# Patient Record
Sex: Male | Born: 1955
Health system: Southern US, Community
[De-identification: ages and names within clinical notes are randomized; demographics above are authoritative.]

## PROBLEM LIST (undated history)

## (undated) DIAGNOSIS — K056 Periodontal disease, unspecified: Secondary | ICD-10-CM

## (undated) DIAGNOSIS — B20 Human immunodeficiency virus [HIV] disease: Secondary | ICD-10-CM

## (undated) DIAGNOSIS — F3289 Other specified depressive episodes: Secondary | ICD-10-CM

## (undated) DIAGNOSIS — K219 Gastro-esophageal reflux disease without esophagitis: Secondary | ICD-10-CM

## (undated) DIAGNOSIS — I214 Non-ST elevation (NSTEMI) myocardial infarction: Secondary | ICD-10-CM

## (undated) DIAGNOSIS — R42 Dizziness and giddiness: Secondary | ICD-10-CM

## (undated) DIAGNOSIS — Z21 Asymptomatic human immunodeficiency virus [HIV] infection status: Secondary | ICD-10-CM

## (undated) DIAGNOSIS — K644 Residual hemorrhoidal skin tags: Secondary | ICD-10-CM

## (undated) DIAGNOSIS — E291 Testicular hypofunction: Secondary | ICD-10-CM

## (undated) DIAGNOSIS — H6121 Impacted cerumen, right ear: Secondary | ICD-10-CM

## (undated) DIAGNOSIS — N529 Male erectile dysfunction, unspecified: Secondary | ICD-10-CM

## (undated) DIAGNOSIS — R350 Frequency of micturition: Secondary | ICD-10-CM

## (undated) DIAGNOSIS — J309 Allergic rhinitis, unspecified: Secondary | ICD-10-CM

## (undated) DIAGNOSIS — R197 Diarrhea, unspecified: Secondary | ICD-10-CM

## (undated) DIAGNOSIS — L509 Urticaria, unspecified: Secondary | ICD-10-CM

## (undated) DIAGNOSIS — F411 Generalized anxiety disorder: Secondary | ICD-10-CM

## (undated) DIAGNOSIS — K648 Other hemorrhoids: Secondary | ICD-10-CM

## (undated) DIAGNOSIS — Z9861 Coronary angioplasty status: Secondary | ICD-10-CM

## (undated) DIAGNOSIS — K449 Diaphragmatic hernia without obstruction or gangrene: Secondary | ICD-10-CM

## (undated) DIAGNOSIS — F329 Major depressive disorder, single episode, unspecified: Secondary | ICD-10-CM

## (undated) DIAGNOSIS — G609 Hereditary and idiopathic neuropathy, unspecified: Secondary | ICD-10-CM

## (undated) DIAGNOSIS — I2510 Atherosclerotic heart disease of native coronary artery without angina pectoris: Secondary | ICD-10-CM

## (undated) DIAGNOSIS — D126 Benign neoplasm of colon, unspecified: Secondary | ICD-10-CM

## (undated) DIAGNOSIS — J029 Acute pharyngitis, unspecified: Secondary | ICD-10-CM

## (undated) DIAGNOSIS — H11002 Unspecified pterygium of left eye: Secondary | ICD-10-CM

## (undated) HISTORY — DX: Urticaria, unspecified: L50.9

## (undated) HISTORY — DX: Unspecified pterygium of left eye: H11.002

## (undated) HISTORY — DX: Testicular hypofunction: E29.1

## (undated) HISTORY — DX: Diaphragmatic hernia without obstruction or gangrene: K44.9

## (undated) HISTORY — DX: Other hemorrhoids: K64.8

## (undated) HISTORY — DX: Atherosclerotic heart disease of native coronary artery without angina pectoris: I25.10

## (undated) HISTORY — DX: Major depressive disorder, single episode, unspecified: F32.9

## (undated) HISTORY — DX: Gastro-esophageal reflux disease without esophagitis: K21.9

## (undated) HISTORY — DX: Asymptomatic human immunodeficiency virus (hiv) infection status: Z21

## (undated) HISTORY — DX: Diarrhea, unspecified: R19.7

## (undated) HISTORY — DX: Dizziness and giddiness: R42

## (undated) HISTORY — DX: Benign neoplasm of colon, unspecified: D12.6

## (undated) HISTORY — DX: Allergic rhinitis, unspecified: J30.9

## (undated) HISTORY — DX: Male erectile dysfunction, unspecified: N52.9

## (undated) HISTORY — DX: Acute pharyngitis, unspecified: J02.9

## (undated) HISTORY — DX: Other specified depressive episodes: F32.89

## (undated) HISTORY — DX: Frequency of micturition: R35.0

## (undated) HISTORY — DX: Impacted cerumen, right ear: H61.21

## (undated) HISTORY — DX: Generalized anxiety disorder: F41.1

## (undated) HISTORY — DX: Hereditary and idiopathic neuropathy, unspecified: G60.9

## (undated) HISTORY — DX: Human immunodeficiency virus (HIV) disease: B20

## (undated) HISTORY — DX: Residual hemorrhoidal skin tags: K64.4

## (undated) HISTORY — DX: Coronary angioplasty status: Z98.61

## (undated) HISTORY — DX: Non-ST elevation (NSTEMI) myocardial infarction: I21.4

## (undated) HISTORY — DX: Periodontal disease, unspecified: K05.6

---

## 1998-08-27 ENCOUNTER — Emergency Department (HOSPITAL_COMMUNITY): Admission: EM | Admit: 1998-08-27 | Discharge: 1998-08-27 | Payer: Self-pay | Admitting: Emergency Medicine

## 2000-07-29 ENCOUNTER — Emergency Department (HOSPITAL_COMMUNITY): Admission: EM | Admit: 2000-07-29 | Discharge: 2000-07-29 | Payer: Self-pay | Admitting: Emergency Medicine

## 2001-11-04 ENCOUNTER — Ambulatory Visit (HOSPITAL_COMMUNITY): Admission: RE | Admit: 2001-11-04 | Discharge: 2001-11-04 | Payer: Self-pay | Admitting: *Deleted

## 2001-11-04 ENCOUNTER — Encounter: Payer: Self-pay | Admitting: *Deleted

## 2002-02-26 ENCOUNTER — Emergency Department (HOSPITAL_COMMUNITY): Admission: EM | Admit: 2002-02-26 | Discharge: 2002-02-26 | Payer: Self-pay | Admitting: Emergency Medicine

## 2002-02-26 ENCOUNTER — Encounter: Payer: Self-pay | Admitting: Emergency Medicine

## 2002-05-19 ENCOUNTER — Ambulatory Visit (HOSPITAL_COMMUNITY): Admission: RE | Admit: 2002-05-19 | Discharge: 2002-05-19 | Payer: Self-pay | Admitting: Internal Medicine

## 2002-05-19 ENCOUNTER — Encounter: Payer: Self-pay | Admitting: Cardiology

## 2002-10-08 ENCOUNTER — Ambulatory Visit (HOSPITAL_COMMUNITY): Admission: RE | Admit: 2002-10-08 | Discharge: 2002-10-08 | Payer: Self-pay | Admitting: Internal Medicine

## 2002-10-08 ENCOUNTER — Encounter: Payer: Self-pay | Admitting: Internal Medicine

## 2003-12-06 ENCOUNTER — Ambulatory Visit: Payer: Self-pay | Admitting: Internal Medicine

## 2003-12-07 ENCOUNTER — Ambulatory Visit: Payer: Self-pay | Admitting: Internal Medicine

## 2003-12-27 ENCOUNTER — Ambulatory Visit: Payer: Self-pay | Admitting: Internal Medicine

## 2004-02-16 ENCOUNTER — Ambulatory Visit: Payer: Self-pay | Admitting: Internal Medicine

## 2004-05-11 ENCOUNTER — Ambulatory Visit: Payer: Self-pay | Admitting: Internal Medicine

## 2004-05-14 ENCOUNTER — Ambulatory Visit: Payer: Self-pay | Admitting: Internal Medicine

## 2004-05-15 ENCOUNTER — Ambulatory Visit: Payer: Self-pay | Admitting: *Deleted

## 2004-07-13 ENCOUNTER — Ambulatory Visit: Payer: Self-pay | Admitting: Internal Medicine

## 2004-08-14 ENCOUNTER — Ambulatory Visit: Payer: Self-pay | Admitting: Internal Medicine

## 2005-05-22 ENCOUNTER — Ambulatory Visit: Payer: Self-pay | Admitting: Internal Medicine

## 2005-06-20 ENCOUNTER — Ambulatory Visit: Payer: Self-pay | Admitting: Internal Medicine

## 2005-08-15 ENCOUNTER — Ambulatory Visit: Payer: Self-pay | Admitting: Internal Medicine

## 2005-12-05 ENCOUNTER — Ambulatory Visit: Payer: Self-pay | Admitting: Internal Medicine

## 2006-04-24 ENCOUNTER — Ambulatory Visit: Payer: Self-pay | Admitting: Internal Medicine

## 2006-07-07 ENCOUNTER — Ambulatory Visit: Payer: Self-pay | Admitting: Internal Medicine

## 2006-08-04 DIAGNOSIS — J309 Allergic rhinitis, unspecified: Secondary | ICD-10-CM | POA: Insufficient documentation

## 2006-08-04 DIAGNOSIS — F32A Depression, unspecified: Secondary | ICD-10-CM | POA: Insufficient documentation

## 2006-08-04 DIAGNOSIS — G609 Hereditary and idiopathic neuropathy, unspecified: Secondary | ICD-10-CM | POA: Insufficient documentation

## 2006-08-04 DIAGNOSIS — R42 Dizziness and giddiness: Secondary | ICD-10-CM | POA: Insufficient documentation

## 2006-08-04 DIAGNOSIS — K219 Gastro-esophageal reflux disease without esophagitis: Secondary | ICD-10-CM | POA: Insufficient documentation

## 2006-08-04 DIAGNOSIS — F419 Anxiety disorder, unspecified: Secondary | ICD-10-CM | POA: Insufficient documentation

## 2006-08-04 DIAGNOSIS — B20 Human immunodeficiency virus [HIV] disease: Secondary | ICD-10-CM | POA: Insufficient documentation

## 2006-08-04 DIAGNOSIS — F329 Major depressive disorder, single episode, unspecified: Secondary | ICD-10-CM

## 2006-11-12 ENCOUNTER — Encounter (INDEPENDENT_AMBULATORY_CARE_PROVIDER_SITE_OTHER): Payer: Self-pay | Admitting: *Deleted

## 2006-12-11 ENCOUNTER — Ambulatory Visit: Payer: Self-pay | Admitting: Internal Medicine

## 2006-12-12 ENCOUNTER — Encounter: Payer: Self-pay | Admitting: Internal Medicine

## 2006-12-12 LAB — CONVERTED CEMR LAB
ALT: 37 units/L (ref 0–53)
AST: 26 units/L (ref 0–37)
Absolute CD4: 637 #/uL (ref 381–1469)
Albumin: 4.7 g/dL (ref 3.5–5.2)
Alkaline Phosphatase: 94 units/L (ref 39–117)
BUN: 12 mg/dL (ref 6–23)
Basophils Absolute: 0 10*3/uL (ref 0.0–0.1)
Basophils Relative: 0 % (ref 0–1)
CD4 T Helper %: 35 % (ref 32–62)
CO2: 20 meq/L (ref 19–32)
Calcium: 8.9 mg/dL (ref 8.4–10.5)
Chloride: 106 meq/L (ref 96–112)
Creatinine, Ser: 0.93 mg/dL (ref 0.40–1.50)
Eosinophils Absolute: 0.2 10*3/uL (ref 0.0–0.7)
Eosinophils Relative: 3 % (ref 0–5)
Glucose, Bld: 87 mg/dL (ref 70–99)
HCT: 47.7 % (ref 39.0–52.0)
HIV 1 RNA Quant: <50 copies/mL (ref <50)
HIV-1 RNA Quant, Log: <1.7 (ref <1.70)
Hemoglobin: 15.8 g/dL (ref 13.0–17.0)
Lymphocytes Relative: 28 % (ref 12–46)
Lymphs Abs: 1.8 10*3/uL (ref 0.7–3.3)
MCHC: 33.1 g/dL (ref 30.0–36.0)
MCV: 96 fL (ref 78.0–100.0)
Monocytes Absolute: 0.5 10*3/uL (ref 0.2–0.7)
Monocytes Relative: 8 % (ref 3–11)
Neutro Abs: 3.9 10*3/uL (ref 1.7–7.7)
Neutrophils Relative %: 60 % (ref 43–77)
Platelets: 194 10*3/uL (ref 150–400)
Potassium: 4.4 meq/L (ref 3.5–5.3)
RBC: 4.97 M/uL (ref 4.22–5.81)
RDW: 13.2 % (ref 11.5–14.0)
Sodium: 139 meq/L (ref 135–145)
Total Bilirubin: 0.3 mg/dL (ref 0.3–1.2)
Total Lymphocytes %: 28 % (ref 12–46)
Total Protein: 7.5 g/dL (ref 6.0–8.3)
Total lymphocyte count: 1820 cells/mcL (ref 700–3300)
WBC, lymph enumeration: 6.5 10*3/uL (ref 4.0–10.5)
WBC: 6.5 10*3/uL (ref 4.0–10.5)

## 2007-03-09 ENCOUNTER — Ambulatory Visit: Payer: Self-pay | Admitting: Internal Medicine

## 2007-03-23 ENCOUNTER — Encounter: Payer: Self-pay | Admitting: Internal Medicine

## 2007-03-23 ENCOUNTER — Ambulatory Visit: Payer: Self-pay | Admitting: Internal Medicine

## 2007-03-23 LAB — CONVERTED CEMR LAB
ALT: 31 units/L (ref 0–53)
AST: 23 units/L (ref 0–37)
Absolute CD4: 696 #/uL (ref 381–1469)
Albumin: 4.7 g/dL (ref 3.5–5.2)
Alkaline Phosphatase: 106 units/L (ref 39–117)
BUN: 19 mg/dL (ref 6–23)
Basophils Absolute: 0 10*3/uL (ref 0.0–0.1)
Basophils Relative: 0 % (ref 0–1)
CD4 T Helper %: 36 % (ref 32–62)
CO2: 24 meq/L (ref 19–32)
Calcium: 9.3 mg/dL (ref 8.4–10.5)
Chloride: 104 meq/L (ref 96–112)
Cholesterol: 186 mg/dL (ref 0–200)
Creatinine, Ser: 0.97 mg/dL (ref 0.40–1.50)
Eosinophils Absolute: 0.3 10*3/uL (ref 0.0–0.7)
Eosinophils Relative: 4 % (ref 0–5)
Glucose, Bld: 94 mg/dL (ref 70–99)
HCT: 47.6 % (ref 39.0–52.0)
HDL: 47 mg/dL (ref >39)
Hemoglobin: 15.4 g/dL (ref 13.0–17.0)
LDL Cholesterol: 115 mg/dL — ABNORMAL HIGH (ref 0–99)
Lymphocytes Relative: 28 % (ref 12–46)
Lymphs Abs: 2 10*3/uL (ref 0.7–4.0)
MCHC: 32.4 g/dL (ref 30.0–36.0)
MCV: 96.9 fL (ref 78.0–100.0)
Monocytes Absolute: 0.6 10*3/uL (ref 0.1–1.0)
Monocytes Relative: 9 % (ref 3–12)
Neutro Abs: 4.1 10*3/uL (ref 1.7–7.7)
Neutrophils Relative %: 59 % (ref 43–77)
Platelets: 196 10*3/uL (ref 150–400)
Potassium: 4 meq/L (ref 3.5–5.3)
RBC: 4.91 M/uL (ref 4.22–5.81)
RDW: 12.8 % (ref 11.5–15.5)
Sodium: 138 meq/L (ref 135–145)
Testosterone: 249.54 ng/dL — ABNORMAL LOW (ref 350–890)
Total Bilirubin: 0.3 mg/dL (ref 0.3–1.2)
Total CHOL/HDL Ratio: 4
Total Lymphocytes %: 28 % (ref 12–46)
Total Protein: 7.5 g/dL (ref 6.0–8.3)
Total lymphocyte count: 1932 cells/mcL (ref 700–3300)
Triglycerides: 121 mg/dL (ref <150)
VLDL: 24 mg/dL (ref 0–40)
WBC, lymph enumeration: 6.9 10*3/uL (ref 4.0–10.5)
WBC: 6.9 10*3/uL (ref 4.0–10.5)

## 2007-03-26 ENCOUNTER — Encounter: Payer: Self-pay | Admitting: Internal Medicine

## 2007-03-26 LAB — CONVERTED CEMR LAB
HIV 1 RNA Quant: <50 copies/mL (ref <50)
HIV-1 RNA Quant, Log: <1.7 (ref <1.70)

## 2007-07-30 ENCOUNTER — Ambulatory Visit: Payer: Self-pay | Admitting: Internal Medicine

## 2007-07-30 ENCOUNTER — Encounter: Payer: Self-pay | Admitting: Internal Medicine

## 2007-07-30 LAB — CONVERTED CEMR LAB
ALT: 29 units/L (ref 0–53)
Absolute CD4: 559 #/uL (ref 381–1469)
BUN: 17 mg/dL (ref 6–23)
Basophils Absolute: 0 10*3/uL (ref 0.0–0.1)
CD4 T Helper %: 37 % (ref 32–62)
CO2: 22 meq/L (ref 19–32)
Calcium: 10 mg/dL (ref 8.4–10.5)
Chloride: 111 meq/L (ref 96–112)
Creatinine, Ser: 0.99 mg/dL (ref 0.40–1.50)
Eosinophils Relative: 3 % (ref 0–5)
Glucose, Bld: 97 mg/dL (ref 70–99)
Hep B S Ab: POSITIVE — AB
Hepatitis B Surface Ag: NEGATIVE
MCHC: 33.5 g/dL (ref 30.0–36.0)
MCV: 94.8 fL (ref 78.0–100.0)
Monocytes Absolute: 0.5 10*3/uL (ref 0.1–1.0)
Monocytes Relative: 8 % (ref 3–12)
Neutrophils Relative %: 65 % (ref 43–77)
PSA: 1.66 ng/mL (ref 0.10–4.00)
Platelets: 171 10*3/uL (ref 150–400)
Testosterone: 249.75 ng/dL — ABNORMAL LOW (ref 350–890)
Total Bilirubin: 0.3 mg/dL (ref 0.3–1.2)
WBC, lymph enumeration: 6.3 10*3/uL (ref 4.0–10.5)
WBC: 6.3 10*3/uL (ref 4.0–10.5)

## 2007-09-10 ENCOUNTER — Ambulatory Visit: Payer: Self-pay | Admitting: Internal Medicine

## 2007-12-10 ENCOUNTER — Ambulatory Visit: Payer: Self-pay | Admitting: Internal Medicine

## 2007-12-10 LAB — CONVERTED CEMR LAB
ALT: 26 units/L (ref 0–53)
Absolute CD4: 638 #/uL (ref 381–1469)
Basophils Absolute: 0 10*3/uL (ref 0.0–0.1)
CO2: 24 meq/L (ref 19–32)
Calcium: 9.3 mg/dL (ref 8.4–10.5)
Chloride: 106 meq/L (ref 96–112)
Creatinine, Ser: 0.94 mg/dL (ref 0.40–1.50)
Eosinophils Relative: 3 % (ref 0–5)
Glucose, Bld: 92 mg/dL (ref 70–99)
HCT: 45.1 % (ref 39.0–52.0)
HIV 1 RNA Quant: <50 copies/mL (ref <50)
Hemoglobin: 15.1 g/dL (ref 13.0–17.0)
Lymphocytes Relative: 23 % (ref 12–46)
Lymphs Abs: 1.7 10*3/uL (ref 0.7–4.0)
Monocytes Absolute: 0.6 10*3/uL (ref 0.1–1.0)
Neutro Abs: 4.8 10*3/uL (ref 1.7–7.7)
PSA: 2.19 ng/mL (ref 0.10–4.00)
RBC: 4.65 M/uL (ref 4.22–5.81)
RDW: 12.6 % (ref 11.5–15.5)
Total Bilirubin: 0.3 mg/dL (ref 0.3–1.2)
Total Protein: 7.5 g/dL (ref 6.0–8.3)
WBC, lymph enumeration: 7.3 10*3/uL (ref 4.0–10.5)
WBC: 7.3 10*3/uL (ref 4.0–10.5)

## 2008-07-01 ENCOUNTER — Telehealth: Payer: Self-pay | Admitting: Internal Medicine

## 2008-09-15 ENCOUNTER — Encounter: Payer: Self-pay | Admitting: Internal Medicine

## 2008-09-15 ENCOUNTER — Ambulatory Visit: Payer: Self-pay | Admitting: Internal Medicine

## 2008-09-15 DIAGNOSIS — K644 Residual hemorrhoidal skin tags: Secondary | ICD-10-CM | POA: Insufficient documentation

## 2008-09-15 DIAGNOSIS — R197 Diarrhea, unspecified: Secondary | ICD-10-CM | POA: Insufficient documentation

## 2008-10-06 ENCOUNTER — Encounter: Payer: Self-pay | Admitting: Internal Medicine

## 2008-10-06 ENCOUNTER — Ambulatory Visit: Payer: Self-pay | Admitting: Internal Medicine

## 2008-10-06 DIAGNOSIS — J029 Acute pharyngitis, unspecified: Secondary | ICD-10-CM | POA: Insufficient documentation

## 2008-10-20 ENCOUNTER — Ambulatory Visit: Payer: Self-pay | Admitting: Internal Medicine

## 2008-11-09 ENCOUNTER — Ambulatory Visit: Payer: Self-pay | Admitting: Internal Medicine

## 2008-12-15 ENCOUNTER — Telehealth: Payer: Self-pay | Admitting: Internal Medicine

## 2009-01-17 ENCOUNTER — Ambulatory Visit: Payer: Self-pay | Admitting: Internal Medicine

## 2009-03-16 ENCOUNTER — Encounter: Payer: Self-pay | Admitting: Internal Medicine

## 2009-03-16 ENCOUNTER — Ambulatory Visit: Payer: Self-pay | Admitting: Internal Medicine

## 2009-03-16 DIAGNOSIS — R350 Frequency of micturition: Secondary | ICD-10-CM | POA: Insufficient documentation

## 2009-03-20 LAB — CONVERTED CEMR LAB
ALT: 47 units/L (ref 0–53)
Albumin: 4.8 g/dL (ref 3.5–5.2)
CD4 T Helper %: 37 % (ref 32–62)
CO2: 23 meq/L (ref 19–32)
Calcium: 9.1 mg/dL (ref 8.4–10.5)
Chlamydia, Swab/Urine, PCR: NEGATIVE
Chloride: 107 meq/L (ref 96–112)
Cholesterol: 201 mg/dL — ABNORMAL HIGH (ref 0–200)
HIV 1 RNA Quant: <48 copies/mL (ref <48)
HIV-1 RNA Quant, Log: <1.68 (ref <1.68)
Hemoglobin: 16.4 g/dL (ref 13.0–17.0)
Lymphocytes Relative: 26 % (ref 12–46)
Lymphs Abs: 1.7 10*3/uL (ref 0.7–4.0)
Monocytes Absolute: 0.6 10*3/uL (ref 0.1–1.0)
Monocytes Relative: 10 % (ref 3–12)
Neutro Abs: 4 10*3/uL (ref 1.7–7.7)
Neutrophils Relative %: 62 % (ref 43–77)
PSA: 2.13 ng/mL (ref 0.10–4.00)
Potassium: 4.6 meq/L (ref 3.5–5.3)
RBC: 5.17 M/uL (ref 4.22–5.81)
Sodium: 141 meq/L (ref 135–145)
Total Bilirubin: 0.3 mg/dL (ref 0.3–1.2)
Total Protein: 7.8 g/dL (ref 6.0–8.3)
VLDL: 18 mg/dL (ref 0–40)
WBC: 6.5 10*3/uL (ref 4.0–10.5)

## 2009-03-21 ENCOUNTER — Telehealth: Payer: Self-pay | Admitting: Internal Medicine

## 2009-03-23 ENCOUNTER — Ambulatory Visit: Payer: Self-pay | Admitting: Internal Medicine

## 2009-03-24 ENCOUNTER — Encounter: Payer: Self-pay | Admitting: Internal Medicine

## 2009-04-05 ENCOUNTER — Telehealth: Payer: Self-pay | Admitting: Internal Medicine

## 2009-05-10 ENCOUNTER — Ambulatory Visit: Payer: Self-pay | Admitting: Internal Medicine

## 2009-05-11 ENCOUNTER — Ambulatory Visit (HOSPITAL_COMMUNITY): Admission: RE | Admit: 2009-05-11 | Discharge: 2009-05-11 | Payer: Self-pay | Admitting: Internal Medicine

## 2009-05-16 ENCOUNTER — Ambulatory Visit (HOSPITAL_COMMUNITY): Admission: RE | Admit: 2009-05-16 | Discharge: 2009-05-16 | Payer: Self-pay | Admitting: Internal Medicine

## 2009-05-16 IMAGING — CR DG CHEST 2V
3 series · 3 of 3 positions shown · non-contrast
Comparison: Chest [DATE].

CLINICAL DATA: Question pulmonary nodule.

CHEST - 2 VIEW

[w chest pa]
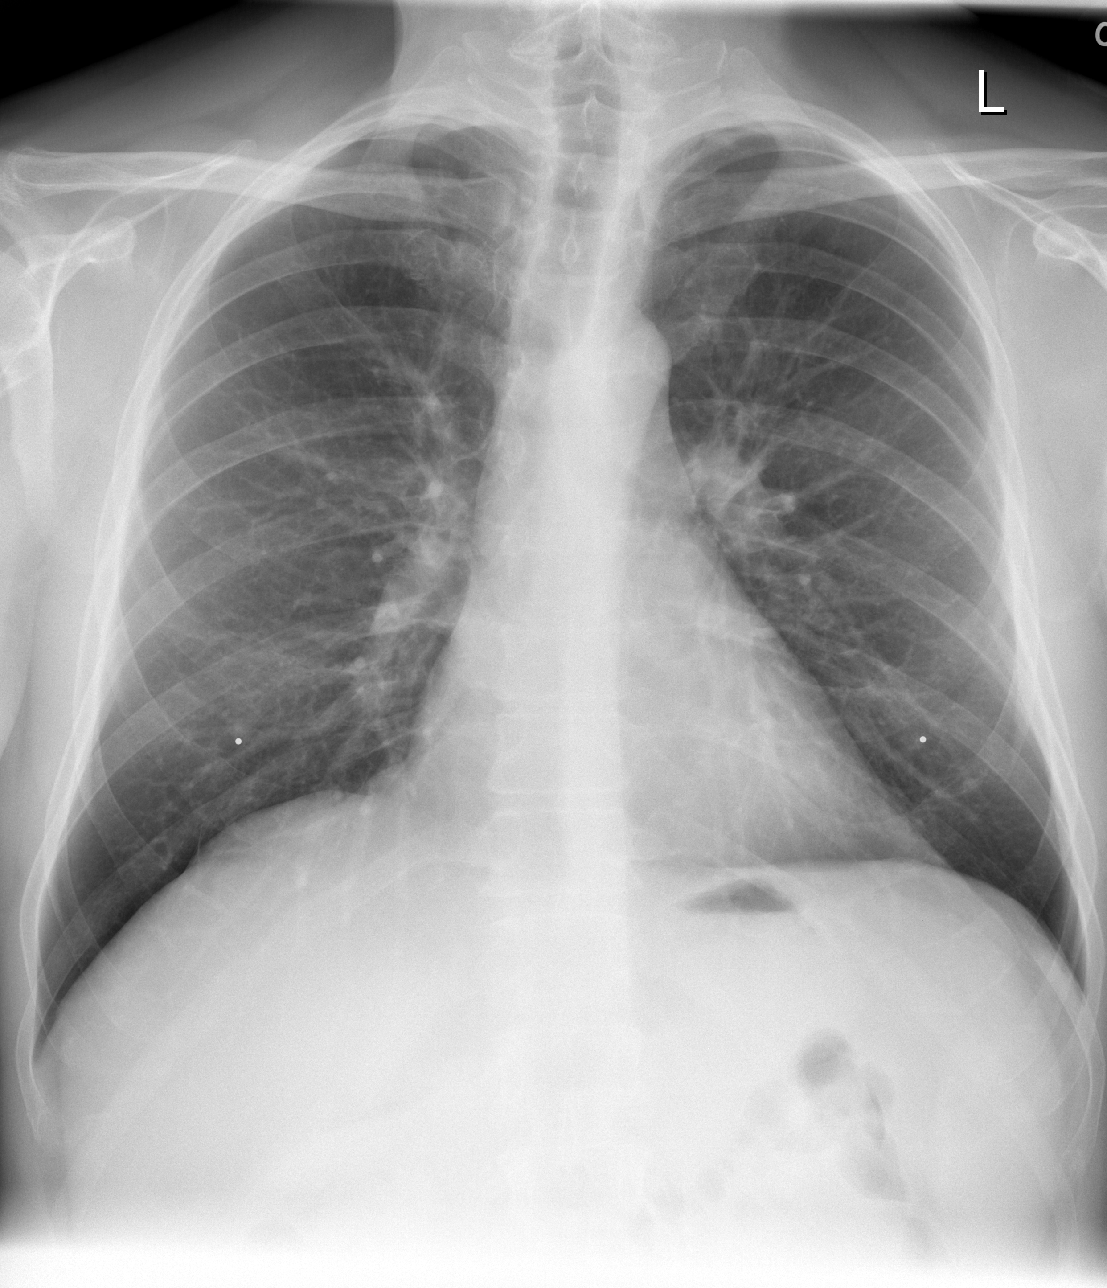

[w chest lat (1 of 2)]
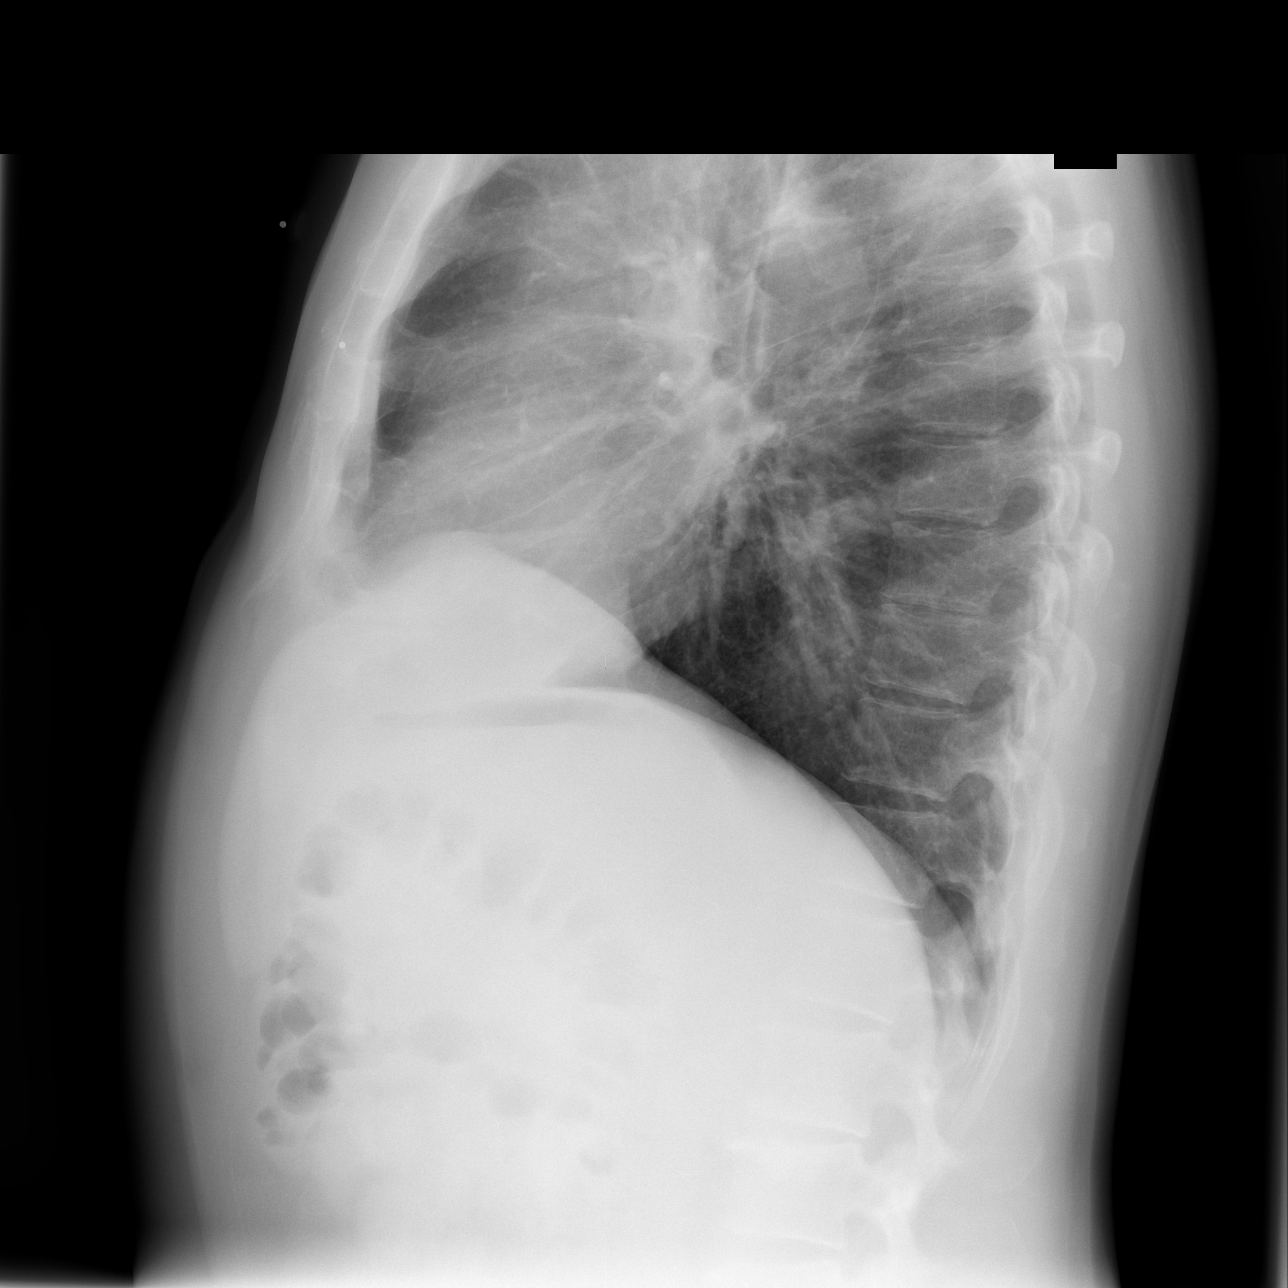

[w chest lat (2 of 2)]
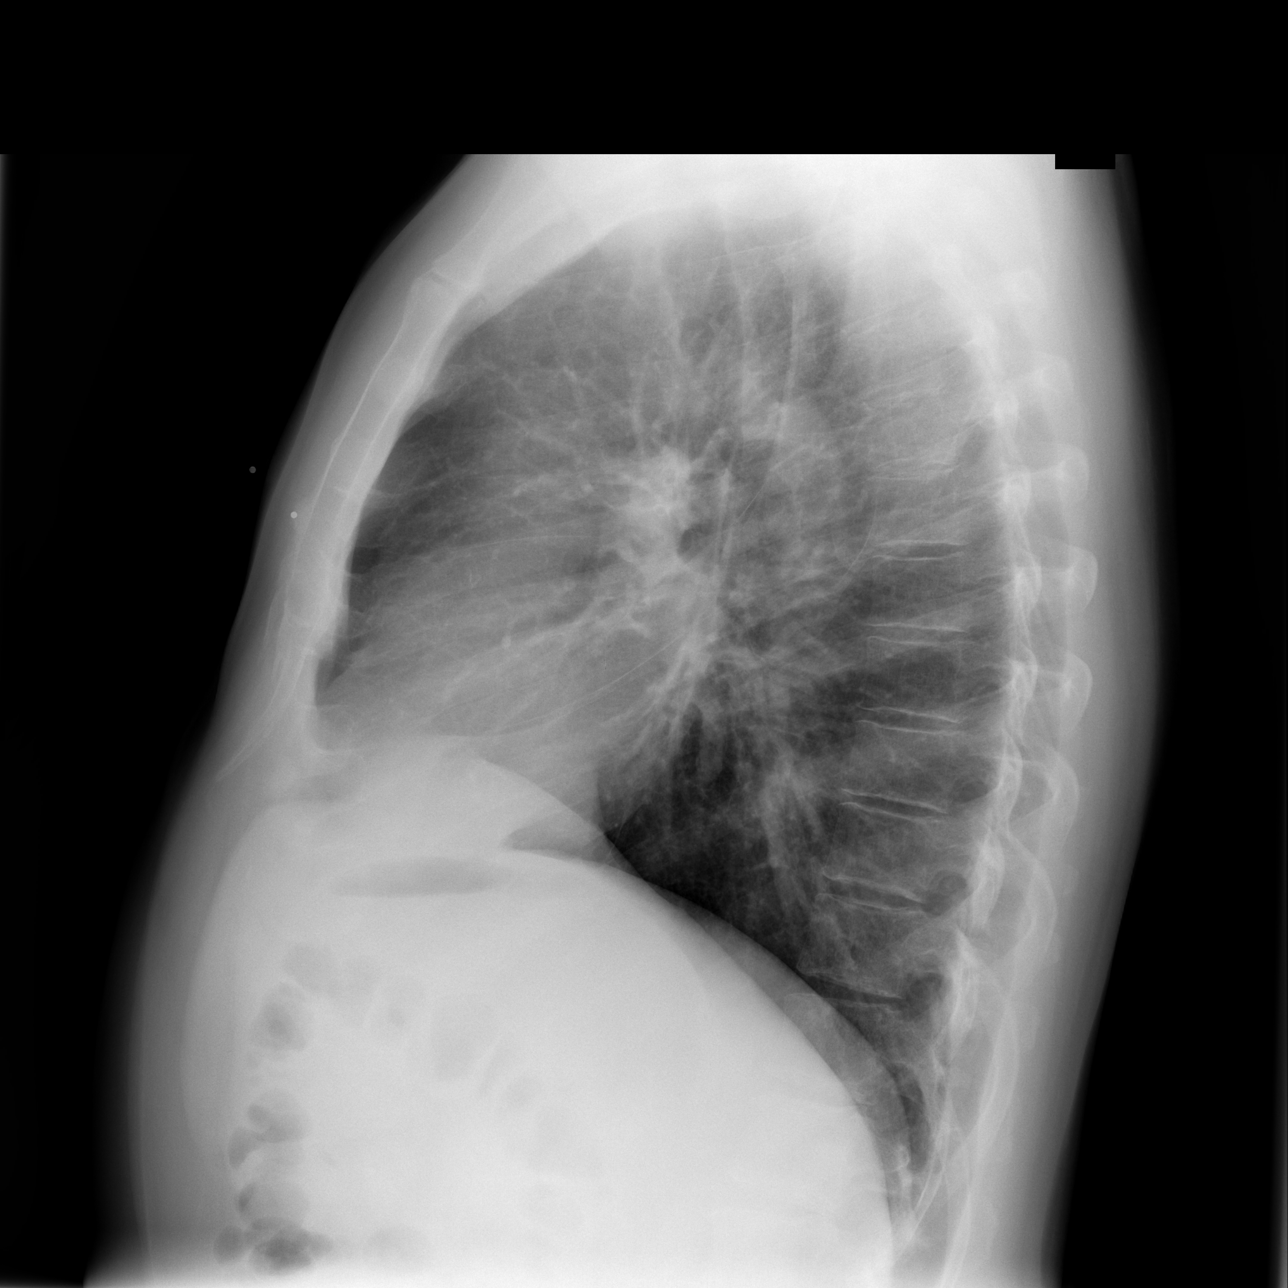

[3 of 3 positions shown; findings below may reference images not displayed]

FINDINGS: Repeat films taken with nipple marker in place.  Nodular
opacities seen on prior plain films are the patient's nipples.  No
pulmonary nodule is identified.
IMPRESSION: Negative for pulmonary nodule.

## 2009-06-08 ENCOUNTER — Telehealth (INDEPENDENT_AMBULATORY_CARE_PROVIDER_SITE_OTHER): Payer: Self-pay | Admitting: *Deleted

## 2009-07-05 ENCOUNTER — Telehealth: Payer: Self-pay | Admitting: Internal Medicine

## 2009-07-17 ENCOUNTER — Encounter (INDEPENDENT_AMBULATORY_CARE_PROVIDER_SITE_OTHER): Payer: Self-pay | Admitting: *Deleted

## 2009-08-14 ENCOUNTER — Telehealth: Payer: Self-pay | Admitting: Internal Medicine

## 2009-08-31 ENCOUNTER — Telehealth: Payer: Self-pay | Admitting: Internal Medicine

## 2009-09-01 ENCOUNTER — Encounter: Payer: Self-pay | Admitting: Internal Medicine

## 2009-09-06 ENCOUNTER — Ambulatory Visit: Payer: Self-pay | Admitting: Internal Medicine

## 2009-09-06 LAB — CONVERTED CEMR LAB
ALT: 20 units/L (ref 0–53)
AST: 17 units/L (ref 0–37)
Basophils Absolute: 0 10*3/uL (ref 0.0–0.1)
Basophils Relative: 0 % (ref 0–1)
Chloride: 106 meq/L (ref 96–112)
Creatinine, Ser: 1.01 mg/dL (ref 0.40–1.50)
Eosinophils Relative: 3 % (ref 0–5)
Hemoglobin: 14.8 g/dL (ref 13.0–17.0)
LDL Cholesterol: 101 mg/dL — ABNORMAL HIGH (ref 0–99)
MCHC: 32.7 g/dL (ref 30.0–36.0)
Monocytes Absolute: 0.6 10*3/uL (ref 0.1–1.0)
Neutro Abs: 3.5 10*3/uL (ref 1.7–7.7)
RDW: 13.4 % (ref 11.5–15.5)
Testosterone: 299.75 ng/dL — ABNORMAL LOW (ref 350–890)
Total Bilirubin: 0.3 mg/dL (ref 0.3–1.2)
Total CHOL/HDL Ratio: 4.3
VLDL: 32 mg/dL (ref 0–40)

## 2009-09-20 ENCOUNTER — Ambulatory Visit: Payer: Self-pay | Admitting: Internal Medicine

## 2009-09-20 DIAGNOSIS — F411 Generalized anxiety disorder: Secondary | ICD-10-CM | POA: Insufficient documentation

## 2009-09-20 DIAGNOSIS — E349 Endocrine disorder, unspecified: Secondary | ICD-10-CM | POA: Insufficient documentation

## 2009-09-26 ENCOUNTER — Telehealth: Payer: Self-pay | Admitting: Internal Medicine

## 2009-10-27 ENCOUNTER — Telehealth: Payer: Self-pay | Admitting: Internal Medicine

## 2009-11-29 ENCOUNTER — Telehealth (INDEPENDENT_AMBULATORY_CARE_PROVIDER_SITE_OTHER): Payer: Self-pay | Admitting: *Deleted

## 2009-12-26 ENCOUNTER — Telehealth (INDEPENDENT_AMBULATORY_CARE_PROVIDER_SITE_OTHER): Payer: Self-pay | Admitting: *Deleted

## 2010-01-15 ENCOUNTER — Encounter (INDEPENDENT_AMBULATORY_CARE_PROVIDER_SITE_OTHER): Payer: Self-pay | Admitting: *Deleted

## 2010-01-25 ENCOUNTER — Telehealth (INDEPENDENT_AMBULATORY_CARE_PROVIDER_SITE_OTHER): Payer: Self-pay | Admitting: *Deleted

## 2010-03-08 ENCOUNTER — Ambulatory Visit: Admit: 2010-03-08 | Payer: Self-pay | Admitting: Internal Medicine

## 2010-03-20 ENCOUNTER — Telehealth: Payer: Self-pay | Admitting: *Deleted

## 2010-03-27 ENCOUNTER — Ambulatory Visit
Admission: RE | Admit: 2010-03-27 | Discharge: 2010-03-27 | Payer: Self-pay | Source: Home / Self Care | Attending: Internal Medicine | Admitting: Internal Medicine

## 2010-03-27 ENCOUNTER — Encounter: Payer: Self-pay | Admitting: Adult Health

## 2010-03-27 LAB — CONVERTED CEMR LAB
ALT: 36 units/L (ref 0–53)
BUN: 18 mg/dL (ref 6–23)
CO2: 25 meq/L (ref 19–32)
Calcium: 9.3 mg/dL (ref 8.4–10.5)
Chloride: 104 meq/L (ref 96–112)
Creatinine, Ser: 0.94 mg/dL (ref 0.40–1.50)
Eosinophils Relative: 2 % (ref 0–5)
HCT: 46.5 % (ref 39.0–52.0)
HIV-1 RNA Quant, Log: <1.3 (ref <1.30)
Hemoglobin: 15.2 g/dL (ref 13.0–17.0)
Lymphocytes Relative: 29 % (ref 12–46)
Lymphs Abs: 1.9 10*3/uL (ref 0.7–4.0)
Neutro Abs: 3.9 10*3/uL (ref 1.7–7.7)
Platelets: 181 10*3/uL (ref 150–400)
Testosterone: 248.71 ng/dL — ABNORMAL LOW (ref 250–890)
WBC: 6.5 10*3/uL (ref 4.0–10.5)

## 2010-03-29 NOTE — Progress Notes (Signed)
Summary: [facility]AP/pt assist meds arrived for Jul  Phone Note Refill Request      Prescriptions: SUSTIVA 600 MG TABS (EFAVIRENZ) Take 1 tablet by mouth at bedtime  #30 x 0   Entered by:   Paulo Fruit  BS,CPht II,MPH   Authorized by:   Yisroel Ramming MD   Signed by:   Paulo Fruit  BS,CPht II,MPH on 08/31/2009   Method used:   Samples Given   RxID:   [id number] VIREAD 300 MG TABS (TENOFOVIR DISOPROXIL FUMARATE) Take 1 tablet by mouth once a day  #30 x 0   Entered by:   Paulo Fruit  BS,CPht II,MPH   Authorized by:   Yisroel Ramming MD   Signed by:   Paulo Fruit  BS,CPht II,MPH on 08/31/2009   Method used:   Samples Given   RxID:   [id number] EPIVIR 300 MG TABS (LAMIVUDINE) Take 1 tablet by mouth once a day  #30 x 0   Entered by:   Paulo Fruit  BS,CPht II,MPH   Authorized by:   Yisroel Ramming MD   Signed by:   Paulo Fruit  BS,CPht II,MPH on 08/31/2009   Method used:   Samples Given   RxID:   [id number]  Patient Assist Medication Verification: Medication name:Epivir 300mg  RX # [id number] Tech approval:MLD  Patient Assist Medication Verification: Medication name:Viread 300mg  RX # [id number] Tech approval:MLD  Patient Assist Medication Verification: Medication name: Sustiva 600mg  RX # [id number] Tech approval:MLD Call placed to patient with message that assistance medications are ready for pick-up. Paulo Fruit  BS,CPht II,MPH  August 31, 2009 10:49 AM

## 2010-03-29 NOTE — Progress Notes (Signed)
Summary: NCADAP meds arrived for Aug  Phone Note Refill Request      Prescriptions: SUSTIVA 600 MG TABS (EFAVIRENZ) Take 1 tablet by mouth at bedtime  #30 x 0   Entered by:   Paulo Fruit  BS,CPht II,MPH   Authorized by:   Yisroel Ramming MD   Signed by:   Paulo Fruit  BS,CPht II,MPH on 09/26/2009   Method used:   Samples Given   RxID:   [id number] VIREAD 300 MG TABS (TENOFOVIR DISOPROXIL FUMARATE) Take 1 tablet by mouth once a day  #30 x 0   Entered by:   Paulo Fruit  BS,CPht II,MPH   Authorized by:   Yisroel Ramming MD   Signed by:   Paulo Fruit  BS,CPht II,MPH on 09/26/2009   Method used:   Samples Given   RxID:   [id number] EPIVIR 300 MG TABS (LAMIVUDINE) Take 1 tablet by mouth once a day  #30 x 0   Entered by:   Paulo Fruit  BS,CPht II,MPH   Authorized by:   Yisroel Ramming MD   Signed by:   Paulo Fruit  BS,CPht II,MPH on 09/26/2009   Method used:   Samples Given   RxID:   [id number]  Patient Assist Medication Verification: Medication name: Viread 300mg  RX # [id number] Tech approval:MLD  Patient Assist Medication Verification: Medication name:Sustiva 600mg  RX # [id number] Tech approval:MLD  Patient Assist Medication Verification: Medication name:Epivir 300mg  RX # [id number] Tech approval:MLD Call placed to patient with message that assistance medications are ready for pick-up. Paulo Fruit  BS,CPht II,MPH  September 26, 2009 2:50 PM

## 2010-03-29 NOTE — Progress Notes (Signed)
Summary: phone call re: labs  Phone Note Outgoing Call   Summary of Call: ML on VM to call. 530p Pt. called and advised of Testosterone level. Pt. requested results of UA from last week and none found. Pt. will come in on 1/27 at 330 pm at no charge for a UA. Initial call taken by: Sharen Heck RN,  March 21, 2009 1:17 PM

## 2010-03-29 NOTE — Progress Notes (Signed)
   Phone Note Outgoing Call   Call placed by: Golden Circle RN,  March 20, 2010 1:46 PM Call placed to: pt Action Taken: Assistance medications ready for pick up    Prescriptions: SUSTIVA 600 MG TABS (EFAVIRENZ) Take 1 tablet by mouth at bedtime  #30 x 0   Entered by:   Golden Circle RN   Authorized by:   Johny Sax MD   Signed by:   Golden Circle RN on 03/20/2010   Method used:   Samples Given   RxID:   [id number] VIREAD 300 MG TABS (TENOFOVIR DISOPROXIL FUMARATE) Take 1 tablet by mouth once a day  #30 x 0   Entered by:   Golden Circle RN   Authorized by:   Johny Sax MD   Signed by:   Golden Circle RN on 03/20/2010   Method used:   Samples Given   RxID:   [id number] EPIVIR 300 MG TABS (LAMIVUDINE) Take 1 tablet by mouth once a day  #30 x 0   Entered by:   Golden Circle RN   Authorized by:   Johny Sax MD   Signed by:   Golden Circle RN on 03/20/2010   Method used:   Samples Given   RxID:   [id number]  spoke with pt. he will pick up meds at end of week. told him that [facility] will have all meds in future. he just needs to call a wk ahead & pick them up there.Golden Circle RN  March 20, 2010 1:48 PM

## 2010-03-29 NOTE — Miscellaneous (Signed)
Summary: Orders Update - Labs  Clinical Lists Changes  Problems: Added new problem of ENCOUNTER FOR LONG-TERM USE OF OTHER MEDICATIONS (ICD-V58.69) Orders: Added new Test order of T-CBC w/Diff [phone]) - Signed Added new Test order of T-CD4SP [facility]) (CD4SP) - Signed Added new Test order of T-Comprehensive Metabolic Panel [phone]) - Signed Added new Test order of T-HIV Viral Load [phone]) - Signed Added new Test order of T-RPR (Syphilis) [phone]) - Signed Added new Test order of T-Lipid Profile ([id number]) - Signed     Process Orders Check Orders Results:     [facility]: ABN not required for this insurance Order queued for requisitioning for [facility]: September 01, 2009 4:21 PM  Tests Sent for requisitioning (September 01, 2009 4:21 PM):     09/06/2009: [facility] -- T-CBC w/Diff [[id number]] (signed)     09/06/2009: [facility] -- T-Comprehensive Metabolic Panel [[id number]] (signed)     09/06/2009: [facility] -- T-HIV Viral Load [id number] (signed)     09/06/2009: [facility] -- T-RPR (Syphilis) [phone] (signed)     09/06/2009: [facility] -- T-Lipid Profile [phone] (signed)

## 2010-03-29 NOTE — Progress Notes (Signed)
Summary: Pt. notified that ADAP are ready for pick up  Phone Note Outgoing Call   Call placed by: Jennet Maduro RN,  January 25, 2010 2:15 PM Call placed to: Patient Action Taken: Assistance medications ready for pick up Summary of Call: Pt. notified that ADAP are ready for pick up. Jennet Maduro RN  January 25, 2010 2:16 PM     Prescriptions: SUSTIVA 600 MG TABS (EFAVIRENZ) Take 1 tablet by mouth at bedtime  #30 x 0   Entered by:   Jennet Maduro RN   Authorized by:   Yisroel Ramming MD   Signed by:   Jennet Maduro RN on 01/25/2010   Method used:   Samples Given   RxID:   [id number] VIREAD 300 MG TABS (TENOFOVIR DISOPROXIL FUMARATE) Take 1 tablet by mouth once a day  #30 x 0   Entered by:   Jennet Maduro RN   Authorized by:   Yisroel Ramming MD   Signed by:   Jennet Maduro RN on 01/25/2010   Method used:   Samples Given   RxID:   [id number] EPIVIR 300 MG TABS (LAMIVUDINE) Take 1 tablet by mouth once a day  #30 x 0   Entered by:   Jennet Maduro RN   Authorized by:   Yisroel Ramming MD   Signed by:   Jennet Maduro RN on 01/25/2010   Method used:   Samples Given   RxID:   [id number]

## 2010-03-29 NOTE — Miscellaneous (Signed)
Summary: Office Visit ([facility])    Vital Signs:  Patient profile:   55 year old male Weight:      178.5 pounds Temp:     97.9 degrees F oral Pulse rate:   73 / minute Pulse rhythm:   regular Resp:     18 per minute BP sitting:   131 / 85  (left arm)  Vitals Entered By: Sharen Heck RN (March 16, 2009 10:10 AM) CC: / f/u 05, feels swimmy headed today Is Patient Diabetic? No Pain Assessment Patient in pain? no       Does patient need assistance? Functional Status Self care Ambulation Normal   CC:  / f/u 05 and feels swimmy headed today.  History of Present Illness: Pt continues to have GERD, blood in his stool which he thinks is due to hemorrhoids and intermittant diarrhea.  He is ready to be referred to GI. He also c/o some burning on urination and burning in his urethra.  He has not had unprotected sex. Taking his meds every day.  Preventive Screening-Counseling & Management  Alcohol-Tobacco     Alcohol drinks/day: 0     Smoking Status: quit  Caffeine-Diet-Exercise     Caffeine use/day: 0     Does Patient Exercise: yes     Type of exercise: yard work     Exercise (avg: min/session): 30-60     Times/week: 3  Current Problems (verified): 1)  Pharyngitis, Recurrent  (ICD-462) 2)  Diarrhea  (ICD-787.91) 3)  External Hemorrhoids Without Mention Comp  (ICD-455.3) 4)  Vertigo  (ICD-780.4) 5)  Depression  (ICD-311) 6)  Peripheral Neuropathy  (ICD-356.9) 7)  HIV Disease  (ICD-042) 8)  Gerd  (ICD-530.81) 9)  Allergic Rhinitis  (ICD-477.9)  Current Medications (verified): 1)  Epivir 300 Mg Tabs (Lamivudine) .... Take 1 Tablet By Mouth Once A Day 2)  Viread 300 Mg Tabs (Tenofovir Disoproxil Fumarate) .... Take 1 Tablet By Mouth Once A Day 3)  Sustiva 600 Mg Tabs (Efavirenz) .... Take 1 Tablet By Mouth At Bedtime 4)  Protonix 40 Mg Tbec (Pantoprazole Sodium) .... Take 1 Tablet By Mouth Once A Day 5)  Allegra 180 Mg Tabs (Fexofenadine Hcl) 6)  Anusol-Hc 25  Mg Supp (Hydrocortisone Acetate) .... One Per Rectum Once Daily  Allergies: 1)  ! Sulfa 2)  ! * Dapsone 3)  ! Codeine 4)  ! Cipro   Review of Systems  The patient denies anorexia, fever, weight loss, abdominal pain, and melena.     Physical Exam  General:  alert, well-developed, well-nourished, and well-hydrated.   Head:  normocephalic and atraumatic.   Mouth:  pharynx pink and moist.  no thrush     Impression & Recommendations:  Problem # 1:  HIV DISEASE (ICD-042) Will obtain labs today.  Pt to continue current meds. Influenza vaccine given. Diagnostics Reviewed:  WBC: 7.3 (12/10/2007)   Hgb: 15.1 (12/10/2007)   HCT: 45.1 (12/10/2007)   Platelets: 223 (12/10/2007) HIV-1 RNA: <50 copies/mL (12/10/2007)   HBSAg: NEG (07/30/2007)  Orders: Est. Patient Level III (78295) T-CBC w/Diff [phone]) T-CD4SP ([facility]) (CD4SP) T-Chlamydia  Probe, urine [phone]) T-GC Probe, urine [phone]) T-Comprehensive Metabolic Panel [phone]) T-HIV Viral Load (74259-56387) T-Testosterone; Total [phone]) T-Urinalysis [phone]) T-PSA [phone]) T-Lipid Profile [phone])  Problem # 2:  DIARRHEA (ICD-787.91) refer to GI.  Problem # 3:  URINARY FREQUENCY (ICD-788.41) check UA and  gc, chlamydia

## 2010-03-29 NOTE — Miscellaneous (Signed)
  Clinical Lists Changes  Observations: Added new observation of YEARAIDSPOS: 2004  (01/15/2010 10:41)

## 2010-03-29 NOTE — Progress Notes (Signed)
Summary: med refill  Phone Note Refill Request Message from:  Fax from Pharmacy on April 05, 2009 4:39 PM  Refills Requested: Medication #1:  ANUSOL-HC 25 MG SUPP one per rectum once daily.   Dosage confirmed as above?Dosage Confirmed   Brand Name Necessary? No   Supply Requested: 1 month  Method Requested: Telephone to Pharmacy Initial call taken by: Sharen Heck RN,  April 05, 2009 4:39 PM    Prescriptions: ANUSOL-HC 25 MG SUPP (HYDROCORTISONE ACETATE) one per rectum once daily  #30 x 4   Entered by:   Sharen Heck RN   Authorized by:   Yisroel Ramming MD   Signed by:   Yisroel Ramming MD on 04/06/2009   Method used:   Telephoned to ...       [facility] (retail)       [address] [place], [place]  [address]       Ph: [phone]       Fax: [phone]   RxID:   [id number]

## 2010-03-29 NOTE — Miscellaneous (Signed)
Summary: Orders Update  Clinical Lists Changes  Orders: Added new Test order of T-Testosterone; Total [phone]) - Signed     Process Orders Check Orders Results:     [facility]: ABN not required for this insurance Order queued for requisitioning for [facility]: September 06, 2009 4:00 PM  Tests Sent for requisitioning (September 06, 2009 4:00 PM):     09/06/2009: [facility] -- T-Testosterone; Total [phone] (signed)

## 2010-03-29 NOTE — Miscellaneous (Signed)
Summary: clinical update/ryan white  Clinical Lists Changes  Observations: Added new observation of RWTITLE: B (07/17/2009 16:06) Added new observation of PAYOR: No Insurance (07/17/2009 16:06) Added new observation of AIDSDAP: Yes 2011 (07/17/2009 16:06) Added new observation of PCTFPL: 177.65  (07/17/2009 16:06) Added new observation of INCOMESOURCE: WAGES  (07/17/2009 16:06) Added new observation of HOUSEINCOME: [address]  (07/17/2009 16:06) Added new observation of #CHILD<18 IN: No  (07/17/2009 16:06) Added new observation of FAMILYSIZE: 1  (07/17/2009 16:06) Added new observation of HOUSING: Stable/permanent  (07/17/2009 16:06) Added new observation of FINASSESSDT: 07/17/2009  (07/17/2009 16:06) Added new observation of YEARLYEXPEN: 2100  (07/17/2009 16:06) Added new observation of GENDER: Male  (07/17/2009 16:06) Added new observation of MARITAL STAT: Single  (07/17/2009 16:06) Added new observation of LATINO/HISP: No  (07/17/2009 16:06) Added new observation of RACE: White  (07/17/2009 16:06) Added new observation of REC_MESSAGE: Yes  (07/17/2009 16:06) Added new observation of RECPHONECALL: Yes  (07/17/2009 16:06) Added new observation of REC_MAIL: Yes  (07/17/2009 16:06) Added new observation of RW VITAL STA: Active  (07/17/2009 16:06) Added new observation of PATNTCOUNTY: [place]  (07/17/2009 16:06) Added new observation of RWPARTICIP: Yes  (07/17/2009 16:06)

## 2010-03-29 NOTE — Progress Notes (Signed)
Summary: ncadap meds arrived for Sept.--left msg for pt to call office  Phone Note Refill Request      Prescriptions: SUSTIVA 600 MG TABS (EFAVIRENZ) Take 1 tablet by mouth at bedtime  #30 x 0   Entered by:   Paulo Fruit  BS,CPht II,MPH   Authorized by:   Yisroel Ramming MD   Signed by:   Paulo Fruit  BS,CPht II,MPH on 10/27/2009   Method used:   Samples Given   RxID:   [id number] VIREAD 300 MG TABS (TENOFOVIR DISOPROXIL FUMARATE) Take 1 tablet by mouth once a day  #30 x 0   Entered by:   Paulo Fruit  BS,CPht II,MPH   Authorized by:   Yisroel Ramming MD   Signed by:   Paulo Fruit  BS,CPht II,MPH on 10/27/2009   Method used:   Samples Given   RxID:   [id number] EPIVIR 300 MG TABS (LAMIVUDINE) Take 1 tablet by mouth once a day  #30 x 0   Entered by:   Paulo Fruit  BS,CPht II,MPH   Authorized by:   Yisroel Ramming MD   Signed by:   Paulo Fruit  BS,CPht II,MPH on 10/27/2009   Method used:   Samples Given   RxID:   [id number]  Patient Assist Medication Verification: Medication name: Brennan Bailey 600mg  RX # [id number] Tech approval:MLD  Patient Assist Medication Verification: Medication name:Epivir 300mg  RX # [id number] Tech approval:MLD  Patient Assist Medication Verification: Medication name:Viread 300mg  RX # [id number] Tech approval:MLD Call placed to patient with message that assistance medications are ready for pick-up. Left message on pt's voicemail to contact office Paulo Fruit  BS,CPht II,MPH  October 27, 2009 9:41 AM

## 2010-03-29 NOTE — Progress Notes (Signed)
Summary: NCADAP/pt assist meds arrived for May  Phone Note Refill Request      Prescriptions: SUSTIVA 600 MG TABS (EFAVIRENZ) Take 1 tablet by mouth at bedtime  #30 x 0   Entered by:   Paulo Fruit  BS,CPht II,MPH   Authorized by:   Yisroel Ramming MD   Signed by:   Paulo Fruit  BS,CPht II,MPH on 07/05/2009   Method used:   Samples Given   RxID:   [id number] VIREAD 300 MG TABS (TENOFOVIR DISOPROXIL FUMARATE) Take 1 tablet by mouth once a day  #30 x 0   Entered by:   Paulo Fruit  BS,CPht II,MPH   Authorized by:   Yisroel Ramming MD   Signed by:   Paulo Fruit  BS,CPht II,MPH on 07/05/2009   Method used:   Samples Given   RxID:   [id number] EPIVIR 300 MG TABS (LAMIVUDINE) Take 1 tablet by mouth once a day  #30 x 0   Entered by:   Paulo Fruit  BS,CPht II,MPH   Authorized by:   Yisroel Ramming MD   Signed by:   Paulo Fruit  BS,CPht II,MPH on 07/05/2009   Method used:   Samples Given   RxID:   [id number]   Patient Assist Medication Verification: Medication: Sustiva 600mg  Lot# [id number] Exp Date:Dec 2013 Tech approval:MLD                Patient Assist Medication Verification: Medication:Epivir 300mg  Lot# [id number] Exp Date:Sep 2013 Tech approval:MLD                Patient Assist Medication Verification: Medication:viread 300mg  Lot# [id number] Exp Date:07 2014 Tech approval:MLD Call placed to patient with message that assistance medications are ready for pick-up. Paulo Fruit  BS,CPht II,MPH  Jul 05, 2009 2:39 PM                  Appended Document: NCADAP/pt assist meds arrived for May Prescription/Samples picked up by: patient

## 2010-03-29 NOTE — Progress Notes (Signed)
Summary: NCADAP/pt assist meds arrived for Jun  Phone Note Refill Request      Prescriptions: VIREAD 300 MG TABS (TENOFOVIR DISOPROXIL FUMARATE) Take 1 tablet by mouth once a day  #30 x 0   Entered by:   Paulo Fruit  BS,CPht II,MPH   Authorized by:   Yisroel Ramming MD   Signed by:   Paulo Fruit  BS,CPht II,MPH on 08/14/2009   Method used:   Samples Given   RxID:   [id number] EPIVIR 300 MG TABS (LAMIVUDINE) Take 1 tablet by mouth once a day  #30 x 0   Entered by:   Paulo Fruit  BS,CPht II,MPH   Authorized by:   Yisroel Ramming MD   Signed by:   Paulo Fruit  BS,CPht II,MPH on 08/14/2009   Method used:   Samples Given   RxID:   [id number]  Patient Assist Medication Verification: Medication name: Viread 300mg  RX # [id number] Tech approval:MLD  Patient Assist Medication Verification: Medication name:Epivir 300mg  RX # [id number] Tech approval:MLD  [facility] sent patient the Sustiva to his home. [facility] is aware of the problem and is currently working on the issue too have all medications shipped at one time to one place. Call placed to patient with message that assistance medications are ready for pick-up. Paulo Fruit  BS,CPht II,MPH  August 14, 2009 9:47 AM

## 2010-03-29 NOTE — Assessment & Plan Note (Signed)
Summary: 2WK F/U/VS   CC:  follow-up visit, lab results, c/o anxiety and depression, stomach upset, and hemroids.  History of Present Illness: Pt c/o continued problems with indigestion and hemorrhoids. He has not been able to come up with the money he needs to he cna be seen by GI.  He c/o anxiety ad depression.  He was given some xanax for a counselor he was seeing which helped but he ran out.  I offered to refer him to our [facility] counselor but he was not interested.  He has been taking his HIV meds.  Depression History:      The patient is having a depressed mood most of the day and has a diminished interest in his usual daily activities.        The patient denies that he feels like life is not worth living, denies that he wishes that he were dead, and denies that he has thought about ending his life.        Preventive Screening-Counseling & Management  Alcohol-Tobacco     Alcohol drinks/day: 0     Smoking Status: quit  Caffeine-Diet-Exercise     Caffeine use/day: 0     Does Patient Exercise: yes     Type of exercise: yard work     Exercise (avg: min/session): 30-60     Times/week: 3  Safety-Violence-Falls     Seat Belt Use: yes      Sexual History:  currently monogamous.        Drug Use:  never.    Comments: pt. declined condoms   Updated Prior Medication List: EPIVIR 300 MG TABS (LAMIVUDINE) Take 1 tablet by mouth once a day VIREAD 300 MG TABS (TENOFOVIR DISOPROXIL FUMARATE) Take 1 tablet by mouth once a day SUSTIVA 600 MG TABS (EFAVIRENZ) Take 1 tablet by mouth at bedtime PROTONIX 40 MG TBEC (PANTOPRAZOLE SODIUM) Take 1 tablet by mouth once a day ANUSOL-HC 25 MG SUPP (HYDROCORTISONE ACETATE) one per rectum once daily ANDRODERM 5 MG/24HR PT24 (TESTOSTERONE) apply one patch a day  Current Allergies (reviewed today): ! SULFA ! * DAPSONE ! CODEINE ! CIPRO Past History:  Past Medical History: Last updated: 08/04/2006 Allergic rhinitis GERD HIV  disease Depression  Social History: Sexual History:  currently monogamous Drug Use:  never  Review of Systems  The patient denies anorexia, fever, and weight loss.    Vital Signs:  Patient profile:   55 year old male Height:      67 inches (170.18 cm) Weight:      177.0 pounds (80.45 kg) BMI:     27.82 Temp:     97.9 degrees F (36.61 degrees C) oral Pulse rate:   70 / minute BP sitting:   142 / 90  (right arm)  Vitals Entered By: Wendall Mola CMA Duncan Dull) (September 20, 2009 3:43 PM) CC: follow-up visit, lab results, c/o anxiety and depression, stomach upset, hemroids Is Patient Diabetic? No Pain Assessment Patient in pain? no      Nutritional Status BMI of 25 - 29 = overweight Nutritional Status Detail appetite "good"  Does patient need assistance? Functional Status Self care Ambulation Normal Comments no missed doses of meds per patient   Physical Exam  General:  alert, well-developed, well-nourished, and well-hydrated.   Head:  normocephalic and atraumatic.   Mouth:  pharynx pink and moist.   Lungs:  normal breath sounds.   Abdomen:  soft and non-tender.          Medication  Adherence: 09/20/2009   Adherence to medications reviewed with patient. Counseling to provide adequate adherence provided   Prevention For Positives: 09/20/2009   Safe sex practices discussed with patient. Condoms offered.                             Impression & Recommendations:  Problem # 1:  HIV DISEASE (ICD-042) Pt.s most recent CD4ct was 590 and VL <48 .  Pt instructed to continue the current antiretroviral regimen.  Pt encouraged to take medication regularly and not miss doses.  Pt will f/u in 3 months for repeat blood work and will see me 2 weeks later.  Diagnostics Reviewed:  CD4: 590 (09/07/2009)   WBC: 6.2 (09/06/2009)   Hgb: 14.8 (09/06/2009)   HCT: 45.3 (09/06/2009)   Platelets: 161 (09/06/2009) HIV-1 RNA: <48 copies/mL (09/06/2009)   HBSAg: NEG  (07/30/2007)  Future Orders: T-Testosterone; Total [phone]) ... 12/19/2009  Problem # 2:  EXTERNAL HEMORRHOIDS WITHOUT MENTION COMP (ICD-455.3) with occasional diarrhea pt to call when he has the co-pay for his GI referral  Problem # 3:  ANXIETY STATE, UNSPECIFIED (ICD-300.00) pt refused referal for counseling He will call with the dose of xanax that he was on so i can call it in  Problem # 4:  HYPOGONADISM (ICD-257.2) testosterone low on androgel will try swithc to andoderm patch  Medications Added to Medication List This Visit: 1)  Androgel 25 Mg/2.5gm Gel (Testosterone) .... As directed, not sure of dose 2)  Androderm 5 Mg/24hr Pt24 (Testosterone) .... Apply one patch a day  Other Orders: Est. Patient Level IV (70350) Future Orders: T-CD4SP ([facility]) (CD4SP) ... 12/19/2009 T-HIV Viral Load [id number] ... 12/19/2009 T-Comprehensive Metabolic Panel [phone]) ... 12/19/2009 T-CBC w/Diff ([id number]) ... 12/19/2009  Patient Instructions: 1)  Please schedule a follow-up appointment in 3 months, 2 weeks after labs.  Prescriptions: ANDRODERM 5 MG/24HR PT24 (TESTOSTERONE) apply one patch a day  #30 x 5   Entered and Authorized by:   Yisroel Ramming MD   Signed by:   Yisroel Ramming MD on 09/20/2009   Method used:   Print then Give to Patient   RxID:   [id number]

## 2010-03-29 NOTE — Progress Notes (Signed)
Summary: [facility]/pt assist meds arrived for Apr  Phone Note Refill Request      Prescriptions: SUSTIVA 600 MG TABS (EFAVIRENZ) Take 1 tablet by mouth at bedtime  #30 x 0   Entered by:   Paulo Fruit  BS,CPht II,MPH   Authorized by:   Yisroel Ramming MD   Signed by:   Paulo Fruit  BS,CPht II,MPH on 06/08/2009   Method used:   Samples Given   RxID:   [id number] VIREAD 300 MG TABS (TENOFOVIR DISOPROXIL FUMARATE) Take 1 tablet by mouth once a day  #30 x 0   Entered by:   Paulo Fruit  BS,CPht II,MPH   Authorized by:   Yisroel Ramming MD   Signed by:   Paulo Fruit  BS,CPht II,MPH on 06/08/2009   Method used:   Samples Given   RxID:   [id number] EPIVIR 300 MG TABS (LAMIVUDINE) Take 1 tablet by mouth once a day  #30 x 0   Entered by:   Paulo Fruit  BS,CPht II,MPH   Authorized by:   Yisroel Ramming MD   Signed by:   Paulo Fruit  BS,CPht II,MPH on 06/08/2009   Method used:   Samples Given   RxID:   [id number]   Patient Assist Medication Verification: Medication: Epivir 300mg  QQV#[id number] Exp Date:Sep 2013 Tech approval:MLD                Patient Assist Medication Verification: Medication:Viread 300mg  Lot# [id number] Exp Date:07 2014 Tech approval:MLD                Patient Assist Medication Verification: Medication:Sustiva 600mg  Lot# [id number] Exp Date:Dec 2013 Tech approval:MLD Call placed to patient with message that assistance medications are ready for pick-up. Left message on patient's voicemail to call [facility] @ [phone] Paulo Fruit  BS,CPht II,MPH  June 08, 2009 4:27 PM

## 2010-03-29 NOTE — Progress Notes (Signed)
Summary: pt. notified ADAP meds arrived for Oct.  Phone Note Outgoing Call   Call placed by: Annice Pih Summary of Call: Pt. notified ADAP meds arrive, Epivir, Viread, and Sustiva Initial call taken by: Wendall Mola CMA Duncan Dull),  December 26, 2009 12:41 PM

## 2010-03-29 NOTE — Progress Notes (Signed)
Summary: Patient Assistance - ADAP  Phone Note Outgoing Call   Summary of Call: Pt's medication arrived, Sustiva, Epivir, and Viread advised patient that medication can be picked up. Initial call taken by: Altamease Oiler,  November 29, 2009 5:32 PM

## 2010-04-04 ENCOUNTER — Other Ambulatory Visit: Payer: Self-pay

## 2010-04-04 ENCOUNTER — Ambulatory Visit: Payer: Self-pay | Admitting: Adult Health

## 2010-04-05 ENCOUNTER — Ambulatory Visit: Payer: Self-pay | Admitting: Adult Health

## 2010-04-05 ENCOUNTER — Encounter: Payer: Self-pay | Admitting: Adult Health

## 2010-04-06 ENCOUNTER — Encounter (INDEPENDENT_AMBULATORY_CARE_PROVIDER_SITE_OTHER): Payer: Self-pay | Admitting: *Deleted

## 2010-04-11 ENCOUNTER — Encounter (INDEPENDENT_AMBULATORY_CARE_PROVIDER_SITE_OTHER): Payer: Self-pay | Admitting: *Deleted

## 2010-04-12 NOTE — Miscellaneous (Signed)
Summary: ADAP UPDATE  Clinical Lists Changes  Observations: Added new observation of AIDSDAP: Pending-APPROVAL 2012 (04/06/2010 9:24)

## 2010-04-16 ENCOUNTER — Encounter (INDEPENDENT_AMBULATORY_CARE_PROVIDER_SITE_OTHER): Payer: Self-pay | Admitting: *Deleted

## 2010-04-18 NOTE — Miscellaneous (Signed)
  Clinical Lists Changes  Observations: Added new observation of AIDSDAP: Pending 2012 (04/11/2010 15:53) Added new observation of HOUSEINCOME: 04540  (04/11/2010 15:53)

## 2010-04-19 ENCOUNTER — Ambulatory Visit (INDEPENDENT_AMBULATORY_CARE_PROVIDER_SITE_OTHER): Payer: Self-pay | Admitting: Adult Health

## 2010-04-19 ENCOUNTER — Encounter: Payer: Self-pay | Admitting: Adult Health

## 2010-04-19 DIAGNOSIS — Z0000 Encounter for general adult medical examination without abnormal findings: Secondary | ICD-10-CM

## 2010-04-19 LAB — CONVERTED CEMR LAB

## 2010-04-20 ENCOUNTER — Telehealth: Payer: Self-pay | Admitting: Adult Health

## 2010-04-24 NOTE — Assessment & Plan Note (Signed)
Summary: personal issue?   Vital Signs:  Patient profile:   55 year old male Height:      67 inches Weight:      182.4 pounds BMI:     28.67 Temp:     98.3 degrees F oral Pulse rate:   82 / minute BP sitting:   124 / 84  (left arm)  Vitals Entered By: Alesia Morin CMA (April 19, 2010 3:46 PM) CC: Pt in today need to speak with the provider and was not comfortable speaking about it. Is Patient Diabetic? No Pain Assessment Patient in pain? no      Nutritional Status BMI of 25 - 29 = overweight Nutritional Status Detail appetite "good"  Have you ever been in a relationship where you felt threatened, hurt or afraid?No   Does patient need assistance? Functional Status Self care Ambulation Normal Comments no missed doese of meds   CC:  Pt in today need to speak with the provider and was not comfortable speaking about it.Marland Kitchen  History of Present Illness: Presents for evaluation of possible exposure to syphilis.  Partner recently diagnosed and treated for secondary syphilis, but just recently told him of this.  Endorses last sexually active with partner has been > 2 months.  Denies rash development, chancrelike skin lesions, penile lesions, fever, headache or neck stiffness.  Preventive Screening-Counseling & Management  Alcohol-Tobacco     Alcohol drinks/day: 0     Smoking Status: quit  Caffeine-Diet-Exercise     Caffeine use/day: 0     Does Patient Exercise: yes     Type of exercise: yard work     Exercise (avg: min/session): 30-60     Times/week: 3  Safety-Violence-Falls     Seat Belt Use: yes      Sexual History:  currently monogamous.        Drug Use:  never.        Blood Transfusions:  no.        Travel History:  no.    Comments: pt declined condoms  Allergies: 1)  ! Sulfa 2)  ! * Dapsone 3)  ! Codeine 4)  ! Cipro  Social History: Blood Transfusions:  no Travel History:  no  Review of Systems  The patient denies anorexia, fever, weight loss,  weight gain, vision loss, decreased hearing, hoarseness, chest pain, syncope, dyspnea on exertion, peripheral edema, prolonged cough, headaches, hemoptysis, abdominal pain, melena, hematochezia, severe indigestion/heartburn, hematuria, incontinence, genital sores, muscle weakness, suspicious skin lesions, transient blindness, difficulty walking, depression, unusual weight change, abnormal bleeding, enlarged lymph nodes, angioedema, and testicular masses.    Physical Exam  General:  alert, well-developed, well-nourished, and well-hydrated.   Head:  normocephalic, atraumatic, no abnormalities observed, and no abnormalities palpated.   Eyes:  vision grossly intact, pupils equal, pupils round, and pupils reactive to light.   Ears:  R ear normal and L ear normal.   Nose:  no external deformity, no external erythema, and no nasal discharge.   Mouth:  good dentition, no gingival abnormalities, no dental plaque, and pharynx pink and moist.   Neck:  supple, full ROM, and no masses.   Chest Wall:  no deformities, no tenderness, and no masses.   Lungs:  normal respiratory effort and normal breath sounds.   Heart:  normal rate and regular rhythm.   Abdomen:  soft, non-tender, normal bowel sounds, no hepatomegaly, and no splenomegaly.   Rectal:  no external abnormalities, no masses, and no perianal rash.  Genitalia:  Testes bilaterally descended without nodularity, tenderness or masses. No scrotal masses or lesions. No penis lesions or urethral discharge. Msk:  normal ROM, no joint tenderness, no joint swelling, and no joint warmth.   Neurologic:  alert & oriented X3, cranial nerves II-XII intact, strength normal in all extremities, and gait normal.   Skin:  no rashes and no suspicious lesions.   Inguinal Nodes:  Small shoody LN's Psych:  Oriented X3, memory intact for recent and remote, normally interactive, good eye contact, and slightly anxious.     Impression & Recommendations:  Problem # 1:   SEXUALLY TRANSMITTED DISEASE, EXPOSURE TO (ICD-V01.6) Will check RPR with reflex to TP Ab titer.  Inform him we will call him once we get lab results.   Discussed transmission, incubation, and treatment strategies.  Also discussed measures of barrier protection and use of condoms.  Verbally acknowledged information, but endorsed he has no desire to be sexually active at present.  We will follow-up with him regarding this matter on f/u.  Other Orders: T-RPR (Syphilis) [phone]) Est. Patient Level III (95621)   Orders Added: 1)  T-RPR (Syphilis) [[id number]] 2)  Est. Patient Level III [62952]

## 2010-04-24 NOTE — Assessment & Plan Note (Signed)
Summary: new to md f/u for lab   Vital Signs:  Patient profile:   55 year old male Height:      67 inches Weight:      182 pounds BMI:     28.61 Temp:     98.3 degrees F oral Pulse rate:   83 / minute BP sitting:   140 / 92  (right arm)  Vitals Entered By: Alesia Morin CMA (April 05, 2010 3:45 PM) CC: follow-up visit for labs Is Patient Diabetic? No Pain Assessment Patient in pain? no      Nutritional Status BMI of > 30 = obese Nutritional Status Detail appetite "good"  Have you ever been in a relationship where you felt threatened, hurt or afraid?No   Does patient need assistance? Functional Status Self care Ambulation Normal Comments no missed doses   CC:  follow-up visit for labs.  History of Present Illness: In for f/u.  Adherent to ARV regimen with good tolerance.  c/o painful defecation secondary to internal hemorrhoids.   Having some blood on tissue when wiping.Also having continued anxiety issues, requesting refill alprazolam.  Still have libido issues, thinks he is not responding to testosterone therapy.  Preventive Screening-Counseling & Management  Alcohol-Tobacco     Alcohol drinks/day: 0     Smoking Status: quit  Caffeine-Diet-Exercise     Caffeine use/day: 0     Does Patient Exercise: yes     Type of exercise: yard work     Exercise (avg: min/session): 30-60     Times/week: 3  Safety-Violence-Falls     Seat Belt Use: yes      Sexual History:  currently monogamous.        Drug Use:  never.    Comments: pt declined condoms  Allergies: 1)  ! Sulfa 2)  ! * Dapsone 3)  ! Codeine 4)  ! Cipro 5)  ! Bactrim  Past History:  Past medical, surgical, family and social histories (including risk factors) reviewed for relevance to current acute and chronic problems.  Past Medical History: Reviewed history from 08/04/2006 and no changes required. Allergic rhinitis GERD HIV disease Depression  Family History: Reviewed history and no  changes required.  Social History: Reviewed history and no changes required.  Review of Systems  The patient denies anorexia, fever, weight loss, weight gain, vision loss, decreased hearing, hoarseness, chest pain, syncope, dyspnea on exertion, peripheral edema, prolonged cough, headaches, hemoptysis, abdominal pain, melena, hematochezia, severe indigestion/heartburn, hematuria, incontinence, genital sores, muscle weakness, suspicious skin lesions, transient blindness, difficulty walking, depression, unusual weight change, abnormal bleeding, enlarged lymph nodes, angioedema, breast masses, and testicular masses.         See HPI  Physical Exam  General:  alert, well-developed, well-nourished, and well-hydrated.   Head:  normocephalic, atraumatic, no abnormalities observed, and no abnormalities palpated.   Eyes:  vision grossly intact, pupils equal, pupils round, and pupils reactive to light.   Ears:  R ear normal and L ear normal.   Nose:  no external deformity, no external erythema, and no nasal discharge.   Mouth:  good dentition, no gingival abnormalities, no dental plaque, and pharynx pink and moist.   Neck:  supple, full ROM, and no masses.   Chest Wall:  no deformities, no tenderness, and no masses.   Lungs:  normal respiratory effort and normal breath sounds.   Heart:  normal rate, regular rhythm, no murmur, no gallop, and no rub.   Abdomen:  soft, non-tender, normal  bowel sounds, no hepatomegaly, and no splenomegaly.   Rectal:  Unable to assess internally due to tenderness at anal sphincter.no external abnormalities, no hemorrhoids, normal sphincter tone, no masses, and perirectal tenderness.   Genitalia:  Testes bilaterally descended without nodularity, tenderness or masses. No scrotal masses or lesions. No penis lesions or urethral discharge. Msk:  normal ROM, no joint tenderness, no joint swelling, and no joint warmth.   Extremities:  No clubbing, cyanosis, edema, or deformity  noted with normal full range of motion of all joints.   Neurologic:  alert & oriented X3, cranial nerves II-XII intact, strength normal in all extremities, and gait normal.   Skin:  turgor normal, color normal, no rashes, and no suspicious lesions.   Cervical Nodes:  small shoddy A&P LN's Axillary Nodes:  Small shoody LN's Inguinal Nodes:  Small shoody LN's Psych:  memory intact for recent and remote, normally interactive, good eye contact, not depressed appearing, and slightly anxious.     Impression & Recommendations:  Problem # 1:  HIV DISEASE (ICD-042) CD4 760 @ 38% with VL at <20 copies/ml.  Stable on regimen.  Discussed treatment simplification, and expressed interest.  Will discontinue Viread, Epivir, Sustiva, and begin Atripla 1 tab by mouth at bedtime.  He is to return for re-staging labs in 4 weeks with a follow-up in 6 weeks.  Verbally acknowledged and agreed with plan.  Problem # 2:  EXTERNAL HEMORRHOIDS WITHOUT MENTION COMP (ICD-455.3) Hemorrhoids actually appear internal as no outer hemorrhoids noted.   Will try proctofoam HC 2-3 x/day as directed and check response on follow-up.  Fiber supplementation also discussed as stool softening measure.  Verbally acknowledged and agreed with plan.  Problem # 3:  HYPOGONADISM (ICD-257.2) Random serum testosterone level is 248.71 (Tanner Stage III hypogonadism).  Will increase Androgel to 2 5gm packets/day and re-check values in 3 months.  Problem # 4:  ANXIETY STATE, UNSPECIFIED (ICD-300.00) Renew alprazolam. His updated medication list for this problem includes:    Alprazolam 0.5 Mg Tabs (Alprazolam) .Marland Kitchen... 1 - 2 tabs by mouth at bedtime as needed sleep.  Medications Added to Medication List This Visit: 1)  Epivir 300 Mg Tabs (Lamivudine) .... **discontinue epivir** 2)  Viread 300 Mg Tabs (Tenofovir disoproxil fumarate) .... **disconitinue viread** 3)  Sustiva 600 Mg Tabs (Efavirenz) .... **discontinue efavirenz** 4)  Proctofoam Hc  1-1 % Foam (Hydrocortisone ace-pramoxine) .... Use as directed 3-4 times once daily. 5)  Alprazolam 0.5 Mg Tabs (Alprazolam) .Marland Kitchen.. 1 - 2 tabs by mouth at bedtime as needed sleep. 6)  Atripla 600-200-300 Mg Tabs (Efavirenz-emtricitab-tenofovir) .... Take one (1) tablet once a day at bedtime on empty stomach 7)  Androgel 50 Mg/5gm Gel (Testosterone) .... Apply 2 packets to skin as directed once daily  Other Orders: Influenza Vaccine NON MCR (69629) Prescriptions: ANDROGEL 50 MG/5GM GEL (TESTOSTERONE) Apply 2 packets to skin as directed once daily  #60 packets x 5   Entered and Authorized by:   Talmadge Chad NP   Signed by:   Talmadge Chad NP on 04/19/2010   Method used:   Print then Give to Patient   RxID:   [id number] ATRIPLA 600-200-300 MG TABS (EFAVIRENZ-EMTRICITAB-TENOFOVIR) Take one (1) tablet once a day at bedtime on empty stomach  #30 x 5   Entered and Authorized by:   Talmadge Chad NP   Signed by:   Talmadge Chad NP on 04/05/2010   Method used:   Print then Give to Patient   RxID:  [id number] EPIVIR 300 MG TABS (LAMIVUDINE) **DISCONTINUE EPIVIR**  #1 x 0   Entered and Authorized by:   Talmadge Chad NP   Signed by:   Talmadge Chad NP on 04/05/2010   Method used:   Print then Give to Patient   RxID:   [id number] VIREAD 300 MG TABS (TENOFOVIR DISOPROXIL FUMARATE) **DISCONITINUE Stevie Kern**  #1 x 0   Entered and Authorized by:   Talmadge Chad NP   Signed by:   Talmadge Chad NP on 04/05/2010   Method used:   Print then Give to Patient   RxID:   [id number] SUSTIVA 600 MG TABS (EFAVIRENZ) **DISCONTINUE EFAVIRENZ**  #1 x 0   Entered and Authorized by:   Talmadge Chad NP   Signed by:   Talmadge Chad NP on 04/05/2010   Method used:   Print then Give to Patient   RxID:   [id number] ALPRAZOLAM 0.5 MG TABS (ALPRAZOLAM) 1 - 2 tabs by mouth at bedtime as needed sleep.  #30 x 2   Entered and Authorized by:    Talmadge Chad NP   Signed by:   Talmadge Chad NP on 04/05/2010   Method used:   Print then Give to Patient   RxID:   [id number] PROCTOFOAM HC 1-1 % FOAM (HYDROCORTISONE ACE-PRAMOXINE) Use as directed 3-4 times once daily.  #10g x 2   Entered and Authorized by:   Talmadge Chad NP   Signed by:   Talmadge Chad NP on 04/05/2010   Method used:   Print then Give to Patient   RxID:   [id number] ALPRAZOLAM 0.5 MG TABS (ALPRAZOLAM) 1 - 2 tabs by mouth at bedtime as needed sleep.  #30 x 2   Entered and Authorized by:   Talmadge Chad NP   Signed by:   Talmadge Chad NP on 04/05/2010   Method used:   Print then Give to Patient   RxID:   [id number] PROCTOFOAM HC 1-1 % FOAM (HYDROCORTISONE ACE-PRAMOXINE) Use as directed 3-4 times once daily.  #10g x 2   Entered and Authorized by:   Talmadge Chad NP   Signed by:   Talmadge Chad NP on 04/05/2010   Method used:   Print then Give to Patient   RxID:   [id number]  Error in writing Rx's, only 1 alprazolam and proctofoam HC precription given to patient.  Orders Added: 1)  Influenza Vaccine NON MCR [00028]   Immunizations Administered:  Influenza Vaccine # 1:    Vaccine Type: Fluvax Non-MCR    Site: left deltoid    Mfr: GlaxoSmithKline    Dose: 0.5 ml    Route: IM    Given by: Alesia Morin CMA    Exp. Date: 08/25/2010    Lot #: [id number]    VIS given: 09/19/09 version given April 05, 2010.  Flu Vaccine Consent Questions:    Do you have a history of severe allergic reactions to this vaccine? no    Any prior history of allergic reactions to egg and/or gelatin? no    Do you have a sensitivity to the preservative Thimersol? no    Do you have a past history of Guillan-Barre Syndrome? no    Do you currently have an acute febrile illness? no    Have you ever had a severe reaction to latex? no    Vaccine information given and explained to patient? yes   Immunizations  Administered:  Influenza Vaccine # 1:    Vaccine Type: Fluvax Non-MCR    Site: left  deltoid    Mfr: GlaxoSmithKline    Dose: 0.5 ml    Route: IM    Given by: Alesia Morin CMA    Exp. Date: 08/25/2010    Lot #: [id number]    VIS given: 09/19/09 version given April 05, 2010.

## 2010-04-24 NOTE — Miscellaneous (Signed)
  Clinical Lists Changes  Observations: Added new observation of HIV RISK BEH: MSM (04/16/2010 11:09)

## 2010-04-24 NOTE — Miscellaneous (Signed)
  Clinical Lists Changes 

## 2010-05-01 ENCOUNTER — Encounter (INDEPENDENT_AMBULATORY_CARE_PROVIDER_SITE_OTHER): Payer: Self-pay | Admitting: *Deleted

## 2010-05-03 NOTE — Progress Notes (Signed)
   pharmacist from [facility] called to verify that he is on ATRIPLA, not the other 3 he had been on. told him this is correct per chart. I called pt to make sure he knew he would be getting Atripla & not the other 3 meds sent to him. he understood.Golden Circle RN  April 20, 2010 12:10 PM

## 2010-05-08 NOTE — Miscellaneous (Signed)
Summary: adap approved til 11/25/10  Clinical Lists Changes  Observations: Added new observation of FINASSESSDT: 04/05/2010 (05/01/2010 16:42) Added new observation of YEARLYEXPEN: 0  (05/01/2010 16:42) Added new observation of PCTFPL: 141.04  (05/01/2010 16:42) Added new observation of HOUSEINCOME: 84132  (05/01/2010 16:42) Added new observation of AIDSDAP: Yes 2012  (05/01/2010 16:42)

## 2010-05-13 LAB — T-HELPER CELL (CD4) - (RCID CLINIC ONLY)
CD4 % Helper T Cell: 35 % (ref 33–55)
CD4 T Cell Abs: 590 uL (ref 400–2700)

## 2010-08-06 ENCOUNTER — Ambulatory Visit: Payer: Self-pay

## 2010-08-07 ENCOUNTER — Other Ambulatory Visit: Payer: Self-pay | Admitting: Licensed Clinical Social Worker

## 2010-08-07 ENCOUNTER — Other Ambulatory Visit (INDEPENDENT_AMBULATORY_CARE_PROVIDER_SITE_OTHER): Payer: Self-pay

## 2010-08-07 DIAGNOSIS — R7989 Other specified abnormal findings of blood chemistry: Secondary | ICD-10-CM

## 2010-08-07 DIAGNOSIS — E291 Testicular hypofunction: Secondary | ICD-10-CM

## 2010-08-07 DIAGNOSIS — B20 Human immunodeficiency virus [HIV] disease: Secondary | ICD-10-CM

## 2010-08-08 LAB — COMPLETE METABOLIC PANEL WITH GFR
ALT: 22 U/L (ref 0–53)
AST: 19 U/L (ref 0–37)
Alkaline Phosphatase: 81 U/L (ref 39–117)
BUN: 18 mg/dL (ref 6–23)
Creat: 1.03 mg/dL (ref 0.50–1.35)

## 2010-08-08 LAB — CBC WITH DIFFERENTIAL/PLATELET
Eosinophils Relative: 3 % (ref 0–5)
HCT: 45.1 % (ref 39.0–52.0)
Hemoglobin: 15 g/dL (ref 13.0–17.0)
Lymphocytes Relative: 27 % (ref 12–46)
Lymphs Abs: 1.5 10*3/uL (ref 0.7–4.0)
MCV: 95.8 fL (ref 78.0–100.0)
Monocytes Absolute: 0.5 10*3/uL (ref 0.1–1.0)
Monocytes Relative: 9 % (ref 3–12)
RBC: 4.71 MIL/uL (ref 4.22–5.81)
WBC: 5.7 10*3/uL (ref 4.0–10.5)

## 2010-08-08 LAB — HIV-1 RNA QUANT-NO REFLEX-BLD
HIV 1 RNA Quant: <20 copies/mL (ref <20)
HIV-1 RNA Quant, Log: <1.3 {Log} (ref <1.30)

## 2010-08-08 LAB — TESTOSTERONE: Testosterone: 378.66 ng/dL (ref 250–890)

## 2010-08-08 LAB — T-HELPER CELL (CD4) - (RCID CLINIC ONLY): CD4 % Helper T Cell: 38 % (ref 33–55)

## 2010-08-21 ENCOUNTER — Ambulatory Visit (INDEPENDENT_AMBULATORY_CARE_PROVIDER_SITE_OTHER): Payer: Self-pay | Admitting: Adult Health

## 2010-08-21 ENCOUNTER — Encounter: Payer: Self-pay | Admitting: Adult Health

## 2010-08-21 ENCOUNTER — Ambulatory Visit: Payer: Self-pay

## 2010-08-21 DIAGNOSIS — J309 Allergic rhinitis, unspecified: Secondary | ICD-10-CM | POA: Insufficient documentation

## 2010-08-21 DIAGNOSIS — K056 Periodontal disease, unspecified: Secondary | ICD-10-CM

## 2010-08-21 DIAGNOSIS — B20 Human immunodeficiency virus [HIV] disease: Secondary | ICD-10-CM

## 2010-08-21 DIAGNOSIS — K219 Gastro-esophageal reflux disease without esophagitis: Secondary | ICD-10-CM

## 2010-08-21 DIAGNOSIS — H6120 Impacted cerumen, unspecified ear: Secondary | ICD-10-CM

## 2010-08-21 MED ORDER — CHLORHEXIDINE GLUCONATE 0.12 % MT SOLN: 15.0000 mL | Freq: Two times a day (BID) | OROMUCOSAL | Status: AC

## 2010-08-21 NOTE — Patient Instructions (Signed)
1. Frequent use of saline nasal mist throughout the day. 2. Humidification in the home, especially at nighttime. 3. Zyrtec (cetirizine), 10 mg by mouth daily. 4. Benadryl (diphenhydramine), 25-50 mg by mouth each bedtime. 5. Robitussin (guaifenesin), 3 teaspoons, by mouth every 4 hours when necessary.

## 2010-08-21 NOTE — Progress Notes (Signed)
Subjective:    Patient ID: Albert Brown, male    DOB: May 29, 1955, 55 y.o.   MRN: [id number]  HPI Presents to clinic today for followup. Endorses adherence to antiretrovirals with good tolerance and no complications. Recently recovering from viral syndrome. That caused, chills, sweats, and exertional fatigue, malaise, and now, sinus congestion, and mild cough. Also, complaints of multiple, dental problems, with multiple cracked teeth, and some caries. Also, complains of GERD symptoms, but has not had any, Protonix, as previously prescribed. Also, complaints of "clogged" right ear for which she has not been able to remove the cerumen. Also complains of depression or difficulty sleeping at night, but does not want any medications for it at present.   Review of Systems  Constitutional: Positive for chills, activity change and fatigue.  HENT: Positive for hearing loss, congestion, rhinorrhea and postnasal drip. Negative for sore throat and trouble swallowing.   Eyes: Negative.   Respiratory: Positive for cough. Negative for shortness of breath and wheezing.   Cardiovascular: Negative.   Gastrointestinal: Positive for nausea. Negative for vomiting and diarrhea.  Genitourinary: Negative.   Musculoskeletal: Negative.   Neurological: Negative.   Psychiatric/Behavioral: Positive for sleep disturbance and agitation. Negative for suicidal ideas and self-injury. The patient is nervous/anxious.        Objective:   Physical Exam  Constitutional: He is oriented to person, place, and time. He appears well-developed and well-nourished. No distress.  HENT:  Head: Normocephalic and atraumatic.  Left Ear: External ear normal.  Mouth/Throat: No oropharyngeal exudate.       Right ear canal impacted with cerumen. Postnasal drainage noted. Posterior pharynx Poor dentition  Eyes: Conjunctivae and EOM are normal. Pupils are equal, round, and reactive to light.  Neck: Normal range of motion. Neck supple.    Cardiovascular: Normal rate and regular rhythm.   Pulmonary/Chest: Effort normal and breath sounds normal.  Abdominal: Soft. Bowel sounds are normal.  Musculoskeletal: Normal range of motion.  Neurological: He is alert and oriented to person, place, and time. No cranial nerve deficit. Coordination normal.  Skin: Skin is warm and dry.  Psychiatric: He has a normal mood and affect. His behavior is normal. Judgment and thought content normal.          Assessment & Plan:  1. HIV. Labs obtained 08/07/2010 show a CD4 count of 590 at 38% with a viral load of less than 20 copies/mL. Clinically stable on current regimen. Recommend continuing present management, following up in 4 months with repeat labs 2 weeks before next visit.  2. Resolving viral syndrome with sinusitis. Recommend saline nasal spray frequently, humidification at home, cetirizine 10 mg by mouth daily, diphenhydramine 25-50 mg by mouth each bedtime, guaifenesin, 3 teaspoons, by mouth every 4 hours when necessary.  3. Periodontal disease. Recommend a referral to dental s services. We'll begin chlorhexidine gluconate 0.12% mouthwash rinse and spit 15 mL's twice a day.  4. Symptoms of depression. He does not express suicidal thoughts or tendencies. He states he would like to see how. He feels after he physically improves from his current illness before considering any medication therapy. We would recommend at this time. Should he have worsening depression symptoms to contact the clinic for reevaluation and consideration for therapy.  5. Impacted cerumen right ear. As he has tried various measures to remove the wax has been unsuccessful, we recommend a referral to an ENT specialist. However, he lacks entitlements, right now, so, we must refer him first to obtain a [place] healthcare benefit card.  He verbally acknowledged all this information and agreed with plan of care.

## 2010-08-22 ENCOUNTER — Telehealth: Payer: Self-pay | Admitting: *Deleted

## 2010-08-22 NOTE — Telephone Encounter (Signed)
Trying to do referral for ears. No evidence of insurance. Lm asking him to call if none, will do project access

## 2010-08-27 NOTE — Telephone Encounter (Signed)
No insurance. Sent in app to project access

## 2010-09-05 ENCOUNTER — Telehealth: Payer: Self-pay | Admitting: *Deleted

## 2010-09-05 NOTE — Telephone Encounter (Signed)
He is to come in this week to see if he qualfies for orange card. If yes, fax to project access

## 2010-09-07 ENCOUNTER — Ambulatory Visit: Payer: Self-pay

## 2010-10-18 ENCOUNTER — Other Ambulatory Visit: Payer: Self-pay | Admitting: *Deleted

## 2010-10-18 DIAGNOSIS — B20 Human immunodeficiency virus [HIV] disease: Secondary | ICD-10-CM

## 2010-10-18 MED ORDER — EFAVIRENZ-EMTRICITAB-TENOFOVIR 600-200-300 MG PO TABS: 1.0000 | ORAL_TABLET | Freq: Every day | ORAL | Status: DC

## 2010-10-25 ENCOUNTER — Other Ambulatory Visit: Payer: Self-pay | Admitting: *Deleted

## 2010-10-25 DIAGNOSIS — E291 Testicular hypofunction: Secondary | ICD-10-CM

## 2010-10-25 NOTE — Telephone Encounter (Signed)
I called to re-order his androgel. They told me it expired in May 2012. They will fax me another enrollment form. I will speak with him tomorrow and get the form done.

## 2010-11-07 NOTE — Telephone Encounter (Signed)
He called about the med. Told him again that it had expired & to come in & sign. The form is in his red chart. States he will come in todAY

## 2010-11-07 NOTE — Telephone Encounter (Signed)
Faxed rx back to [employer] for his [place]

## 2010-11-14 ENCOUNTER — Other Ambulatory Visit: Payer: Self-pay | Admitting: *Deleted

## 2010-11-14 DIAGNOSIS — E291 Testicular hypofunction: Secondary | ICD-10-CM

## 2010-11-14 MED ORDER — TESTOSTERONE 50 MG/5GM (1%) TD GEL: 5.0000 g | Freq: Every day | TRANSDERMAL | Status: DC

## 2010-11-15 ENCOUNTER — Telehealth: Payer: Self-pay | Admitting: *Deleted

## 2010-11-15 NOTE — Telephone Encounter (Signed)
Faxed & mailed the app after he stopped by to sign. Copy of all in red file

## 2010-11-15 NOTE — Telephone Encounter (Signed)
Forms & rx ready to mail for his androgel. Pt is aware & is coming in to sign

## 2010-11-21 NOTE — Telephone Encounter (Signed)
Called to check the status of the application.  It has not been received.  Please check the status again in a week.

## 2010-11-28 NOTE — Telephone Encounter (Signed)
I called [employer] to see if the med has been approved. I spoke with Diane who stated the questions about rx coverage were not answered. I answered no to all questions & faxed it back to diane at her number

## 2010-12-04 ENCOUNTER — Telehealth: Payer: Self-pay | Admitting: *Deleted

## 2010-12-04 NOTE — Telephone Encounter (Signed)
Spoke with [employer] rep. She states they mailed it back to pt regarding ins questions

## 2010-12-05 NOTE — Telephone Encounter (Signed)
Mr. Haithcock brought in more information that was requested from the [employer] Patient Assistance Program.  It was faxed today.  Please check status in one week.

## 2010-12-06 ENCOUNTER — Telehealth: Payer: Self-pay | Admitting: *Deleted

## 2010-12-06 NOTE — Telephone Encounter (Signed)
Cara from the PAP for androgel called to tell us it has been approved & shipped. Pt notified

## 2010-12-20 ENCOUNTER — Other Ambulatory Visit: Payer: Self-pay | Admitting: Adult Health

## 2010-12-21 ENCOUNTER — Other Ambulatory Visit: Payer: Self-pay | Admitting: *Deleted

## 2010-12-21 DIAGNOSIS — F419 Anxiety disorder, unspecified: Secondary | ICD-10-CM

## 2010-12-21 MED ORDER — ALPRAZOLAM 0.5 MG PO TABS: 0.5000 mg | ORAL_TABLET | Freq: Every evening | ORAL | Status: DC | PRN

## 2011-01-01 ENCOUNTER — Telehealth: Payer: Self-pay | Admitting: Licensed Clinical Social Worker

## 2011-01-01 NOTE — Telephone Encounter (Signed)
Patient is requesting Alprazolam 20 days early, can we refill or does he need an appointment to discuss why he is using it so often?

## 2011-01-01 NOTE — Telephone Encounter (Signed)
We need an explanation for this early request

## 2011-01-08 NOTE — Telephone Encounter (Signed)
He did not answer the phone.

## 2011-03-17 ENCOUNTER — Other Ambulatory Visit: Payer: Self-pay | Admitting: Infectious Disease

## 2011-03-17 DIAGNOSIS — F419 Anxiety disorder, unspecified: Secondary | ICD-10-CM

## 2011-03-18 ENCOUNTER — Other Ambulatory Visit: Payer: Self-pay | Admitting: Infectious Disease

## 2011-03-18 NOTE — Telephone Encounter (Signed)
His last fill of alprazolam was 12/21/10. Called refill of #30 to his pharmacy

## 2011-04-19 ENCOUNTER — Ambulatory Visit: Payer: Self-pay

## 2011-07-09 ENCOUNTER — Other Ambulatory Visit: Payer: Self-pay | Admitting: Internal Medicine

## 2011-07-09 DIAGNOSIS — B20 Human immunodeficiency virus [HIV] disease: Secondary | ICD-10-CM

## 2011-07-09 DIAGNOSIS — Z79899 Other long term (current) drug therapy: Secondary | ICD-10-CM

## 2011-07-09 DIAGNOSIS — Z113 Encounter for screening for infections with a predominantly sexual mode of transmission: Secondary | ICD-10-CM

## 2011-07-11 ENCOUNTER — Other Ambulatory Visit (INDEPENDENT_AMBULATORY_CARE_PROVIDER_SITE_OTHER): Payer: Self-pay

## 2011-07-11 DIAGNOSIS — B20 Human immunodeficiency virus [HIV] disease: Secondary | ICD-10-CM

## 2011-07-11 DIAGNOSIS — Z79899 Other long term (current) drug therapy: Secondary | ICD-10-CM

## 2011-07-11 DIAGNOSIS — Z113 Encounter for screening for infections with a predominantly sexual mode of transmission: Secondary | ICD-10-CM

## 2011-07-11 LAB — COMPREHENSIVE METABOLIC PANEL
AST: 22 U/L (ref 0–37)
Albumin: 4.5 g/dL (ref 3.5–5.2)
BUN: 22 mg/dL (ref 6–23)
CO2: 25 mEq/L (ref 19–32)
Calcium: 9.3 mg/dL (ref 8.4–10.5)
Chloride: 107 mEq/L (ref 96–112)
Creat: 1.03 mg/dL (ref 0.50–1.35)
Glucose, Bld: 78 mg/dL (ref 70–99)
Potassium: 4.6 mEq/L (ref 3.5–5.3)

## 2011-07-11 LAB — LIPID PANEL
HDL: 44 mg/dL (ref >39)
LDL Cholesterol: 96 mg/dL (ref 0–99)
Total CHOL/HDL Ratio: 3.6 Ratio

## 2011-07-12 LAB — CBC WITH DIFFERENTIAL/PLATELET
Eosinophils Relative: 3 % (ref 0–5)
HCT: 44.2 % (ref 39.0–52.0)
Hemoglobin: 14.8 g/dL (ref 13.0–17.0)
Lymphocytes Relative: 31 % (ref 12–46)
Lymphs Abs: 1.7 10*3/uL (ref 0.7–4.0)
MCV: 92.9 fL (ref 78.0–100.0)
Monocytes Absolute: 0.5 10*3/uL (ref 0.1–1.0)
Monocytes Relative: 9 % (ref 3–12)
Neutro Abs: 3.1 10*3/uL (ref 1.7–7.7)
RBC: 4.76 MIL/uL (ref 4.22–5.81)
RDW: 13.4 % (ref 11.5–15.5)
WBC: 5.4 10*3/uL (ref 4.0–10.5)

## 2011-07-12 LAB — T-HELPER CELL (CD4) - (RCID CLINIC ONLY): CD4 T Cell Abs: 650 uL (ref 400–2700)

## 2011-07-16 LAB — HIV-1 RNA QUANT-NO REFLEX-BLD
HIV 1 RNA Quant: <20 copies/mL (ref <20)
HIV-1 RNA Quant, Log: <1.3 {Log} (ref <1.30)

## 2011-07-25 ENCOUNTER — Ambulatory Visit (INDEPENDENT_AMBULATORY_CARE_PROVIDER_SITE_OTHER): Payer: Self-pay | Admitting: Internal Medicine

## 2011-07-25 ENCOUNTER — Encounter: Payer: Self-pay | Admitting: Internal Medicine

## 2011-07-25 VITALS — BP 134/83 | HR 67 | Temp 98.1°F | Ht 68.0 in | Wt 179.0 lb

## 2011-07-25 DIAGNOSIS — H6120 Impacted cerumen, unspecified ear: Secondary | ICD-10-CM

## 2011-07-25 DIAGNOSIS — R197 Diarrhea, unspecified: Secondary | ICD-10-CM

## 2011-07-25 DIAGNOSIS — K644 Residual hemorrhoidal skin tags: Secondary | ICD-10-CM

## 2011-07-25 DIAGNOSIS — H6121 Impacted cerumen, right ear: Secondary | ICD-10-CM

## 2011-07-25 DIAGNOSIS — F3289 Other specified depressive episodes: Secondary | ICD-10-CM

## 2011-07-25 DIAGNOSIS — B20 Human immunodeficiency virus [HIV] disease: Secondary | ICD-10-CM

## 2011-07-25 DIAGNOSIS — Z23 Encounter for immunization: Secondary | ICD-10-CM

## 2011-07-25 DIAGNOSIS — E291 Testicular hypofunction: Secondary | ICD-10-CM

## 2011-07-25 DIAGNOSIS — F329 Major depressive disorder, single episode, unspecified: Secondary | ICD-10-CM

## 2011-07-25 MED ORDER — PAROXETINE HCL 10 MG PO TABS: 10.0000 mg | ORAL_TABLET | ORAL | Status: DC

## 2011-07-25 NOTE — Progress Notes (Signed)
  Subjective:    Patient ID: Albert Brown, male    DOB: 10-04-1955, 56 y.o.   MRN: [id number]  HPI Comes in for followup of his HIV. Has not been seen in about one year though has been doing well and continues to take his medication. He reports 100% compliance and good tolerance of the medications. In fact he has been undetectable now for several years. He does have multiple other complaints though. He has had a poor mood and energy for some time now and looking back in the notes it does show that he was depressed at his last visit. At that time he had refused any mental health intervention or medication. He states that he just does not have a lot of energy, does not describe any suicidal ideation. He also complains of his teeth and he has not heard back from the dentist for a follow up appointment. He also expressed his disappointment in not having a consistent provider. He continues to take testosterone replacement. He also complains of hemorrhoids. In addition he does have some anxiety.   Review of Systems  Constitutional: Negative.   HENT: Negative for sore throat and trouble swallowing.   Gastrointestinal: Negative for nausea, vomiting, abdominal pain, diarrhea and abdominal distention.       He does get discomfort with his hemorrhoids and has tried suppositories as well as topical therapy without relief. He does get occasional bloating and abdominal discomfort. He also describes loose stools after eating that are associated with the bloating.  Musculoskeletal: Negative.   Skin: Negative.   Hematological: Negative.   Psychiatric/Behavioral: Positive for dysphoric mood. Negative for suicidal ideas, hallucinations, sleep disturbance and self-injury. The patient is nervous/anxious.        Objective:   Physical Exam  Constitutional: He appears well-developed and well-nourished. No distress.  HENT:  Mouth/Throat: Oropharynx is clear and moist. No oropharyngeal exudate.  Cardiovascular:  Normal rate, regular rhythm and normal heart sounds.  Exam reveals no gallop and no friction rub.   No murmur heard. Pulmonary/Chest: Effort normal and breath sounds normal. No respiratory distress. He has no wheezes. He has no rales.  Abdominal: Soft. Bowel sounds are normal. He exhibits no distension. There is no tenderness. There is no rebound.  Lymphadenopathy:    He has no cervical adenopathy.  Skin: Skin is warm and dry. No rash noted.          Assessment & Plan:

## 2011-07-25 NOTE — Assessment & Plan Note (Signed)
He will continue with his testosterone replacement.

## 2011-07-25 NOTE — Assessment & Plan Note (Signed)
He is doing well and he will continue his current regimen. I did remind him to use condoms with all sexual activity. I will have him return in 4 months for routine followup.

## 2011-07-25 NOTE — Assessment & Plan Note (Signed)
I discussed with him at length that if he has continued problems with depression that he certainly may benefit from counseling. He is resistant though I did tell him that if he had persistent depression that this may help. I also offered pharmacologic therapy which she is going to think about. I did prescribe this as Paxil and did explained to him the side effects and the need to take it for at least 2 weeks to see if there is any benefit. Numbers for mental health provider were given to him.

## 2011-07-25 NOTE — Assessment & Plan Note (Signed)
He will return for a nurse visit for ear wax cleaning.

## 2011-07-25 NOTE — Assessment & Plan Note (Signed)
I think his diarrhea and bloating is most likely related to irritable bowel syndrome. I encouraged him to try the therapy for depression and see if there is any help with that.

## 2011-07-25 NOTE — Assessment & Plan Note (Signed)
Continues to have problems with hemorrhoids. I have discussed with him the options of the suppository and topical therapy which he previously has tried and has had no relief. When he gets in [place] card, I will consider referring him to surgery for more definitive therapy.

## 2011-07-26 ENCOUNTER — Telehealth: Payer: Self-pay | Admitting: *Deleted

## 2011-07-26 LAB — TESTOSTERONE: Testosterone: 358.28 ng/dL (ref 300–890)

## 2011-07-26 LAB — PSA: PSA: 2.83 ng/mL (ref <=4.00)

## 2011-07-26 NOTE — Telephone Encounter (Signed)
Message copied by Macy Mis on Fri Jul 26, 2011  2:43 PM ------      Message from: Gardiner Barefoot      Created: Fri Jul 26, 2011  8:37 AM       I also forgot to schedule him for a follow up in about 1 month to monitor the Paxil and depression  If that can be arranged.  OK to overbook.

## 2011-07-26 NOTE — Telephone Encounter (Signed)
Called patient and scheduled appt for f/u with Dr. Luciana Axe and a nurse visit for ear lavage. Albert Brown

## 2011-07-26 NOTE — Telephone Encounter (Signed)
Message given to [facility] to look up patient's next dental visit.  They will call patient with appointment info. Wendall Mola CMA

## 2011-07-30 ENCOUNTER — Ambulatory Visit: Payer: Self-pay

## 2011-07-31 ENCOUNTER — Ambulatory Visit (INDEPENDENT_AMBULATORY_CARE_PROVIDER_SITE_OTHER): Payer: Self-pay | Admitting: *Deleted

## 2011-07-31 DIAGNOSIS — H6121 Impacted cerumen, right ear: Secondary | ICD-10-CM

## 2011-07-31 DIAGNOSIS — H6120 Impacted cerumen, unspecified ear: Secondary | ICD-10-CM

## 2011-07-31 NOTE — Progress Notes (Signed)
Used the automatic lavage machine in his right ear.  Explained to pt how it works & showed him how to insert into ear canal & what to expect. We did this 3 times. I did see a small piece of wax that had fallen onto outer edge of canal. Further lavage & then checked canal with otoscope. Both eardrums visualized without wax or debris. States his hearing is improved. 3 large pieces of wax found in collection tip of disposable earpiece.  He does not push was into his ear with a qtip. States he uses them to clean the out edges only

## 2011-08-27 ENCOUNTER — Encounter: Payer: Self-pay | Admitting: Internal Medicine

## 2011-08-27 ENCOUNTER — Ambulatory Visit (INDEPENDENT_AMBULATORY_CARE_PROVIDER_SITE_OTHER): Payer: Self-pay | Admitting: Internal Medicine

## 2011-08-27 VITALS — BP 128/79 | HR 64 | Temp 98.1°F | Ht 68.0 in | Wt 177.0 lb

## 2011-08-27 DIAGNOSIS — F411 Generalized anxiety disorder: Secondary | ICD-10-CM

## 2011-08-27 MED ORDER — TRAZODONE HCL 50 MG PO TABS: 50.0000 mg | ORAL_TABLET | Freq: Every day | ORAL | Status: DC

## 2011-08-27 NOTE — Progress Notes (Signed)
  Subjective:    Patient ID: Albert Brown, male    DOB: [birth year], 56 y.o.   MRN: [id number]  HPI He comes in for followup of his depression. His last visit he complained of persistent depression which makes it difficult to do activities that he usually enjoys. He also though now describes much more anxiety. He also tells me that he has never slept well. He rarely sleeps more than a few hours at a time. He was given a prescription for Paxil however due to the activating effect, he did not fill it.   Review of Systems  Constitutional: Positive for activity change and fatigue. Negative for unexpected weight change.  Psychiatric/Behavioral: Positive for disturbed wake/sleep cycle and dysphoric mood. Negative for suicidal ideas, self-injury and decreased concentration. The patient is nervous/anxious.        Objective:   Physical Exam        Assessment & Plan:

## 2011-08-27 NOTE — Assessment & Plan Note (Signed)
He does have continued problems with anxiety. He declines again to go to the mental health counseling. I will try trazodone to help with sleep. She will return in one month to see if that has helped.

## 2011-09-10 ENCOUNTER — Ambulatory Visit (INDEPENDENT_AMBULATORY_CARE_PROVIDER_SITE_OTHER): Payer: Self-pay | Admitting: *Deleted

## 2011-09-10 ENCOUNTER — Telehealth: Payer: Self-pay | Admitting: *Deleted

## 2011-09-10 DIAGNOSIS — B20 Human immunodeficiency virus [HIV] disease: Secondary | ICD-10-CM

## 2011-09-10 DIAGNOSIS — Z23 Encounter for immunization: Secondary | ICD-10-CM

## 2011-09-10 DIAGNOSIS — Z21 Asymptomatic human immunodeficiency virus [HIV] infection status: Secondary | ICD-10-CM

## 2011-09-10 NOTE — Telephone Encounter (Signed)
Patient called stating he stepped on a nail and needs a tetanus shot.  He will come in this afternoon. Wendall Mola CMA

## 2011-09-12 ENCOUNTER — Ambulatory Visit: Payer: Self-pay

## 2011-09-26 ENCOUNTER — Ambulatory Visit: Payer: Self-pay | Admitting: Internal Medicine

## 2011-09-30 ENCOUNTER — Other Ambulatory Visit: Payer: Self-pay | Admitting: *Deleted

## 2011-09-30 DIAGNOSIS — B20 Human immunodeficiency virus [HIV] disease: Secondary | ICD-10-CM

## 2011-09-30 MED ORDER — EFAVIRENZ-EMTRICITAB-TENOFOVIR 600-200-300 MG PO TABS: 1.0000 | ORAL_TABLET | Freq: Every day | ORAL | Status: DC

## 2011-10-03 ENCOUNTER — Encounter: Payer: Self-pay | Admitting: Internal Medicine

## 2011-10-03 ENCOUNTER — Ambulatory Visit (INDEPENDENT_AMBULATORY_CARE_PROVIDER_SITE_OTHER): Payer: Self-pay | Admitting: Internal Medicine

## 2011-10-03 ENCOUNTER — Ambulatory Visit: Payer: Self-pay

## 2011-10-03 VITALS — BP 134/79 | HR 71 | Temp 98.4°F | Ht 68.0 in | Wt 171.0 lb

## 2011-10-03 DIAGNOSIS — F329 Major depressive disorder, single episode, unspecified: Secondary | ICD-10-CM

## 2011-10-03 DIAGNOSIS — B20 Human immunodeficiency virus [HIV] disease: Secondary | ICD-10-CM

## 2011-10-03 MED ORDER — TRAZODONE HCL 50 MG PO TABS: 50.0000 mg | ORAL_TABLET | Freq: Every day | ORAL | Status: DC

## 2011-10-03 NOTE — Progress Notes (Signed)
  Subjective:    Patient ID: Albert Brown, male    DOB: [birth year], 56 y.o.   MRN: [id number]  HPI He comes in for followup of his depression and insomnia. After his last visit, I did start him on trazodone. He started with 50 mg and states he has noted some mild improvement in his mood at work. He did not notice any improvement in his sleep. He does not feel groggy and otherwise does not feel it is negativly affecting him.     Review of Systems  Constitutional: Positive for fatigue. Negative for unexpected weight change.  Neurological: Negative for dizziness and headaches.  Psychiatric/Behavioral: Positive for disturbed wake/sleep cycle and dysphoric mood. Negative for suicidal ideas and self-injury. The patient is nervous/anxious.        Objective:   Physical Exam  Constitutional: He appears well-developed and well-nourished. No distress.  Neurological: He is alert.  Psychiatric: He has a normal mood and affect. His behavior is normal. Judgment normal.          Assessment & Plan:

## 2011-10-03 NOTE — Assessment & Plan Note (Signed)
He does have some improvement in his mood at work and therefore we'll continue with the trazodone. He does not feel there has been any significant negative effects. I did suggest trying 100 mg for a week to see if that does improve his sleep. He is going to decide if he wants to do that at some point. Next  I also referred him to on the HIV support group.

## 2011-10-16 ENCOUNTER — Ambulatory Visit: Payer: Self-pay

## 2011-10-31 ENCOUNTER — Other Ambulatory Visit: Payer: Self-pay

## 2011-11-14 ENCOUNTER — Ambulatory Visit: Payer: Self-pay | Admitting: Internal Medicine

## 2011-11-18 ENCOUNTER — Other Ambulatory Visit (INDEPENDENT_AMBULATORY_CARE_PROVIDER_SITE_OTHER): Payer: Self-pay

## 2011-11-18 DIAGNOSIS — B20 Human immunodeficiency virus [HIV] disease: Secondary | ICD-10-CM

## 2011-11-19 LAB — COMPLETE METABOLIC PANEL WITH GFR
Albumin: 4.5 g/dL (ref 3.5–5.2)
BUN: 16 mg/dL (ref 6–23)
CO2: 29 mEq/L (ref 19–32)
GFR, Est African American: 86 mL/min
GFR, Est Non African American: 75 mL/min
Glucose, Bld: 77 mg/dL (ref 70–99)
Potassium: 5.3 mEq/L (ref 3.5–5.3)
Sodium: 142 mEq/L (ref 135–145)
Total Protein: 7.2 g/dL (ref 6.0–8.3)

## 2011-11-19 LAB — CBC WITH DIFFERENTIAL/PLATELET
Eosinophils Absolute: 0.1 10*3/uL (ref 0.0–0.7)
HCT: 46.2 % (ref 39.0–52.0)
Hemoglobin: 15.6 g/dL (ref 13.0–17.0)
Lymphs Abs: 1.6 10*3/uL (ref 0.7–4.0)
MCH: 30.8 pg (ref 26.0–34.0)
MCHC: 33.8 g/dL (ref 30.0–36.0)
Monocytes Absolute: 0.6 10*3/uL (ref 0.1–1.0)
Monocytes Relative: 9 % (ref 3–12)
Neutro Abs: 4.9 10*3/uL (ref 1.7–7.7)
Neutrophils Relative %: 67 % (ref 43–77)
RBC: 5.07 MIL/uL (ref 4.22–5.81)

## 2011-11-19 LAB — T-HELPER CELL (CD4) - (RCID CLINIC ONLY): CD4 T Cell Abs: 690 uL (ref 400–2700)

## 2011-11-19 LAB — HIV-1 RNA QUANT-NO REFLEX-BLD: HIV 1 RNA Quant: <20 copies/mL (ref <20)

## 2011-11-25 ENCOUNTER — Telehealth: Payer: Self-pay | Admitting: *Deleted

## 2011-11-25 NOTE — Telephone Encounter (Signed)
Patient came to the clinic to advise he has PAP for his androgel and he is almost out and he wanted to have it refilled. Advised patient will inform Pam our PAP coordinator. She will reorder his medication tomorrow when she gets in and if she has any questions she will call him.

## 2011-11-28 ENCOUNTER — Other Ambulatory Visit: Payer: Self-pay | Admitting: Licensed Clinical Social Worker

## 2011-11-28 DIAGNOSIS — E291 Testicular hypofunction: Secondary | ICD-10-CM

## 2011-11-28 MED ORDER — TESTOSTERONE 50 MG/5GM (1%) TD GEL: 5.0000 g | Freq: Every day | TRANSDERMAL | Status: DC

## 2011-12-02 ENCOUNTER — Telehealth: Payer: Self-pay | Admitting: Infectious Diseases

## 2011-12-02 NOTE — Telephone Encounter (Signed)
Faxed Albert Brown's application for AndroGel to [employer] today.

## 2012-03-03 ENCOUNTER — Encounter: Payer: Self-pay | Admitting: Internal Medicine

## 2012-03-03 ENCOUNTER — Ambulatory Visit (INDEPENDENT_AMBULATORY_CARE_PROVIDER_SITE_OTHER): Payer: No Typology Code available for payment source | Admitting: Internal Medicine

## 2012-03-03 VITALS — BP 139/87 | HR 69 | Temp 98.3°F | Ht 68.0 in | Wt 175.0 lb

## 2012-03-03 DIAGNOSIS — G4700 Insomnia, unspecified: Secondary | ICD-10-CM

## 2012-03-03 DIAGNOSIS — K219 Gastro-esophageal reflux disease without esophagitis: Secondary | ICD-10-CM

## 2012-03-03 DIAGNOSIS — F411 Generalized anxiety disorder: Secondary | ICD-10-CM

## 2012-03-03 DIAGNOSIS — B20 Human immunodeficiency virus [HIV] disease: Secondary | ICD-10-CM

## 2012-03-03 DIAGNOSIS — H6120 Impacted cerumen, unspecified ear: Secondary | ICD-10-CM

## 2012-03-03 MED ORDER — OMEPRAZOLE 20 MG PO CPDR: 20.0000 mg | DELAYED_RELEASE_CAPSULE | Freq: Every day | ORAL | Status: DC
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
## 2012-03-18 ENCOUNTER — Telehealth: Payer: Self-pay | Admitting: *Deleted

## 2012-03-18 DIAGNOSIS — E291 Testicular hypofunction: Secondary | ICD-10-CM

## 2012-03-18 MED ORDER — TESTOSTERONE 50 MG/5GM (1%) TD GEL: 5.0000 g | Freq: Every day | TRANSDERMAL | Status: DC

## 2012-03-18 NOTE — Telephone Encounter (Signed)
Needing printed rx for PAP application.

## 2012-03-25 ENCOUNTER — Telehealth: Payer: Self-pay | Admitting: Infectious Diseases

## 2012-03-25 NOTE — Telephone Encounter (Signed)
Faxed and mailed Albert Brown's prescription to [facility] today.

## 2012-03-31 ENCOUNTER — Telehealth: Payer: Self-pay | Admitting: Infectious Diseases

## 2012-03-31 NOTE — Telephone Encounter (Signed)
Called [employer] to check status on Mr. Alberto's Androgel.  The prescription has been received.

## 2012-04-02 ENCOUNTER — Telehealth: Payer: Self-pay | Admitting: Infectious Diseases

## 2012-04-02 ENCOUNTER — Telehealth: Payer: Self-pay | Admitting: *Deleted

## 2012-04-02 NOTE — Telephone Encounter (Signed)
Needing % concentration for Androgel rx.  MD, please advise.

## 2012-04-02 NOTE — Telephone Encounter (Signed)
Called [employer] again about Mr. Weatherly's androgel.  It is being processed now.  Per Chyrl Civatte I need to call back tomorrow to check on shipping date.  I told her I am not here on Friday and will call Monday

## 2012-04-06 ENCOUNTER — Other Ambulatory Visit: Payer: Self-pay | Admitting: Infectious Diseases

## 2012-04-06 ENCOUNTER — Other Ambulatory Visit: Payer: Self-pay | Admitting: *Deleted

## 2012-04-06 DIAGNOSIS — E291 Testicular hypofunction: Secondary | ICD-10-CM

## 2012-04-06 MED ORDER — TESTOSTERONE 50 MG/5GM (1%) TD GEL: 5.0000 g | Freq: Every day | TRANSDERMAL | Status: DC

## 2012-04-13 ENCOUNTER — Telehealth: Payer: Self-pay | Admitting: Internal Medicine

## 2012-04-13 NOTE — Telephone Encounter (Signed)
Received call from Albert Brown wanting to know why his Androgel was sent to [facility] pharmacy instead of to his house.  I called [employer] Patient [employer].  I was told the medication was sent to [address], [place] and not to the pharmacy.  I called Albert Brown back and told him this.  He insists the medication was sent to [facility].  I told him it was shipped to his house on 2-11.  He will wait a few more days for it to arrive.

## 2012-05-26 ENCOUNTER — Other Ambulatory Visit: Payer: Self-pay | Admitting: *Deleted

## 2012-05-26 ENCOUNTER — Ambulatory Visit (INDEPENDENT_AMBULATORY_CARE_PROVIDER_SITE_OTHER): Payer: Self-pay | Admitting: *Deleted

## 2012-05-26 DIAGNOSIS — R369 Urethral discharge, unspecified: Secondary | ICD-10-CM

## 2012-05-26 LAB — URINALYSIS, ROUTINE W REFLEX MICROSCOPIC
Bilirubin Urine: NEGATIVE
Nitrite: NEGATIVE
Specific Gravity, Urine: 1.022 (ref 1.005–1.030)
Urobilinogen, UA: 0.2 mg/dL (ref 0.0–1.0)
pH: 7 (ref 5.0–8.0)

## 2012-05-26 MED ORDER — AZITHROMYCIN 250 MG PO TABS
1000.0000 mg | ORAL_TABLET | Freq: Once | ORAL | Status: DC
  Administered 2012-05-26: 1000 mg via ORAL

## 2012-05-26 MED ORDER — CEFTRIAXONE SODIUM 1 G IJ SOLR
250.0000 mg | Freq: Once | INTRAMUSCULAR | Status: DC
  Administered 2012-05-26: 250 mg via INTRAMUSCULAR

## 2012-05-27 ENCOUNTER — Other Ambulatory Visit (INDEPENDENT_AMBULATORY_CARE_PROVIDER_SITE_OTHER): Payer: Self-pay

## 2012-05-27 ENCOUNTER — Ambulatory Visit: Payer: Self-pay | Admitting: Infectious Diseases

## 2012-05-27 ENCOUNTER — Ambulatory Visit: Payer: Self-pay

## 2012-05-27 ENCOUNTER — Other Ambulatory Visit: Payer: Self-pay | Admitting: Infectious Diseases

## 2012-05-27 DIAGNOSIS — B20 Human immunodeficiency virus [HIV] disease: Secondary | ICD-10-CM

## 2012-05-27 DIAGNOSIS — R369 Urethral discharge, unspecified: Secondary | ICD-10-CM

## 2012-05-27 DIAGNOSIS — Z113 Encounter for screening for infections with a predominantly sexual mode of transmission: Secondary | ICD-10-CM

## 2012-05-27 LAB — CBC WITH DIFFERENTIAL/PLATELET
Basophils Absolute: 0 K/uL (ref 0.0–0.1)
Basophils Relative: 0 % (ref 0–1)
Eosinophils Absolute: 0.2 K/uL (ref 0.0–0.7)
Eosinophils Relative: 3 % (ref 0–5)
HCT: 42.2 % (ref 39.0–52.0)
Hemoglobin: 14.4 g/dL (ref 13.0–17.0)
Lymphocytes Relative: 30 % (ref 12–46)
Lymphs Abs: 1.7 K/uL (ref 0.7–4.0)
MCH: 30.8 pg (ref 26.0–34.0)
MCHC: 34.1 g/dL (ref 30.0–36.0)
MCV: 90.2 fL (ref 78.0–100.0)
Monocytes Absolute: 0.5 K/uL (ref 0.1–1.0)
Monocytes Relative: 9 % (ref 3–12)
Neutro Abs: 3.3 K/uL (ref 1.7–7.7)
Neutrophils Relative %: 58 % (ref 43–77)
Platelets: 190 K/uL (ref 150–400)
RBC: 4.68 MIL/uL (ref 4.22–5.81)
RDW: 13.6 % (ref 11.5–15.5)
WBC: 5.8 K/uL (ref 4.0–10.5)

## 2012-05-27 LAB — RPR

## 2012-05-28 LAB — COMPLETE METABOLIC PANEL WITH GFR
ALT: 27 U/L (ref 0–53)
AST: 19 U/L (ref 0–37)
Albumin: 4.6 g/dL (ref 3.5–5.2)
Alkaline Phosphatase: 79 U/L (ref 39–117)
BUN: 23 mg/dL (ref 6–23)
Potassium: 4.2 mEq/L (ref 3.5–5.3)
Sodium: 138 mEq/L (ref 135–145)
Total Protein: 7.1 g/dL (ref 6.0–8.3)

## 2012-05-28 LAB — T-HELPER CELL (CD4) - (RCID CLINIC ONLY): CD4 T Cell Abs: 610 uL (ref 400–2700)

## 2012-05-29 LAB — HIV-1 RNA QUANT-NO REFLEX-BLD
HIV 1 RNA Quant: 36 copies/mL — ABNORMAL HIGH (ref <20)
HIV-1 RNA Quant, Log: 1.56 {Log} — ABNORMAL HIGH (ref <1.30)

## 2012-06-02 ENCOUNTER — Other Ambulatory Visit: Payer: Self-pay

## 2012-06-16 ENCOUNTER — Ambulatory Visit: Payer: Self-pay | Admitting: Internal Medicine

## 2012-06-30 ENCOUNTER — Encounter: Payer: Self-pay | Admitting: Internal Medicine

## 2012-06-30 ENCOUNTER — Ambulatory Visit (INDEPENDENT_AMBULATORY_CARE_PROVIDER_SITE_OTHER): Payer: Self-pay | Admitting: Internal Medicine

## 2012-06-30 VITALS — BP 145/91 | HR 76 | Temp 98.1°F | Wt 181.0 lb

## 2012-06-30 DIAGNOSIS — B20 Human immunodeficiency virus [HIV] disease: Secondary | ICD-10-CM

## 2012-06-30 DIAGNOSIS — K219 Gastro-esophageal reflux disease without esophagitis: Secondary | ICD-10-CM

## 2012-06-30 DIAGNOSIS — G4700 Insomnia, unspecified: Secondary | ICD-10-CM

## 2012-06-30 DIAGNOSIS — F4323 Adjustment disorder with mixed anxiety and depressed mood: Secondary | ICD-10-CM

## 2012-06-30 MED ORDER — ELVITEG-COBIC-EMTRICIT-TENOFDF 150-150-200-300 MG PO TABS: 1.0000 | ORAL_TABLET | Freq: Every day | ORAL | Status: DC

## 2012-06-30 MED ORDER — TRAZODONE HCL 50 MG PO TABS: 50.0000 mg | ORAL_TABLET | Freq: Every day | ORAL | Status: DC

## 2012-06-30 NOTE — Progress Notes (Signed)
[facility] HIV CLINIC NOTE  RFV: routine visit Subjective:    Patient ID: Albert Brown, male    DOB: 1955-12-03, 57 y.o.   MRN: [id number]  HPI 57yo Male with HIV, CD 4 count of 610/VL 36, currently on atripla.not missing doses. Continues to have vivid dreams with frequently waking up at night and unable to go back to sleep. Sleeping 4-6 hr per night.  Heart burn somewhat bad lately. Feeling up to base on throat. Takes tums. Unable to afford prilosec. Takes maalox. Previously on protonix through patient assistance  [facility] = conitnues to go counseling but feels not gaining any improvement. Fearful of taking meds since had poor experience with lexapro("coco for cocopuffs") and wellbutrin (mean)  Current Outpatient Prescriptions on File Prior to Visit  Medication Sig Dispense Refill  . efavirenz-emtricitabine-tenofovir (ATRIPLA) 600-200-300 MG per tablet Take 1 tablet by mouth at bedtime.  30 tablet  11  . testosterone (ANDROGEL) 50 MG/5GM GEL Place 5 g onto the skin daily. 1%  30 Package  5  . escitalopram (LEXAPRO) 10 MG tablet Take 10 mg by mouth daily.      Marland Kitchen omeprazole (PRILOSEC) 20 MG capsule Take 1 capsule (20 mg total) by mouth daily.  30 capsule  11   No current facility-administered medications on file prior to visit.   Active Ambulatory Problems    Diagnosis Date Noted  . HIV DISEASE 08/04/2006  . HYPOGONADISM 09/20/2009  . ANXIETY STATE, UNSPECIFIED 09/20/2009  . DEPRESSION 08/04/2006  . PERIPHERAL NEUROPATHY 08/04/2006  . EXTERNAL HEMORRHOIDS WITHOUT MENTION COMP 09/15/2008  . PHARYNGITIS, RECURRENT 10/06/2008  . ALLERGIC RHINITIS 08/04/2006  . GERD 08/04/2006  . VERTIGO 08/04/2006  . DIARRHEA 09/15/2008  . URINARY FREQUENCY 03/16/2009  . Periodontal disease 08/21/2010  . Allergic sinusitis 08/21/2010  . Cerumen impaction, right 07/25/2011   Resolved Ambulatory Problems    Diagnosis Date Noted  . No Resolved Ambulatory Problems   Past Medical History  Diagnosis  Date  . HIV infection    Family and social hx are unchanged   Review of Systems Review of Systems  Constitutional: Negative for fever, chills, diaphoresis, activity change, appetite change, fatigue and unexpected weight change.  HENT: Negative for congestion, sore throat, rhinorrhea, sneezing, trouble swallowing and sinus pressure.  Eyes: Negative for photophobia and visual disturbance.  Respiratory: Negative for cough, chest tightness, shortness of breath, wheezing and stridor.  Cardiovascular: Negative for chest pain, palpitations and leg swelling.  Gastrointestinal: Negative for nausea, vomiting, abdominal pain, diarrhea, constipation, blood in stool, abdominal distention and anal bleeding.  Genitourinary: Negative for dysuria, hematuria, flank pain and difficulty urinating.  Musculoskeletal: Negative for myalgias, back pain, joint swelling, arthralgias and gait problem.  Skin: Negative for color change, pallor, rash and wound.  Neurological: Negative for dizziness, tremors, weakness and light-headedness.  Hematological: Negative for adenopathy. Does not bruise/bleed easily.  Psychiatric/Behavioral: " fearful to do anything". ; poor sleep      Objective:   Physical Exam BP 145/91  Pulse 76  Temp(Src) 98.1 F (36.7 C) (Oral)  Wt 181 lb (82.101 kg)  BMI 27.53 kg/m2 Physical Exam  Constitutional: He is oriented to person, place, and time. He appears well-developed and well-nourished. No distress.  HENT:  Mouth/Throat: Oropharynx is clear and moist. No oropharyngeal exudate.  Cardiovascular: Normal rate, regular rhythm and normal heart sounds. Exam reveals no gallop and no friction rub.  No murmur heard.  Pulmonary/Chest: Effort normal and breath sounds normal. No respiratory distress. He has no wheezes.  Abdominal: Soft. Bowel sounds are normal. He exhibits no distension. There is no tenderness.  Lymphadenopathy:  He has no cervical adenopathy.  Neurological: He is alert and  oriented to person, place, and time.  Skin: Skin is warm and dry. No rash noted. No erythema.  Psychiatric: slightly flat affect     Assessment & Plan:  HIV = low level viremia, could be a blip. Will repeat blood work in 3 months. Is willing to change to stribild since he is having vivid dreams side effects  Insomnia = has trazadone but uses sparingly, will refill  Depression = goes to [facility] for counseling. Had bad experience with lexapro and is scared to use something else. Have arranged for him to see kenny in 3 wks. Did introduction today  GERD = will attempt to patient assistance program.    rtc in 3 months

## 2012-07-16 ENCOUNTER — Telehealth: Payer: Self-pay | Admitting: Internal Medicine

## 2012-07-16 NOTE — Telephone Encounter (Signed)
Mailed and faxed application to [employer] today

## 2012-07-21 ENCOUNTER — Ambulatory Visit: Payer: Self-pay

## 2012-07-21 ENCOUNTER — Telehealth: Payer: Self-pay | Admitting: *Deleted

## 2012-07-21 DIAGNOSIS — F331 Major depressive disorder, recurrent, moderate: Secondary | ICD-10-CM

## 2012-07-21 DIAGNOSIS — F411 Generalized anxiety disorder: Secondary | ICD-10-CM

## 2012-07-21 NOTE — Telephone Encounter (Signed)
Pt in for visit with Harlene Ramus, requested to speak with an RN before he left.  Pt reports intermittent twitching in his left eye x 1 week.  States he feels like "something is caught inside his upper lid and he needs to readjust his eyeball in it's socket" throughout the day.  Pt denies any change in his vision.  Upon visual examination, no discharge, redness or swelling noticed.  Pt admits to sleeping only 4 hours a night.  RN advised pt to apply a warm compress with his eyes closed throughout the day.  Pt given appointment 08/03/12 with Dr. Ninetta Lights, advised to call if he develops any redness, drainage or swelling.  If unable to contact us when this happens, or if he develops any visual changes, pt advised to go to the ED as he has neither insurance or a PCP.  Pt verbalized understanding. Andree Coss, RN
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
## 2012-07-27 ENCOUNTER — Telehealth: Payer: Self-pay | Admitting: Licensed Clinical Social Worker

## 2012-07-27 NOTE — Telephone Encounter (Signed)
Patient's protonix arrived today from patient assistance. There is no number on file for this patient so I could not notify him.

## 2012-08-03 ENCOUNTER — Ambulatory Visit (INDEPENDENT_AMBULATORY_CARE_PROVIDER_SITE_OTHER): Payer: Self-pay | Admitting: Infectious Diseases

## 2012-08-03 ENCOUNTER — Encounter: Payer: Self-pay | Admitting: Infectious Diseases

## 2012-08-03 VITALS — BP 146/84 | HR 71 | Temp 98.2°F | Ht 68.0 in | Wt 183.0 lb

## 2012-08-03 DIAGNOSIS — H11009 Unspecified pterygium of unspecified eye: Secondary | ICD-10-CM

## 2012-08-03 DIAGNOSIS — B20 Human immunodeficiency virus [HIV] disease: Secondary | ICD-10-CM

## 2012-08-03 DIAGNOSIS — H11002 Unspecified pterygium of left eye: Secondary | ICD-10-CM | POA: Insufficient documentation

## 2012-08-03 NOTE — Assessment & Plan Note (Signed)
Continues to do well. Offered, refused.

## 2012-08-03 NOTE — Progress Notes (Signed)
  Subjective:    Patient ID: Albert Brown, male    DOB: [birth year], 57 y.o.   MRN: [id number]  HPI 57 yo M with HIV since 2003. Taking stribilid. "No problems so far." Was started on July 26 2012. Was prev on atripla, changed due to abn dreams.  Has been problem with L eye for 2-3 weeks. Has feeling of foreign body under his lid. Eye lid fluttering. Occas change in his vision (states he has floaters anyway). No d/c. Has had rhinorrhea. No cough. No fevers or night sweats. Has used no eye drops.   HIV 1 RNA Quant (copies/mL)  Date Value  05/27/2012 36*  11/18/2011 <20   07/11/2011 <20      CD4 T Cell Abs (cmm)  Date Value  05/27/2012 610   11/18/2011 690   07/11/2011 650       Review of Systems     Objective:   Physical Exam  Constitutional: He appears well-developed and well-nourished.  Eyes: Conjunctivae and EOM are normal. Pupils are equal, round, and reactive to light. Left eye exhibits no exudate and no hordeolum.            Assessment & Plan:

## 2012-08-03 NOTE — Assessment & Plan Note (Signed)
Have asked him to apply normal saline, bid. Will try to get him into ophtho.  O/w rtc as regularly scheduled.

## 2012-08-04 ENCOUNTER — Telehealth: Payer: Self-pay | Admitting: *Deleted

## 2012-08-04 NOTE — Telephone Encounter (Signed)
Received letter from [employer].  Albert Brown has been approved for his Protonix.  Will notify patient.

## 2012-08-13 ENCOUNTER — Ambulatory Visit: Payer: Self-pay

## 2012-08-13 DIAGNOSIS — F331 Major depressive disorder, recurrent, moderate: Secondary | ICD-10-CM

## 2012-08-13 DIAGNOSIS — F411 Generalized anxiety disorder: Secondary | ICD-10-CM

## 2012-08-13 NOTE — Progress Notes (Signed)
Albert Brown and I completed a treatment plan today.  He continues to report poor sleep, depressed mood, and daily anxiety.  I provided psycho-education on breathing techniques to reduce anxiety and pointers on getting better sleep.  Plan to meet in 1 week.

## 2012-08-20 ENCOUNTER — Ambulatory Visit: Payer: Self-pay

## 2012-08-20 DIAGNOSIS — F331 Major depressive disorder, recurrent, moderate: Secondary | ICD-10-CM

## 2012-08-20 DIAGNOSIS — F411 Generalized anxiety disorder: Secondary | ICD-10-CM

## 2012-08-20 NOTE — Progress Notes (Signed)
Albert Brown came in saying that he feels he is in a rut.  When I asked what he would like to change in his life he said his social life.  He said he has no energy when he gets home and so he doesn't make an effort to meet new people or even leave the house.  We reviewed cognitive behavioral therapy concepts - how thoughts impact feelings.  He does feel like he is a positive influence on people at his job.  He said he is thinking about going to [employer], but hasn't done so yet.  I also introduced him to mindfulness and gave him some resources to check into about this.

## 2012-08-25 ENCOUNTER — Telehealth: Payer: Self-pay | Admitting: *Deleted

## 2012-08-25 NOTE — Telephone Encounter (Signed)
[facility] referral sent for ophthalmology re: pterygium in Left eye

## 2012-09-02 ENCOUNTER — Other Ambulatory Visit: Payer: Self-pay | Admitting: *Deleted

## 2012-09-02 DIAGNOSIS — E291 Testicular hypofunction: Secondary | ICD-10-CM

## 2012-09-02 MED ORDER — TESTOSTERONE 50 MG/5GM (1%) TD GEL: 5.0000 g | TRANSDERMAL | Status: DC

## 2012-09-02 NOTE — Telephone Encounter (Signed)
PAP application 

## 2012-09-03 ENCOUNTER — Ambulatory Visit: Payer: Self-pay

## 2012-09-03 DIAGNOSIS — F331 Major depressive disorder, recurrent, moderate: Secondary | ICD-10-CM

## 2012-09-03 DIAGNOSIS — F411 Generalized anxiety disorder: Secondary | ICD-10-CM

## 2012-09-03 NOTE — Progress Notes (Signed)
Albert Brown was pleasant today, but continues to have some physical complaints and wonders if it is the Stribild that is causing this.  He says his libido has been down and this worries him.  He also reported blood in his stool and I suggested he talk with nursing staff about this, but he said he would wait until his MD appointment, because he doesn't want to keep bothering staff here about his problems.  He also talked about his bouts with depressed mood and anxiety.  I provided more psycho-education on ways to challenge negative thoughts.  He said he is trying to breathe to calm himself down more and is also walking for exercise.  He worries about his weight as well and feels that if he lost 20 lbs he would feel better.  Plan to meet in 3 weeks.

## 2012-09-07 ENCOUNTER — Ambulatory Visit (INDEPENDENT_AMBULATORY_CARE_PROVIDER_SITE_OTHER): Payer: No Typology Code available for payment source | Admitting: Internal Medicine

## 2012-09-07 ENCOUNTER — Encounter: Payer: Self-pay | Admitting: Internal Medicine

## 2012-09-07 VITALS — BP 125/79 | HR 70 | Temp 98.1°F | Ht 68.0 in | Wt 184.0 lb

## 2012-09-07 DIAGNOSIS — B20 Human immunodeficiency virus [HIV] disease: Secondary | ICD-10-CM

## 2012-09-07 LAB — CBC WITH DIFFERENTIAL/PLATELET
Eosinophils Absolute: 0.2 10*3/uL (ref 0.0–0.7)
HCT: 43.2 % (ref 39.0–52.0)
Hemoglobin: 14.8 g/dL (ref 13.0–17.0)
Lymphs Abs: 2 10*3/uL (ref 0.7–4.0)
MCH: 30.3 pg (ref 26.0–34.0)
Monocytes Relative: 10 % (ref 3–12)
Neutrophils Relative %: 53 % (ref 43–77)
RBC: 4.89 MIL/uL (ref 4.22–5.81)

## 2012-09-07 LAB — COMPREHENSIVE METABOLIC PANEL
Albumin: 4.6 g/dL (ref 3.5–5.2)
CO2: 28 mEq/L (ref 19–32)
Glucose, Bld: 78 mg/dL (ref 70–99)
Potassium: 4.9 mEq/L (ref 3.5–5.3)
Sodium: 137 mEq/L (ref 135–145)
Total Bilirubin: 0.4 mg/dL (ref 0.3–1.2)
Total Protein: 7.1 g/dL (ref 6.0–8.3)

## 2012-09-07 NOTE — Progress Notes (Signed)
[facility] NOTE  RFV: routine Subjective:    Patient ID: Albert Brown, male    DOB: 1955/04/11, 57 y.o.   MRN: [id number]  HPI 57yo Male with HIV, CD 4 cont of 610/VL 30 (in April) switched to stribild in May. Having some weightgain of 3 lbs since May. He has been working out and watching intake but despite these interventions still having weight gain which he is not pleased with. In addition he feels that his libido is decreased, questioning if this is related to stribild. He is able to wake up with erection which is no different. Having difficulty with getting an erection, and maintaining it. Only using androgel for now. He is considering using infomercial drugs to help with his sexual drive. He denies having worsening depression.  Ros: 2-5 days bright red blood in toilet bowel has history of hemorroids. No pain with wiping. Now resolved x 2 days. Pterygium on left eye still bothersome  Current Outpatient Prescriptions on File Prior to Visit  Medication Sig Dispense Refill  . elvitegravir-cobicistat-emtricitabine-tenofovir (STRIBILD) 150-150-200-300 MG TABS Take 1 tablet by mouth daily with breakfast.  30 tablet  11  . testosterone (ANDROGEL) 50 MG/5GM GEL Place 5 g onto the skin 3 (three) times a week. 1%  60 Package  5  . traZODone (DESYREL) 50 MG tablet Take 1 tablet (50 mg total) by mouth at bedtime.  30 tablet  5   No current facility-administered medications on file prior to visit.   Active Ambulatory Problems    Diagnosis Date Noted  . HIV DISEASE 08/04/2006  . HYPOGONADISM 09/20/2009  . ANXIETY STATE, UNSPECIFIED 09/20/2009  . DEPRESSION 08/04/2006  . PERIPHERAL NEUROPATHY 08/04/2006  . EXTERNAL HEMORRHOIDS WITHOUT MENTION COMP 09/15/2008  . PHARYNGITIS, RECURRENT 10/06/2008  . ALLERGIC RHINITIS 08/04/2006  . GERD 08/04/2006  . VERTIGO 08/04/2006  . DIARRHEA 09/15/2008  . URINARY FREQUENCY 03/16/2009  . Periodontal disease 08/21/2010  . Allergic sinusitis 08/21/2010   . Cerumen impaction, right 07/25/2011  . Pterygium of left eye 08/03/2012   Resolved Ambulatory Problems    Diagnosis Date Noted  . No Resolved Ambulatory Problems   Past Medical History  Diagnosis Date  . HIV infection    History  Substance Use Topics  . Smoking status: Former Smoker    Types: Cigarettes    Quit date: 03/02/2007  . Smokeless tobacco: Never Used  . Alcohol Use: Yes     Comment: weekends     Review of Systems     Objective:   Physical Exam BP 125/79  Pulse 70  Temp(Src) 98.1 F (36.7 C) (Oral)  Ht 5\' 8"  (1.727 m)  Wt 184 lb (83.462 kg)  BMI 27.98 kg/m2 Physical Exam  Constitutional: He is oriented to person, place, and time. He appears well-developed and well-nourished. No distress.  HENT: left eye pterygium unchanged Mouth/Throat: Oropharynx is clear and moist. No oropharyngeal exudate.  Cardiovascular: Normal rate, regular rhythm and normal heart sounds. Exam reveals no gallop and no friction rub.  No murmur heard.  Pulmonary/Chest: Effort normal and breath sounds normal. No respiratory distress. He has no wheezes.  Abdominal: Soft. Bowel sounds are normal. He exhibits no distension. There is no tenderness.  Lymphadenopathy:  He has no cervical adenopathy.  Neurological: He is alert and oriented to person, place, and time.  Skin: Skin is warm and dry. No rash noted. No erythema.  Psychiatric: He has a normal mood and affect. His behavior is normal.  Assessment & Plan:  HIV = continue with stribild. But wants to consider changing back to [place] since he feels that weight gain is associated with stribild.  Weight gain = refer to nutritionist  Blood in stool = now resolved. Likely hemorrhoidal. Gave advice for sitz baths. Anusol. To call back in recurs. Will check cbc  Decrease libido, history of low testosterone = will check testoterone  rtc in 3 months

## 2012-09-09 LAB — T-HELPER CELL (CD4) - (RCID CLINIC ONLY)
CD4 % Helper T Cell: 39 % (ref 33–55)
CD4 T Cell Abs: 720 uL (ref 400–2700)

## 2012-09-09 LAB — HIV-1 RNA QUANT-NO REFLEX-BLD: HIV-1 RNA Quant, Log: <1.3 {Log} (ref <1.30)

## 2012-09-22 ENCOUNTER — Ambulatory Visit: Payer: No Typology Code available for payment source

## 2012-09-22 DIAGNOSIS — F411 Generalized anxiety disorder: Secondary | ICD-10-CM

## 2012-09-22 DIAGNOSIS — F331 Major depressive disorder, recurrent, moderate: Secondary | ICD-10-CM

## 2012-09-22 NOTE — Progress Notes (Signed)
Albert Brown talked about his roommate some today and said he is disgusted with him at times.  He wants him to move out, but doesn't seem to have the heart to kick him out.  I pointed out that he would probably find somewhere to live if he did and he agreed.  He also talked some about work and how he is affected by customers' attitudes, which sometimes aren't good.  He also expressed some frustration with his doctor, because she did not address his medical concerns to his satisfaction.  He said he wants to change doctors but he recently changed so he doesn't want to go through this again.  He said it is very helpful to come here and get things off his chest.

## 2012-09-28 ENCOUNTER — Ambulatory Visit: Payer: No Typology Code available for payment source

## 2012-09-28 ENCOUNTER — Other Ambulatory Visit: Payer: Self-pay

## 2012-10-06 ENCOUNTER — Ambulatory Visit: Payer: No Typology Code available for payment source

## 2012-10-06 DIAGNOSIS — F331 Major depressive disorder, recurrent, moderate: Secondary | ICD-10-CM

## 2012-10-06 DIAGNOSIS — F411 Generalized anxiety disorder: Secondary | ICD-10-CM

## 2012-10-06 NOTE — Progress Notes (Signed)
Coden reported that he had a terrible week off, partly due to the weather, but also because his roommate became sick and had to go to the hospital.  We discussed negative thinking and how easily our minds get bogged down in it.  I provided him with a handout from Allyson Sabal' book, The Feeling Good Handbook on how to "untwist your thinking".  He responded well to this and said he appreciated the pointers and help that I provide him.  He talked about some of the bad experiences he has had over the years with therapists and psychiatrists.  Plan to meet in a week and a half.

## 2012-10-12 ENCOUNTER — Ambulatory Visit: Payer: Self-pay | Admitting: Internal Medicine

## 2012-10-15 ENCOUNTER — Ambulatory Visit: Payer: No Typology Code available for payment source

## 2012-10-15 DIAGNOSIS — F411 Generalized anxiety disorder: Secondary | ICD-10-CM

## 2012-10-15 DIAGNOSIS — F330 Major depressive disorder, recurrent, mild: Secondary | ICD-10-CM

## 2012-10-15 NOTE — Progress Notes (Signed)
Albert Brown was in a fairly pleasant mood today, reporting that things are going along as expected.  He began to talk some about all of the unrest around the world and I suggested that he could take a "news holiday" if it begins to bother him or make him feel more depressed.  He also talked some about his ongoing health issues and thinks that his new HIV medication is causing weight gain and decreased libido.  He expressed that he finds it very helpful to come in and talk about his problems.  No safety issues or concerns.  Plan to meet again in 2 weeks.

## 2012-10-19 ENCOUNTER — Telehealth: Payer: Self-pay | Admitting: *Deleted

## 2012-10-19 NOTE — Telephone Encounter (Signed)
Pt refused ophthalmology referral, stating "it took too long and I guess my eyes just got used to it." Andree Coss, RN

## 2012-10-29 ENCOUNTER — Ambulatory Visit: Payer: No Typology Code available for payment source

## 2012-11-05 ENCOUNTER — Ambulatory Visit: Payer: No Typology Code available for payment source

## 2012-11-05 DIAGNOSIS — F411 Generalized anxiety disorder: Secondary | ICD-10-CM

## 2012-11-05 DIAGNOSIS — F331 Major depressive disorder, recurrent, moderate: Secondary | ICD-10-CM

## 2012-11-05 NOTE — Progress Notes (Signed)
Albert Brown reported today that his depression has been a bit worse lately.  He said he has been having more negative thoughts, mostly about his health - worrying that things are going to be worse than they currently are.  He also reports that he is having some anxiety about financial issues.  He said he recently got a letter from a collections agency from a lab company about a bill he thought [name] would pay.  This is very upsetting to him, he said.  I provided support and listened empathically and he said it was helpful to have someone to talk to about all of this.  He is also tired of his partner living with him and is hopeful that he is going to move out soon.

## 2012-11-12 ENCOUNTER — Ambulatory Visit: Payer: No Typology Code available for payment source

## 2012-11-12 DIAGNOSIS — F330 Major depressive disorder, recurrent, mild: Secondary | ICD-10-CM

## 2012-11-12 NOTE — Progress Notes (Signed)
Albert Brown was pleasant today, but did report that he has been under some stress lately.  He reported that his ex-partner, who still lives with him, is on the verge of moving out and this is something [name] has been wanting for some time now but didn't want to push him out with nowhere to go.  He said he feels like his HIV status makes him "damaged goods" as far as pursuing a relationship, but I encouraged him to discuss with his doctor or nurse the implications of being "undetectable".  He said it is helpful to come in and talk about things that he can't talk to other people about.  Plan to meet in one week.

## 2012-11-19 ENCOUNTER — Other Ambulatory Visit: Payer: No Typology Code available for payment source

## 2012-11-19 ENCOUNTER — Other Ambulatory Visit: Payer: Self-pay | Admitting: Infectious Diseases

## 2012-11-19 ENCOUNTER — Ambulatory Visit: Payer: No Typology Code available for payment source

## 2012-11-19 DIAGNOSIS — B20 Human immunodeficiency virus [HIV] disease: Secondary | ICD-10-CM

## 2012-11-19 DIAGNOSIS — F330 Major depressive disorder, recurrent, mild: Secondary | ICD-10-CM

## 2012-11-19 LAB — COMPREHENSIVE METABOLIC PANEL
ALT: 22 U/L (ref 0–53)
AST: 16 U/L (ref 0–37)
Albumin: 4.4 g/dL (ref 3.5–5.2)
Alkaline Phosphatase: 73 U/L (ref 39–117)
Chloride: 107 mEq/L (ref 96–112)
Glucose, Bld: 84 mg/dL (ref 70–99)
Potassium: 4.7 mEq/L (ref 3.5–5.3)
Sodium: 141 mEq/L (ref 135–145)
Total Bilirubin: 0.3 mg/dL (ref 0.3–1.2)
Total Protein: 7.1 g/dL (ref 6.0–8.3)

## 2012-11-19 NOTE — Progress Notes (Unsigned)
Ilya was pleasant today, reporting that he is glad he will be changing doctors.  He was also glad that the clinic manager was able to clear up a billing issue with the lab.  He then talked about his ex-partner/roommate, who is now saying he doesn't want to move out.  He resents that his roommate can find a good man to be in a relationship and that he seemingly cannot.  I was able to catch Janzen in making some negative statements that were assumptions he could not back up and were affecting him negatively.  He refers to himself as "damaged goods", due to his HIV status, but I pointed out that he is undetectable.  I gave him a list of ways to "untwist" his negative thinking (from Allyson Sabal' Feeling Good book).  He agreed that he needs to change the way he thinks about himself.  Plan to meet in 3 weeks.

## 2012-11-20 LAB — CBC WITH DIFFERENTIAL/PLATELET
Basophils Absolute: 0 10*3/uL (ref 0.0–0.1)
Basophils Relative: 0 % (ref 0–1)
Eosinophils Absolute: 0.4 10*3/uL (ref 0.0–0.7)
MCH: 30.4 pg (ref 26.0–34.0)
MCHC: 34.5 g/dL (ref 30.0–36.0)
MCV: 88 fL (ref 78.0–100.0)
Neutrophils Relative %: 54 % (ref 43–77)
Platelets: 189 10*3/uL (ref 150–400)
RDW: 13.6 % (ref 11.5–15.5)

## 2012-11-20 LAB — HIV-1 RNA QUANT-NO REFLEX-BLD: HIV 1 RNA Quant: <20 copies/mL (ref <20)

## 2012-11-20 LAB — T-HELPER CELL (CD4) - (RCID CLINIC ONLY): CD4 T Cell Abs: 810 /uL (ref 400–2700)

## 2012-11-27 ENCOUNTER — Ambulatory Visit: Payer: No Typology Code available for payment source | Admitting: Infectious Diseases

## 2012-11-30 ENCOUNTER — Other Ambulatory Visit: Payer: Self-pay

## 2012-12-10 ENCOUNTER — Ambulatory Visit: Payer: Self-pay

## 2012-12-10 DIAGNOSIS — F330 Major depressive disorder, recurrent, mild: Secondary | ICD-10-CM
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
## 2012-12-14 ENCOUNTER — Ambulatory Visit: Payer: Self-pay | Admitting: Internal Medicine

## 2012-12-15 ENCOUNTER — Other Ambulatory Visit: Payer: Self-pay | Admitting: Licensed Clinical Social Worker

## 2012-12-15 DIAGNOSIS — E291 Testicular hypofunction: Secondary | ICD-10-CM

## 2012-12-15 MED ORDER — TESTOSTERONE 50 MG/5GM (1%) TD GEL: 5.0000 g | TRANSDERMAL | Status: DC

## 2012-12-24 ENCOUNTER — Ambulatory Visit: Payer: Self-pay

## 2012-12-24 DIAGNOSIS — F4320 Adjustment disorder, unspecified: Secondary | ICD-10-CM

## 2012-12-24 NOTE — Progress Notes (Signed)
Garion talked about his frustrations with his roommate, who was talking of moving out, but has broken off a relationship with the man he was going to move in with.  He talked about how he feels like he will never get ahead in his life until he can get his roommate out of his life, but he feels sorry for him and doesn't want to "put him on the street".  I pointed out that he may be doing him a favor by asking him to leave, forcing him to take care of himself and move on.  Sargent also talked about his physical health and concerns he has.  He recently started taking a supplement for his prostate.  I encouraged him to talk this over with his doctor next week.  Plan to meet in one week.

## 2012-12-29 ENCOUNTER — Encounter: Payer: Self-pay | Admitting: Infectious Diseases

## 2012-12-29 ENCOUNTER — Ambulatory Visit (INDEPENDENT_AMBULATORY_CARE_PROVIDER_SITE_OTHER): Payer: Self-pay | Admitting: Infectious Diseases

## 2012-12-29 VITALS — BP 131/87 | HR 74 | Temp 98.0°F | Ht 68.25 in | Wt 189.0 lb

## 2012-12-29 DIAGNOSIS — K644 Residual hemorrhoidal skin tags: Secondary | ICD-10-CM

## 2012-12-29 DIAGNOSIS — B20 Human immunodeficiency virus [HIV] disease: Secondary | ICD-10-CM

## 2012-12-29 DIAGNOSIS — E291 Testicular hypofunction: Secondary | ICD-10-CM

## 2012-12-29 DIAGNOSIS — G4700 Insomnia, unspecified: Secondary | ICD-10-CM

## 2012-12-29 LAB — CBC
MCH: 30.3 pg (ref 26.0–34.0)
MCHC: 34.2 g/dL (ref 30.0–36.0)
Platelets: 180 10*3/uL (ref 150–400)
RDW: 12.6 % (ref 11.5–15.5)

## 2012-12-29 MED ORDER — SILDENAFIL CITRATE 25 MG PO TABS: 25.0000 mg | ORAL_TABLET | Freq: Every day | ORAL | Status: DC | PRN

## 2012-12-29 MED ORDER — TEMAZEPAM 15 MG PO CAPS: 15.0000 mg | ORAL_CAPSULE | Freq: Every evening | ORAL | Status: DC | PRN

## 2012-12-29 NOTE — Assessment & Plan Note (Addendum)
Offered to switch him from stribilid, [place] caused bad dreams, complera would not work with his GERD. Could try triumeq after he gets HLA. Offered flu shot/refuses, offered condoms (partner is +). rtc in 1 month.

## 2012-12-29 NOTE — Assessment & Plan Note (Signed)
Is on testosterone. Has AM erections. Will give him trial of viagra.

## 2012-12-29 NOTE — Assessment & Plan Note (Signed)
Will have him seen by GI for hemmeroids as well as for GERD.

## 2012-12-29 NOTE — Progress Notes (Signed)
  Subjective:    Patient ID: Albert Brown, male    DOB: 12/12/1955, 57 y.o.   MRN: [id number]  HPI 57 yo M with HIV since 2003. Taking stribilid. "No problems so far." Was started on July 26 2012. Was prev on atripla, changed due to abn dreams. - has been having rectal bleeding for the last 5 days. Not just drops. Fresh blood with wiping. Has dripped blood after getting up from toilet. No blood clots or melena. No dizziness. Did have 1 episode of dizziness at work last week while staring at computer screen.  - been having severe GERD. Has taken protonix but not helpful. Has stinging pain in his throat. Has changed his diet- no high acid fruits.  - wt gain- up to 189# on stribilid. Feels like he is constantly eating. Quit taking for 2 weeks due to wt gain, GERD, gas-y. Fatigue was better, GERD better.  - not sleeping well, falls asleep easily, difficulty staying asleep.  - has been taking beta prostate to help him with polyuria. No dysuria, no cloudiness.   HIV 1 RNA Quant (copies/mL)  Date Value  11/19/2012 <20   09/07/2012 <20   05/27/2012 36*     CD4 T Cell Abs (/uL)  Date Value  11/19/2012 810   09/07/2012 720   05/27/2012 610    Review of Systems See HPI Decreased libido (since on stribilid)    Objective:   Physical Exam  Constitutional: He appears well-developed and well-nourished.  HENT:  Mouth/Throat: No oropharyngeal exudate.  Eyes: EOM are normal. Pupils are equal, round, and reactive to light.  Neck: Neck supple.  Cardiovascular: Normal rate, regular rhythm and normal heart sounds.   Pulmonary/Chest: Effort normal and breath sounds normal.  Abdominal: Soft. Bowel sounds are normal. He exhibits no distension. There is no tenderness.  Genitourinary: Rectal exam shows external hemorrhoid.  Lymphadenopathy:    He has no cervical adenopathy.          Assessment & Plan:

## 2012-12-30 LAB — HEPATITIS B SURFACE ANTIBODY,QUALITATIVE: Hep B S Ab: POSITIVE — AB

## 2012-12-31 ENCOUNTER — Ambulatory Visit: Payer: Self-pay

## 2012-12-31 ENCOUNTER — Other Ambulatory Visit: Payer: Self-pay | Admitting: *Deleted

## 2012-12-31 ENCOUNTER — Other Ambulatory Visit: Payer: Self-pay

## 2012-12-31 DIAGNOSIS — F4320 Adjustment disorder, unspecified: Secondary | ICD-10-CM

## 2012-12-31 DIAGNOSIS — E291 Testicular hypofunction: Secondary | ICD-10-CM

## 2012-12-31 MED ORDER — SILDENAFIL CITRATE 25 MG PO TABS: 25.0000 mg | ORAL_TABLET | Freq: Every day | ORAL | Status: DC | PRN
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
## 2013-01-07 ENCOUNTER — Ambulatory Visit: Payer: Self-pay

## 2013-01-07 DIAGNOSIS — F4320 Adjustment disorder, unspecified: Secondary | ICD-10-CM

## 2013-01-07 NOTE — Progress Notes (Signed)
Albert Brown was pleasant today, but wanted to talk about his life journey - leaving home when he was 17, coming out to his family in his early 69s, etc.  He said his roommate has been gone for the past week, which has been a nice break for him.  He also struggles with being too hard on himself and wants to learn how to stop this.  Plan to meet in one week.

## 2013-01-13 ENCOUNTER — Other Ambulatory Visit: Payer: Self-pay | Admitting: *Deleted

## 2013-01-13 DIAGNOSIS — K644 Residual hemorrhoidal skin tags: Secondary | ICD-10-CM

## 2013-01-18 ENCOUNTER — Ambulatory Visit (INDEPENDENT_AMBULATORY_CARE_PROVIDER_SITE_OTHER): Payer: Self-pay | Admitting: Infectious Diseases

## 2013-01-18 ENCOUNTER — Encounter: Payer: Self-pay | Admitting: Infectious Diseases

## 2013-01-18 VITALS — BP 138/89 | HR 71 | Temp 97.5°F | Wt 187.0 lb

## 2013-01-18 DIAGNOSIS — B20 Human immunodeficiency virus [HIV] disease: Secondary | ICD-10-CM

## 2013-01-18 DIAGNOSIS — N529 Male erectile dysfunction, unspecified: Secondary | ICD-10-CM

## 2013-01-18 MED ORDER — TADALAFIL 5 MG PO TABS: 5.0000 mg | ORAL_TABLET | Freq: Every day | ORAL | Status: DC | PRN

## 2013-01-18 MED ORDER — ABACAVIR-DOLUTEGRAVIR-LAMIVUD 600-50-300 MG PO TABS: 1.0000 | ORAL_TABLET | Freq: Every day | ORAL | Status: DC

## 2013-01-18 NOTE — Assessment & Plan Note (Signed)
Will give him trial of cialis.

## 2013-01-18 NOTE — Progress Notes (Signed)
  Subjective:    Patient ID: Albert Brown, male    DOB: 02-Mar-1955, 57 y.o.   MRN: [id number]  HPI 57 yo M with HIV since 2003. Taking stribilid. "No problems so far." Was started on July 26 2012. Was prev on atripla, changed due to abn dreams.  HIV 1 RNA Quant (copies/mL)  Date Value  11/19/2012 <20   09/07/2012 <20   05/27/2012 36*     CD4 T Cell Abs (/uL)  Date Value  11/19/2012 810   09/07/2012 720   05/27/2012 610    Has not been happy with stribild. Gained wt, attributes erectile dysfunction. Would like to try cialis, did not feel viagra worked very well.   Review of Systems     Objective:   Physical Exam  Constitutional: He appears well-developed and well-nourished.          Assessment & Plan:

## 2013-01-18 NOTE — Assessment & Plan Note (Addendum)
Will try him on triumeq. He is worried that he is running out of options, i explained that though he has changed medicine 3 times now, it has only been due to side effects, not resistance. He has not lost any options. He is pleased that his CD4 has gone up, but not happy that he has gained wt. I tried to explain to him that these are on the same continuum.

## 2013-01-28 ENCOUNTER — Ambulatory Visit: Payer: Self-pay

## 2013-01-28 DIAGNOSIS — F4320 Adjustment disorder, unspecified: Secondary | ICD-10-CM
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
## 2013-02-04 ENCOUNTER — Ambulatory Visit: Payer: Self-pay

## 2013-02-04 DIAGNOSIS — F330 Major depressive disorder, recurrent, mild: Secondary | ICD-10-CM

## 2013-02-04 NOTE — Progress Notes (Signed)
Albert Brown reported today that he has been more depressed lately.  He said that part of it is loneliness, though he is glad his ex-boyfriend/roommate has moved to another town.  He also is dreading the upcoming holiday and doesn't want to go to his brother's house for Christmas, in part due to his sister in law, who he describes as a "negative person" who puts down his brother.  We discussed ways for him to increase his socialization and I gave him the "homework" of calling [place] to discuss options for him going there to meet people.  Plan to meet again in one week.

## 2013-02-11 ENCOUNTER — Ambulatory Visit: Payer: Self-pay

## 2013-02-11 DIAGNOSIS — F4320 Adjustment disorder, unspecified: Secondary | ICD-10-CM

## 2013-02-11 NOTE — Progress Notes (Signed)
Albert Brown was in good spirits today and was very flexible when I had to delay our appointment 25 minutes to see a client in crisis.  He reported that he has been waking up some during the night, but has not been having as many problems with strange dreams, like he did when he took Atripla and Stribild.  No major concerns today.  Plan to meet again next week.

## 2013-02-16 ENCOUNTER — Ambulatory Visit: Payer: Self-pay

## 2013-02-16 DIAGNOSIS — F330 Major depressive disorder, recurrent, mild: Secondary | ICD-10-CM

## 2013-02-16 NOTE — Progress Notes (Signed)
Albert Brown was somewhat down today, saying that the holidays get him depressed.  He talked about his family asking him to come over and how he really doesn't want to.  His brother called him and invited him, but he told his brother "we don't do anything all year, so why do it now?"  He said he will likely go out of guilt.  I gave him some pointers on letting go of negative thoughts and suggested trying to go a day without judging anyone for anything.  He said it would be hard to do that, but would give it a try.  As the session progressed, his mood seemed to lift some.  Plan to meet in 2 weeks.

## 2013-03-03 ENCOUNTER — Ambulatory Visit: Payer: Self-pay | Admitting: Infectious Diseases

## 2013-03-04 ENCOUNTER — Ambulatory Visit: Payer: No Typology Code available for payment source

## 2013-03-04 DIAGNOSIS — F330 Major depressive disorder, recurrent, mild: Secondary | ICD-10-CM

## 2013-03-04 NOTE — Progress Notes (Signed)
Albert Brown reported that he had a good visit with his brother and family on Christmas, despite his dreading it.  He said he was glad he went after all.  He continues to stress about his healthcare and worries that he is going to have to pay for ACA coverage, which he says he cannot afford.  He says he is very resentful of having to pay for it.  He continues to report feeling lonely at times, but hasn't met anyone or even tried to.  Plan to meet again in 2 weeks.

## 2013-03-08 ENCOUNTER — Other Ambulatory Visit: Payer: Self-pay | Admitting: *Deleted

## 2013-03-08 DIAGNOSIS — B20 Human immunodeficiency virus [HIV] disease: Secondary | ICD-10-CM

## 2013-03-08 MED ORDER — ABACAVIR-DOLUTEGRAVIR-LAMIVUD 600-50-300 MG PO TABS: 1.0000 | ORAL_TABLET | Freq: Every day | ORAL | Status: DC

## 2013-03-08 NOTE — Telephone Encounter (Signed)
ADAP Application 

## 2013-03-09 ENCOUNTER — Encounter: Payer: Self-pay | Admitting: Infectious Diseases

## 2013-03-09 ENCOUNTER — Ambulatory Visit (INDEPENDENT_AMBULATORY_CARE_PROVIDER_SITE_OTHER): Payer: No Typology Code available for payment source | Admitting: Infectious Diseases

## 2013-03-09 VITALS — BP 147/92 | HR 74 | Temp 98.3°F | Ht 68.0 in | Wt 191.0 lb

## 2013-03-09 DIAGNOSIS — Z113 Encounter for screening for infections with a predominantly sexual mode of transmission: Secondary | ICD-10-CM

## 2013-03-09 DIAGNOSIS — Z79899 Other long term (current) drug therapy: Secondary | ICD-10-CM

## 2013-03-09 DIAGNOSIS — K219 Gastro-esophageal reflux disease without esophagitis: Secondary | ICD-10-CM

## 2013-03-09 DIAGNOSIS — R079 Chest pain, unspecified: Secondary | ICD-10-CM | POA: Insufficient documentation

## 2013-03-09 DIAGNOSIS — B20 Human immunodeficiency virus [HIV] disease: Secondary | ICD-10-CM

## 2013-03-09 LAB — COMPREHENSIVE METABOLIC PANEL
ALK PHOS: 68 U/L (ref 39–117)
ALT: 37 U/L (ref 0–53)
AST: 20 U/L (ref 0–37)
Albumin: 4.5 g/dL (ref 3.5–5.2)
BILIRUBIN TOTAL: 0.3 mg/dL (ref 0.3–1.2)
BUN: 23 mg/dL (ref 6–23)
CO2: 28 mEq/L (ref 19–32)
Calcium: 9.7 mg/dL (ref 8.4–10.5)
Chloride: 106 mEq/L (ref 96–112)
Creat: 1.27 mg/dL (ref 0.50–1.35)
Glucose, Bld: 82 mg/dL (ref 70–99)
Potassium: 5 mEq/L (ref 3.5–5.3)
Sodium: 140 mEq/L (ref 135–145)
Total Protein: 7.2 g/dL (ref 6.0–8.3)

## 2013-03-09 LAB — LIPID PANEL
CHOL/HDL RATIO: 4 ratio
CHOLESTEROL: 177 mg/dL (ref 0–200)
HDL: 44 mg/dL (ref >39)
LDL Cholesterol: 108 mg/dL — ABNORMAL HIGH (ref 0–99)
Triglycerides: 125 mg/dL (ref <150)
VLDL: 25 mg/dL (ref 0–40)

## 2013-03-09 LAB — CBC
HEMATOCRIT: 45 % (ref 39.0–52.0)
HEMOGLOBIN: 15.4 g/dL (ref 13.0–17.0)
MCH: 31 pg (ref 26.0–34.0)
MCHC: 34.2 g/dL (ref 30.0–36.0)
MCV: 90.5 fL (ref 78.0–100.0)
Platelets: 177 10*3/uL (ref 150–400)
RBC: 4.97 MIL/uL (ref 4.22–5.81)
RDW: 14.7 % (ref 11.5–15.5)
WBC: 6.2 10*3/uL (ref 4.0–10.5)

## 2013-03-09 NOTE — Progress Notes (Signed)
   Subjective:    Patient ID: Albert Brown, male    DOB: June 08, 1955, 58 y.o.   MRN: [id number]  HPI 58 yo M with HIV since 2003. Taking stribilid. "No problems so far." Was started on July 26 2012. Was prev on atripla, changed due to abn dreams. Was seen November 2014 and changed to triumeq due to wt gain. Has had euphoria with this and "ill feeling". Changed to taking at [facility] which caused him to feel hung over. He is now taking with dinner.  Having pain across upper abd. Feels twinge with deep breathing. Hasn't noticed change with eating. Also has feeling of soreness in his neck and throat.  Has been referred to GI but has not been able to afford to go.  No further rectal bleeding. Has had gas.   HIV 1 RNA Quant (copies/mL)  Date Value  11/19/2012 <20   09/07/2012 <20   05/27/2012 36*     CD4 T Cell Abs (/uL)  Date Value  11/19/2012 810   09/07/2012 720   05/27/2012 610    Review of Systems  Constitutional: Negative for appetite change and unexpected weight change.  Gastrointestinal: Positive for abdominal pain. Negative for diarrhea, constipation, blood in stool and anal bleeding.  Genitourinary: Negative for difficulty urinating.  wt is up to 191.      Objective:   Physical Exam  Constitutional: He appears well-developed and well-nourished.  HENT:  Mouth/Throat: No oropharyngeal exudate.  Eyes: EOM are normal. Pupils are equal, round, and reactive to light.  Neck: Neck supple.  Cardiovascular: Normal rate, regular rhythm and normal heart sounds.   Pulmonary/Chest: Effort normal and breath sounds normal.  Abdominal: Soft. Bowel sounds are normal. There is no tenderness. There is no rebound.  Lymphadenopathy:    He has no cervical adenopathy.          Assessment & Plan:

## 2013-03-09 NOTE — Assessment & Plan Note (Signed)
Will try to get him orange card so he can GI eval/endoscopy.

## 2013-03-09 NOTE — Assessment & Plan Note (Signed)
Seems more like subxyphoid-epigastric pain. He also complains of easy fatigue. Will send him for gxt.

## 2013-03-09 NOTE — Assessment & Plan Note (Signed)
Will send him for labs today. He is doing well. Offered/refused condoms. See him back in 6 months.

## 2013-03-09 NOTE — Addendum Note (Signed)
Addended by: Dolan Amen D on: 03/09/2013 05:27 PM   Modules accepted: Orders

## 2013-03-10 ENCOUNTER — Telehealth: Payer: Self-pay | Admitting: *Deleted

## 2013-03-10 LAB — RPR

## 2013-03-10 NOTE — Telephone Encounter (Signed)
Called patient and left him a voice mail to return my call. He has an appt with [facility] for 03/16/13 at 8:30 AM with Dr. Tamala Julian. Myrtis Hopping

## 2013-03-11 LAB — HIV-1 RNA QUANT-NO REFLEX-BLD: HIV 1 RNA Quant: <20 copies/mL (ref <20)

## 2013-03-12 LAB — T-HELPER CELL (CD4) - (RCID CLINIC ONLY)
CD4 % Helper T Cell: 34 % (ref 33–55)
CD4 T Cell Abs: 770 /uL (ref 400–2700)

## 2013-03-16 ENCOUNTER — Institutional Professional Consult (permissible substitution): Payer: Self-pay | Admitting: Interventional Cardiology

## 2013-03-18 ENCOUNTER — Ambulatory Visit: Payer: No Typology Code available for payment source

## 2013-03-18 DIAGNOSIS — F330 Major depressive disorder, recurrent, mild: Secondary | ICD-10-CM
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
## 2013-03-22 ENCOUNTER — Telehealth: Payer: Self-pay | Admitting: *Deleted

## 2013-03-22 NOTE — Telephone Encounter (Signed)
Enrolled Albert Brown with [employer].  His Insurance # will be [id number] with [place].

## 2013-03-25 ENCOUNTER — Ambulatory Visit: Payer: No Typology Code available for payment source

## 2013-03-25 DIAGNOSIS — F4320 Adjustment disorder, unspecified: Secondary | ICD-10-CM

## 2013-03-25 NOTE — Progress Notes (Signed)
Albert Brown was in a pleasant mood today and talked about how much music is a part of his life.  He said he has a big music collection that includes CD's, records, and tapes.  He then talked some about his ex-partner/roommate, [name], who has moved out of town.  He feels like he can finally move on, but doesn't know where to find someone.  He said he [place] a guy [place], whom he was attracted to, but he said he wasn't someone he would want a relationship with.  He said he is starting to realize that since he is undetectable, he can allow himself to have safe sex, whereas, for many years he has avoided it altogether.  Plan to meet again in one week.

## 2013-04-01 ENCOUNTER — Ambulatory Visit: Payer: No Typology Code available for payment source

## 2013-04-01 DIAGNOSIS — F4320 Adjustment disorder, unspecified: Secondary | ICD-10-CM

## 2013-04-01 NOTE — Progress Notes (Signed)
Albert Brown was in a pleasant mood today and began talking about some of his past - particularly when his parents got a divorce.  He was 11-12 at the time and just discovering he was gay.  In addition, he said, he was diagnosed with hyperthyroidism around this time, adding to the stress of being an adolescent.  Eventually, he said, his parents became good friends and he would go visit his father, who lived a few blocks away.  Otherwise, he continues to be frustrated with how isolated he is socially.  I reminded him that he was going to call Elta Guadeloupe at [employer] and he agreed to do this.  He also talked about church and we researched online for churches that are accepting and supportive of LGBT.  Plan to meet again in one week.

## 2013-04-12 ENCOUNTER — Other Ambulatory Visit: Payer: Self-pay | Admitting: *Deleted

## 2013-04-12 ENCOUNTER — Institutional Professional Consult (permissible substitution): Payer: Self-pay | Admitting: Interventional Cardiology

## 2013-04-12 DIAGNOSIS — B20 Human immunodeficiency virus [HIV] disease: Secondary | ICD-10-CM

## 2013-04-12 MED ORDER — ABACAVIR-DOLUTEGRAVIR-LAMIVUD 600-50-300 MG PO TABS: 1.0000 | ORAL_TABLET | Freq: Every day | ORAL | Status: DC

## 2013-04-15 ENCOUNTER — Ambulatory Visit: Payer: No Typology Code available for payment source

## 2013-04-15 DIAGNOSIS — F4320 Adjustment disorder, unspecified: Secondary | ICD-10-CM

## 2013-04-15 NOTE — Progress Notes (Signed)
Thales was pleasant again today, reporting that things are going along fairly well.  He did complain that he has problems with obsessing - especially getting a song stuck in his head, but also ruminating over situations with friends or acquaintances and playing out scenarios in his head over and over.  I reminded him of a prior session when we discussed mindfulness and he said he remembered.  I reviewed some of the basics and gave him an article from [employer] about mindfulness meditation.  He also has been asked about moving in with a friend, which would help him financially, but he hates to give up the freedom he has living alone.  Plan to meet again in one week.

## 2013-04-22 ENCOUNTER — Ambulatory Visit: Payer: Self-pay

## 2013-04-24 ENCOUNTER — Encounter: Payer: Self-pay | Admitting: *Deleted

## 2013-04-24 ENCOUNTER — Encounter: Payer: Self-pay | Admitting: Interventional Cardiology

## 2013-04-28 ENCOUNTER — Encounter: Payer: Self-pay | Admitting: Interventional Cardiology

## 2013-04-28 ENCOUNTER — Ambulatory Visit (INDEPENDENT_AMBULATORY_CARE_PROVIDER_SITE_OTHER): Payer: No Typology Code available for payment source | Admitting: Interventional Cardiology

## 2013-04-28 VITALS — BP 152/103 | HR 75 | Ht 68.0 in | Wt 190.0 lb

## 2013-04-28 DIAGNOSIS — R0609 Other forms of dyspnea: Secondary | ICD-10-CM

## 2013-04-28 DIAGNOSIS — B20 Human immunodeficiency virus [HIV] disease: Secondary | ICD-10-CM

## 2013-04-28 DIAGNOSIS — R079 Chest pain, unspecified: Secondary | ICD-10-CM

## 2013-04-28 DIAGNOSIS — R0600 Dyspnea, unspecified: Secondary | ICD-10-CM

## 2013-04-28 DIAGNOSIS — R0989 Other specified symptoms and signs involving the circulatory and respiratory systems: Secondary | ICD-10-CM

## 2013-04-28 DIAGNOSIS — K219 Gastro-esophageal reflux disease without esophagitis: Secondary | ICD-10-CM

## 2013-04-28 NOTE — Progress Notes (Signed)
Patient ID: Albert Brown, male   DOB: 11-19-1955, 58 y.o.   MRN: [id number]   Date: 04/28/2013 ID: Albert Brown, DOB Sep 14, 1955, MRN [id number] PCP: No primary provider on file.  Reason: Exertional dyspnea chest pain, 2 month duration  ASSESSMENT;  1. Exertional dyspnea 2. Chest discomfort, characterizes tightness 3. Chronic HIV infection  PLAN:  1. 2-D Doppler echocardiogram to rule out diastolic dysfunction/systolic dysfunction/pericardial disease in this patient with known HIV 2. Exercise treadmill test rule out CAD in this patient is a prior moderate smoker   SUBJECTIVE: Albert Brown is a 58 y.o. male who is 58 years old and has a history of HIV since 2003. He is healthy. His counts are excellent. He is on a stable medical regimen for HIV control. Over the past 2-4 months he is noted intermittent episodes of tightness in the chest. These episodes are precordial. Did not particularly exertional. At times the discomfort can last hours to days. Additionally, he is noted exertional dyspnea with walking fast and jogging. This is distinctly different than what he remembers from several years ago when he exercises on a regular basis. When he runs he feels as lowest heart rate increases much faster and sustains other fast rate longer than he has ever previously noted. He denies orthopnea, PND, and lower extremity swelling to   Allergies  Allergen Reactions  . Bactrim [Sulfamethoxazole-Tmp Ds]     "Pt stated he died when taking this"  . Ciprofloxacin     REACTION: rash  . Codeine   . Dapsone     REACTION: severe-anaphylaxis  . Sulfamethoxazole-Trimethoprim     Anaphylaxis   . Sulfonamide Derivatives     Current Outpatient Prescriptions on File Prior to Visit  Medication Sig Dispense Refill  . Abacavir-Dolutegravir-Lamivud 600-50-300 MG TABS Take 1 tablet by mouth daily.  30 tablet  6  . tadalafil (CIALIS) 5 MG tablet Take 1 tablet (5 mg total) by mouth daily as needed for  erectile dysfunction.  10 tablet  0  . temazepam (RESTORIL) 15 MG capsule Take 1 capsule (15 mg total) by mouth at bedtime as needed for sleep.  30 capsule  0  . testosterone (ANDROGEL) 50 MG/5GM GEL Place 5 g onto the skin 3 (three) times a week. 1%  30 Package  5   No current facility-administered medications on file prior to visit.    Past Medical History  Diagnosis Date  . HIV infection   . EXTERNAL HEMORRHOIDS WITHOUT MENTION COMP   . ALLERGIC RHINITIS   . Allergic sinusitis   . Anxiety state, unspecified   . Cerumen impaction, right   . Chest pain, unspecified   . DEPRESSION   . Diarrhea   . Erectile dysfunction   . GERD   . HIV DISEASE   . HYPOGONADISM   . Periodontal disease   . PERIPHERAL NEUROPATHY   . PHARYNGITIS, RECURRENT   . Pterygium of left eye   . Urinary frequency   . VERTIGO     No past surgical history on file.  History   Social History  . Marital Status: Single    Spouse Name: N/A    Number of Children: N/A  . Years of Education: N/A   Occupational History  . Not on file.   Social History Main Topics  . Smoking status: Former Smoker    Types: Cigarettes    Quit date: 03/02/2007  . Smokeless tobacco: Never Used     Comment: pt. no longer  smokes  . Alcohol Use: Yes     Comment: weekends  . Drug Use: No  . Sexual Activity: No     Comment: pt. refused condoms   Other Topics Concern  . Not on file   Social History Narrative  . No narrative on file    Family History  Problem Relation Age of Onset  . Prostate cancer Father   . Prostate cancer Brother   . Prostate cancer Brother   . Cancer Brother   . Osteosarcoma Father   . Cancer Father     prostate  . Cancer Mother     brain tumor    ROS: No rash, chills, fever, syncope, prolonged palpitations, claudication, abdominal pain, nausea, vomiting. Other systems negative for complaints.  OBJECTIVE: BP 152/103  Pulse 75  Ht 5\' 8"  (1.727 m)  Wt 190 lb (86.183 kg)  BMI 28.90  kg/m2,  General: No acute distress, appearing younger than his stated age 68: normal  Neck: JVD flat. Carotids absent Chest: Clear Cardiac: Murmur: None. Gallop: Absent. Rhythm: Normal. Other: Normal Abdomen: Bruit: Absent. Pulsation: Absent Extremities: Edema: Absent. Pulses: 2+ bilateral Neuro: Normal Psych: Normal  ECG: Normal

## 2013-04-28 NOTE — Patient Instructions (Signed)
Your physician recommends that you continue on your current medications as directed. Please refer to the Current Medication list given to you today.  Your physician has requested that you have an echocardiogram. Echocardiography is a painless test that uses sound waves to create images of your heart. It provides your doctor with information about the size and shape of your heart and how well your heart's chambers and valves are working. This procedure takes approximately one hour. There are no restrictions for this procedure.   Your physician has requested that you have an exercise tolerance test. For further information please visit [email or web address]. Please also follow instruction sheet, as given.   Your physician recommends that you schedule a follow-up appointment in: pending results of echo and exercise tolerance test

## 2013-04-29 ENCOUNTER — Ambulatory Visit: Payer: Self-pay

## 2013-05-06 ENCOUNTER — Ambulatory Visit: Payer: No Typology Code available for payment source

## 2013-05-06 DIAGNOSIS — F330 Major depressive disorder, recurrent, mild: Secondary | ICD-10-CM

## 2013-05-06 NOTE — Progress Notes (Signed)
Albert Brown reported that he is frustrated with his "boring" life.  He said he wants to change several things in his life, but doesn't know how to go about it - he needs to make more money and he wants to be in a good relationship.  He said there are things about his job that he is grateful for, such as the hours, the fact that he is respected there, etc.  We discussed some of his past relationships and how he met some of his previous partners.  He admits that he is his own worst enemy - playing it safe because of fear.  He still is able to laugh about it all at times.  Plan to meet in one week.

## 2013-05-12 ENCOUNTER — Encounter: Payer: Self-pay | Admitting: Nurse Practitioner

## 2013-05-13 ENCOUNTER — Ambulatory Visit: Payer: No Typology Code available for payment source

## 2013-05-13 DIAGNOSIS — F4320 Adjustment disorder, unspecified: Secondary | ICD-10-CM

## 2013-05-13 NOTE — Progress Notes (Signed)
Delia came in very upset initially, reporting that he recently went to a cardiologist to check out some chest pains and shortness of breath, was charged a $40 copay, but then got a bill for $324.  This, apparently, is because his insurance deductible  is $2500.  He expressed how angry he is that he is required by the [employer] to pay $77 for insurance that doesn't pay for what he needs.  He also reported he has been having a strange feeling in his stomach since last Saturday and he now worries about this, but feels he can't afford to have it looked into.  After he vented about this, he was able to calm down and smile and laugh some.  He talked about his anxiety re: finances and wishes he could find a roommate.  Plan to meet in one week.

## 2013-05-17 ENCOUNTER — Other Ambulatory Visit (HOSPITAL_COMMUNITY): Payer: No Typology Code available for payment source

## 2013-05-20 ENCOUNTER — Ambulatory Visit: Payer: No Typology Code available for payment source

## 2013-05-20 DIAGNOSIS — F4320 Adjustment disorder, unspecified: Secondary | ICD-10-CM

## 2013-05-20 NOTE — Progress Notes (Signed)
Albert Brown was pleasant today, but at the same time somewhat frustrated with the fact that his life is not where he wants it to be.  He reported that his ex-partner and ex-roommate, Albert Brown, has returned to live with him after being gone for 2-3 months.  Albert Brown has said before that he doesn't want to live with him, but he has a "soft heart" and can't put Albert Brown out on the street.  He talked about his long history of relationship problems and how he regrets breaking it off with 2 or 3 of his previous boyfriends, years ago.  He also said that because of Albert Brown's problems with honesty and trustworthiness, Albert Brown has lost any attraction he used to have for him.  I used cognitive behavioral techniques to help him look more positively at himself and avoid putting himself down.  Plan to meet in one week.

## 2013-05-27 ENCOUNTER — Ambulatory Visit: Payer: No Typology Code available for payment source

## 2013-05-27 DIAGNOSIS — F4320 Adjustment disorder, unspecified: Secondary | ICD-10-CM

## 2013-05-27 NOTE — Progress Notes (Signed)
Albert Brown was pleasant today, but reported that he is very frustrated with his job and is considering applying for a new job.  He has been with the same company for 13 years and admittedly hates changing jobs, but is annoyed because the company has been bought out and there are new changes in his responsibilities.  He also talked about his ex-boyfriend, who has moved back in with him, reporting that they seem to be getting along so far.  He tends to put himself down at times, saying he doesn't know how to get out of the rut of being single and not finding someone good to be in a relationship with.  Plan to meet in one week.

## 2013-05-28 ENCOUNTER — Encounter: Payer: No Typology Code available for payment source | Admitting: Nurse Practitioner

## 2013-06-03 ENCOUNTER — Ambulatory Visit: Payer: No Typology Code available for payment source

## 2013-06-03 DIAGNOSIS — F411 Generalized anxiety disorder: Secondary | ICD-10-CM
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
## 2013-06-10 ENCOUNTER — Ambulatory Visit: Payer: No Typology Code available for payment source

## 2013-06-10 DIAGNOSIS — F411 Generalized anxiety disorder: Secondary | ICD-10-CM
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
## 2013-06-17 ENCOUNTER — Ambulatory Visit: Payer: No Typology Code available for payment source

## 2013-06-21 ENCOUNTER — Telehealth: Payer: Self-pay | Admitting: *Deleted

## 2013-06-21 NOTE — Telephone Encounter (Signed)
Received a call from [employer].  He was wanting to know about his Triumeq.  He is having problems getting it through his insurance and said per [facility] it is not covered.  [facility] said they did not see any problem other than it is an internal thing and will find out if he can get it through his local pharmacy or not.  I told Albert Brown we should know by this afternoon and he will be informed of the outcome.

## 2013-07-01 ENCOUNTER — Ambulatory Visit: Payer: No Typology Code available for payment source

## 2013-07-05 ENCOUNTER — Other Ambulatory Visit: Payer: Self-pay | Admitting: Infectious Diseases

## 2013-07-05 DIAGNOSIS — R7989 Other specified abnormal findings of blood chemistry: Secondary | ICD-10-CM

## 2013-07-15 ENCOUNTER — Other Ambulatory Visit: Payer: Self-pay | Admitting: *Deleted

## 2013-07-15 ENCOUNTER — Ambulatory Visit: Payer: No Typology Code available for payment source

## 2013-07-15 DIAGNOSIS — N529 Male erectile dysfunction, unspecified: Secondary | ICD-10-CM

## 2013-07-15 DIAGNOSIS — F411 Generalized anxiety disorder: Secondary | ICD-10-CM

## 2013-07-15 MED ORDER — TADALAFIL 5 MG PO TABS: 5.0000 mg | ORAL_TABLET | Freq: Every day | ORAL | Status: DC | PRN

## 2013-07-15 NOTE — Progress Notes (Signed)
Albert Brown reports that he continues to struggle with some of the same issues he has been dealing with, such as not making enough money at his job, wanting to be in a relationship, etc.  He said he did get his car payment loan reduced by changing banks and this will save him some money each month.  He continues to worry about his health and wants to see a gastroenterologist but can't afford it.  He complained of being tired and having low energy.  I encouraged him to begin exercising and he agreed that it would help, but said he can't seem to get up and get going with it.  Plan to meet in one week.

## 2013-07-20 ENCOUNTER — Telehealth: Payer: Self-pay | Admitting: *Deleted

## 2013-07-20 NOTE — Telephone Encounter (Signed)
Ok to refill   thanks

## 2013-07-20 NOTE — Telephone Encounter (Signed)
Patient was given a trial of cialis 5 mg daily #04 January 2013 and would like a refill for #30 for daily use. He has found a coupon for 30 free. Please advise Albert Brown

## 2013-07-20 NOTE — Telephone Encounter (Signed)
Patient notified, Rx sent to pharmacy

## 2013-07-22 ENCOUNTER — Ambulatory Visit: Payer: No Typology Code available for payment source

## 2013-07-22 DIAGNOSIS — F4320 Adjustment disorder, unspecified: Secondary | ICD-10-CM

## 2013-07-22 NOTE — Progress Notes (Signed)
Koven reported that things have been going fairly well and he has met someone new who he is very interested in.  This brought up an issue for him, which he shared - that he has always felt he is not "worthy" of being with people who are successful.  He said he has given up several good relationships in his life because he didn't feel like he deserved them.  He doesn't know why, but says he has always had self esteem issues, despite growing up in a good, middle class family with good values and education.  We discussed this for a while and I said one has to tell oneself that if it is good enough for someone else it is good enough for you.  Plan to meet again next week.

## 2013-07-29 ENCOUNTER — Ambulatory Visit: Payer: No Typology Code available for payment source

## 2013-07-29 DIAGNOSIS — F4320 Adjustment disorder, unspecified: Secondary | ICD-10-CM

## 2013-07-29 NOTE — Progress Notes (Signed)
Albert Brown reported that he has had an uneventful week.  He talked about 2 men he has been somewhat involved with, one who is married (to a woman) and one who is gay and very wealthy.  He said he is more interested in the married man than the wealthy one.  I pointed out that maybe he is more interested because he is "unattainable".  He agreed that this may be the case.  He also talked about his HIV status and the fact that, even though he is undetectable, he is not comfortable having an orgasm inside his partner.  I encouraged him to talk with the doctor about the implications of this.  Plan to meet in one week.

## 2013-08-05 ENCOUNTER — Ambulatory Visit: Payer: No Typology Code available for payment source

## 2013-08-05 DIAGNOSIS — F330 Major depressive disorder, recurrent, mild: Secondary | ICD-10-CM
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
## 2013-08-06 ENCOUNTER — Telehealth: Payer: Self-pay | Admitting: Licensed Clinical Social Worker

## 2013-08-06 NOTE — Telephone Encounter (Signed)
Can you schedule him to come in for testosterone blood draws between 7-10 on consecutive days thanks

## 2013-08-06 NOTE — Telephone Encounter (Signed)
Received prior authorization request for Androgel, per patient 's insurance they will not approve the medication until patient has 2 consecutive testosterone labs drawn in the morning between 7 am and 10 am. Please advise

## 2013-08-07 ENCOUNTER — Inpatient Hospital Stay (HOSPITAL_COMMUNITY)
Admission: EM | Admit: 2013-08-07 | Discharge: 2013-08-10 | DRG: 247 | Disposition: A | Discharge status: Home / Self Care | Payer: No Typology Code available for payment source | Attending: Interventional Cardiology | Admitting: Interventional Cardiology

## 2013-08-07 ENCOUNTER — Encounter (HOSPITAL_COMMUNITY): Payer: Self-pay | Admitting: Emergency Medicine

## 2013-08-07 ENCOUNTER — Emergency Department (HOSPITAL_COMMUNITY): Payer: No Typology Code available for payment source

## 2013-08-07 DIAGNOSIS — IMO0002 Reserved for concepts with insufficient information to code with codable children: Secondary | ICD-10-CM | POA: Diagnosis present

## 2013-08-07 DIAGNOSIS — I498 Other specified cardiac arrhythmias: Secondary | ICD-10-CM | POA: Diagnosis present

## 2013-08-07 DIAGNOSIS — Z885 Allergy status to narcotic agent status: Secondary | ICD-10-CM

## 2013-08-07 DIAGNOSIS — Z87891 Personal history of nicotine dependence: Secondary | ICD-10-CM

## 2013-08-07 DIAGNOSIS — I214 Non-ST elevation (NSTEMI) myocardial infarction: Principal | ICD-10-CM | POA: Diagnosis present

## 2013-08-07 DIAGNOSIS — F329 Major depressive disorder, single episode, unspecified: Secondary | ICD-10-CM | POA: Diagnosis present

## 2013-08-07 DIAGNOSIS — I2582 Chronic total occlusion of coronary artery: Secondary | ICD-10-CM | POA: Diagnosis present

## 2013-08-07 DIAGNOSIS — Z21 Asymptomatic human immunodeficiency virus [HIV] infection status: Secondary | ICD-10-CM | POA: Diagnosis present

## 2013-08-07 DIAGNOSIS — T463X5A Adverse effect of coronary vasodilators, initial encounter: Secondary | ICD-10-CM | POA: Diagnosis present

## 2013-08-07 DIAGNOSIS — Z8042 Family history of malignant neoplasm of prostate: Secondary | ICD-10-CM

## 2013-08-07 DIAGNOSIS — Z808 Family history of malignant neoplasm of other organs or systems: Secondary | ICD-10-CM

## 2013-08-07 DIAGNOSIS — F411 Generalized anxiety disorder: Secondary | ICD-10-CM | POA: Diagnosis present

## 2013-08-07 DIAGNOSIS — E785 Hyperlipidemia, unspecified: Secondary | ICD-10-CM | POA: Diagnosis present

## 2013-08-07 DIAGNOSIS — Z881 Allergy status to other antibiotic agents status: Secondary | ICD-10-CM

## 2013-08-07 DIAGNOSIS — Z882 Allergy status to sulfonamides status: Secondary | ICD-10-CM

## 2013-08-07 DIAGNOSIS — I2510 Atherosclerotic heart disease of native coronary artery without angina pectoris: Secondary | ICD-10-CM

## 2013-08-07 DIAGNOSIS — Z955 Presence of coronary angioplasty implant and graft: Secondary | ICD-10-CM

## 2013-08-07 DIAGNOSIS — F3289 Other specified depressive episodes: Secondary | ICD-10-CM | POA: Diagnosis present

## 2013-08-07 DIAGNOSIS — I2589 Other forms of chronic ischemic heart disease: Secondary | ICD-10-CM | POA: Diagnosis present

## 2013-08-07 DIAGNOSIS — K219 Gastro-esophageal reflux disease without esophagitis: Secondary | ICD-10-CM | POA: Diagnosis present

## 2013-08-07 DIAGNOSIS — R079 Chest pain, unspecified: Secondary | ICD-10-CM

## 2013-08-07 DIAGNOSIS — Z9861 Coronary angioplasty status: Secondary | ICD-10-CM

## 2013-08-07 DIAGNOSIS — E291 Testicular hypofunction: Secondary | ICD-10-CM | POA: Diagnosis present

## 2013-08-07 DIAGNOSIS — I959 Hypotension, unspecified: Secondary | ICD-10-CM | POA: Diagnosis present

## 2013-08-07 LAB — BASIC METABOLIC PANEL
BUN: 21 mg/dL (ref 6–23)
CO2: 24 mEq/L (ref 19–32)
Calcium: 9.8 mg/dL (ref 8.4–10.5)
Chloride: 104 mEq/L (ref 96–112)
Creatinine, Ser: 1.19 mg/dL (ref 0.50–1.35)
GFR calc Af Amer: 78 mL/min — ABNORMAL LOW (ref >90)
GFR calc non Af Amer: 67 mL/min — ABNORMAL LOW (ref >90)
GLUCOSE: 130 mg/dL — AB (ref 70–99)
POTASSIUM: 4.2 meq/L (ref 3.7–5.3)
Sodium: 142 mEq/L (ref 137–147)

## 2013-08-07 LAB — I-STAT TROPONIN, ED: Troponin i, poc: 0.75 ng/mL (ref 0.00–0.08)

## 2013-08-07 LAB — CBC
HEMATOCRIT: 45.2 % (ref 39.0–52.0)
Hemoglobin: 15.2 g/dL (ref 13.0–17.0)
MCH: 32.3 pg (ref 26.0–34.0)
MCHC: 33.6 g/dL (ref 30.0–36.0)
MCV: 96 fL (ref 78.0–100.0)
Platelets: 179 10*3/uL (ref 150–400)
RBC: 4.71 MIL/uL (ref 4.22–5.81)
RDW: 12.9 % (ref 11.5–15.5)
WBC: 10.7 10*3/uL — ABNORMAL HIGH (ref 4.0–10.5)

## 2013-08-07 LAB — PRO B NATRIURETIC PEPTIDE: Pro B Natriuretic peptide (BNP): 54.8 pg/mL (ref 0–125)

## 2013-08-07 MED ORDER — FENTANYL CITRATE 0.05 MG/ML IJ SOLN
50.0000 ug | Freq: Once | INTRAMUSCULAR | Status: AC
  Administered 2013-08-07: 50 ug via INTRAVENOUS
  Filled 2013-08-07: qty 2

## 2013-08-07 MED ORDER — HEPARIN SODIUM (PORCINE) 5000 UNIT/ML IJ SOLN: 4000.0000 [IU] | Freq: Once | INTRAMUSCULAR | Status: DC

## 2013-08-07 MED ORDER — HEPARIN (PORCINE) IN NACL 100-0.45 UNIT/ML-% IJ SOLN
900.0000 [IU]/h | INTRAMUSCULAR | Status: DC
  Administered 2013-08-07: 900 [IU]/h via INTRAVENOUS
  Filled 2013-08-07 (×2): qty 250

## 2013-08-07 MED ORDER — SODIUM CHLORIDE 0.9 % IV BOLUS (SEPSIS)
1000.0000 mL | Freq: Once | INTRAVENOUS | Status: AC
  Administered 2013-08-07: 1000 mL via INTRAVENOUS

## 2013-08-07 MED ORDER — ONDANSETRON HCL 4 MG/2ML IJ SOLN
4.0000 mg | Freq: Once | INTRAMUSCULAR | Status: AC
  Administered 2013-08-07: 4 mg via INTRAVENOUS
  Filled 2013-08-07: qty 2

## 2013-08-07 MED ORDER — CLOPIDOGREL BISULFATE 300 MG PO TABS
300.0000 mg | ORAL_TABLET | Freq: Once | ORAL | Status: AC
  Administered 2013-08-07: 300 mg via ORAL
  Filled 2013-08-07: qty 1

## 2013-08-07 MED ORDER — HEPARIN BOLUS VIA INFUSION
4000.0000 [IU] | Freq: Once | INTRAVENOUS | Status: AC
  Administered 2013-08-07: 4000 [IU] via INTRAVENOUS
  Filled 2013-08-07: qty 4000

## 2013-08-07 MED ORDER — NITROGLYCERIN 0.4 MG SL SUBL
0.4000 mg | SUBLINGUAL_TABLET | SUBLINGUAL | Status: DC | PRN
  Administered 2013-08-07 (×2): 0.4 mg via SUBLINGUAL
  Filled 2013-08-07: qty 1

## 2013-08-07 NOTE — ED Notes (Signed)
Family at bedside. 

## 2013-08-07 NOTE — ED Notes (Signed)
Pt reports mid chest pain that radiates up to his throat and bila arms while hiking [place] this afternoon.  Pt reports no relief when he stopped hiking.  States he drank some gatorade and took one dose of ASA-without relief.  Pt  Reports he went home and took a shower but pain still did not get any better.  At present, pt reports pain is also radiating to his back.  Pt reports nausea as well.

## 2013-08-07 NOTE — ED Notes (Signed)
Pt presents from home with with generalized CP onset this after noon 1400-1500 sharp, with arm weakness while hiking today. +nausea, + diaphoresis. Pt did take regular ASA @ 1600. Pt did take a Cialis yesterday.

## 2013-08-07 NOTE — ED Notes (Addendum)
Neomia Glass, EDP made aware of patient I- stat troponin results.

## 2013-08-07 NOTE — ED Provider Notes (Signed)
CSN: [id number]     Arrival date & time 08/07/13  2222 History   First MD Initiated Contact with Patient 08/07/13 2253     Chief Complaint  Patient presents with  . Chest Pain     (Consider location/radiation/quality/duration/timing/severity/associated sxs/prior Treatment) HPI Patient is a 58 year old man with a history of HIV. No history of coronary artery disease.  Patient presents to the emergency department with centrally located chest pain which began around 5:30 PM while he was hiking in [facility]. The patient's pain has been persistent and severe since it started. Patient says he fell he did not believe he was going to make it back to his car while on his height. However, he was able to drive himself directly to this emergency department from [facility].  His pain was 10 over 10 on arrival. Patient was treated with sublingual nitroglycerin per protocol. He received 2 doses. Patient had a hypotensive response and when I evaluated him, his systolic blood pressure was 85. Patient was vomiting.  Patient was in moderate pain at the time of my evaluation. He had aspirin prior to arrival. Of note, the patient takes Cialis at his last dose was 3:30 PM yesterday.  Patient denies shortness of breath. Pain is pressure-like, aching and radiates to the back. The patient has aching pain in both of his arms and hands. No history of similar symptoms.   Past Medical History  Diagnosis Date  . HIV infection   . EXTERNAL HEMORRHOIDS WITHOUT MENTION COMP   . ALLERGIC RHINITIS   . Allergic sinusitis   . Anxiety state, unspecified   . Cerumen impaction, right   . Chest pain, unspecified   . DEPRESSION   . Diarrhea   . Erectile dysfunction   . GERD   . HIV DISEASE   . HYPOGONADISM   . Periodontal disease   . PERIPHERAL NEUROPATHY   . PHARYNGITIS, RECURRENT   . Pterygium of left eye   . Urinary frequency   . VERTIGO    History reviewed. No pertinent past surgical history. Family  History  Problem Relation Age of Onset  . Prostate cancer Father   . Prostate cancer Brother   . Prostate cancer Brother   . Cancer Brother   . Osteosarcoma Father   . Cancer Father     prostate  . Cancer Mother     brain tumor   History  Substance Use Topics  . Smoking status: Former Smoker    Types: Cigarettes    Quit date: 03/02/2007  . Smokeless tobacco: Never Used     Comment: pt. no longer smokes  . Alcohol Use: Yes     Comment: weekends    Review of Systems Ten point review of symptoms performed and is negative with the exception of symptoms noted above.     Allergies  Bactrim; Ciprofloxacin; Codeine; Dapsone; Sulfamethoxazole-trimethoprim; and Sulfonamide derivatives  Home Medications   Prior to Admission medications   Medication Sig Start Date End Date Taking? Authorizing Provider  Abacavir-Dolutegravir-Lamivud 323-55-732 MG TABS Take 1 tablet by mouth daily. 04/12/13   Thayer Headings, MD  buprenorphine-naloxone (SUBOXONE) 8-2 MG SUBL SL tablet Place 1 tablet under the tongue daily. Prescribed by Dr. Neta Mends    Historical Provider, MD  tadalafil (CIALIS) 5 MG tablet Take 1 tablet (5 mg total) by mouth daily as needed for erectile dysfunction. 07/15/13   Carlyle Basques, MD  temazepam (RESTORIL) 15 MG capsule Take 1 capsule (15 mg total) by mouth at  bedtime as needed for sleep. 12/29/12   Campbell Riches, MD  testosterone (ANDROGEL) 50 MG/5GM (1%) GEL APPLY EXTERNALLY TO SPECIFIC AREA OF SKIN AS DIRECTED 07/05/13   Campbell Riches, MD   BP 110/71  Pulse 51  Temp(Src) 97.7 F (36.5 C) (Oral)  Resp 9  Ht 5\' 8"  (1.727 m)  Wt 190 lb (86.183 kg)  BMI 28.90 kg/m2  SpO2 100% Physical Exam Gen: well developed and well nourished appearing, ill appearing, vomiting Head: NCAT Eyes: PERL, EOMI Nose: no epistaixis or rhinorrhea Mouth/throat: mucosa is moist and pink Neck: supple, no stridor Lungs: CTA B, no wheezing, rhonchi or rales CV: regular rate and rythm,  good distal pulses.  Abd: soft, notender, nondistended Back: no ttp, no cva ttp Skin: Pale and warm and dry Ext: no edema, normal to inspection Neuro: CN ii-xii grossly intact, no focal deficits Psyche; normal affect,  calm and cooperative.  ED Course  Procedures (including critical care time) Labs Review  Results for orders placed during the hospital encounter of 08/07/13 (from the past 24 hour(s))  CBC     Status: Abnormal   Collection Time    08/07/13 10:34 PM      Result Value Ref Range   WBC 10.7 (*) 4.0 - 10.5 K/uL   RBC 4.71  4.22 - 5.81 MIL/uL   Hemoglobin 15.2  13.0 - 17.0 g/dL   HCT 45.2  39.0 - 52.0 %   MCV 96.0  78.0 - 100.0 fL   MCH 32.3  26.0 - 34.0 pg   MCHC 33.6  30.0 - 36.0 g/dL   RDW 12.9  11.5 - 15.5 %   Platelets 179  150 - 400 K/uL  I-STAT TROPOININ, ED     Status: Abnormal   Collection Time    08/07/13 10:42 PM      Result Value Ref Range   Troponin i, poc 0.75 (*) 0.00 - 0.08 ng/mL   Comment NOTIFIED PHYSICIAN     Comment 3             EKG: nsr, no acute ischemic changes, normal intervals, normal axis, normal qrs complex  CXR: normal cardiac silloute, normal appearing mediastinum, no infiltrates, no acute process identified.   CRITICAL CARE Performed by: Elyn Peers   Total critical care time: 50m  Critical care time was exclusive of separately billable procedures and treating other patients.  Critical care was necessary to treat or prevent imminent or life-threatening deterioration.  Critical care was time spent personally by me on the following activities: development of treatment plan with patient and/or surrogate as well as nursing, discussions with consultants, evaluation of patient's response to treatment, examination of patient, obtaining history from patient or surrogate, ordering and performing treatments and interventions, ordering and review of laboratory studies, ordering and review of radiographic studies, pulse oximetry and  re-evaluation of patient's condition.   MDM   DDX: ACS, pneumothorax, pneumonia, pericardial or pleural effusion, gastritis, GERD/PUD, musculoskeletal pain.   Patient has positive troponin. Hypotensive response to nitroglycerin likely secondary to Cialis. Adequate response to IV fluids with normal blood pressure at this time. Vomiting relieved with Zofran. Patient had aspirin prior to arrival we have treated with Plavix and heparin. Case discussed with Dr. Nadyne Coombes, cardiologist on call. Based on the patient's level acuity and ongoing pain, he would like to take the patient directly to the Cath Lab. He has made the necessary communications to arrange this.    Elyn Peers, MD 08/08/13 [phone]

## 2013-08-07 NOTE — Progress Notes (Signed)
ANTICOAGULATION CONSULT NOTE - Initial Consult  Pharmacy Consult for IV Heparin Indication: chest pain/ACS  Allergies  Allergen Reactions  . Bactrim [Sulfamethoxazole-Tmp Ds]     "Pt stated he died when taking this"  . Ciprofloxacin     REACTION: rash  . Codeine   . Dapsone     REACTION: severe-anaphylaxis  . Sulfamethoxazole-Trimethoprim     Anaphylaxis   . Sulfonamide Derivatives     Patient Measurements: Height: 5\' 8"  (172.7 cm) Weight: 190 lb (86.183 kg) IBW/kg (Calculated) : 68.4 Heparin Dosing Weight: 73.4 kg  Vital Signs: Temp: 97.7 F (36.5 C) (06/13 2229) Temp src: Oral (06/13 2229) BP: 105/68 mmHg (06/13 2345) Pulse Rate: 48 (06/13 2345)  Labs:  Recent Labs  08/07/13 2234  HGB 15.2  HCT 45.2  PLT 179  CREATININE 1.19    Estimated Creatinine Clearance: 74.9 ml/min (by C-G formula based on Cr of 1.19).   Medical History: Past Medical History  Diagnosis Date  . HIV infection   . EXTERNAL HEMORRHOIDS WITHOUT MENTION COMP   . ALLERGIC RHINITIS   . Allergic sinusitis   . Anxiety state, unspecified   . Cerumen impaction, right   . Chest pain, unspecified   . DEPRESSION   . Diarrhea   . Erectile dysfunction   . GERD   . HIV DISEASE   . HYPOGONADISM   . Periodontal disease   . PERIPHERAL NEUROPATHY   . PHARYNGITIS, RECURRENT   . Pterygium of left eye   . Urinary frequency   . VERTIGO     Medications:  Scheduled:  . clopidogrel  300 mg Oral Once  . fentaNYL  50 mcg Intravenous Once   Infusions:  . heparin    . heparin      Assessment: 58 yo presents to ER with CP onset shart 1400-1500, with arm weakness, nausea and diaphoresis. [place] @ 1600. Took Cialisis yesterday.  IV Heparin per Rx for ACS.  Troponin = 0.75  Goal of Therapy:  Heparin level 0.3-0.7 units/ml Monitor platelets by anticoagulation protocol: Yes   Plan:   Baseline coags stat  Heparin 4000 unit bolus x1  Start drip @ 900 units/hr  Daily CBC/HL  Check 1st  HL in 6 hours  Dorrene German 08/07/2013,11:51 PM

## 2013-08-08 ENCOUNTER — Encounter (HOSPITAL_COMMUNITY): Payer: Self-pay | Admitting: *Deleted

## 2013-08-08 ENCOUNTER — Encounter (HOSPITAL_COMMUNITY)
Admission: EM | Disposition: A | Discharge status: Home / Self Care | Payer: No Typology Code available for payment source | Source: Home / Self Care | Attending: Interventional Cardiology

## 2013-08-08 DIAGNOSIS — Z955 Presence of coronary angioplasty implant and graft: Secondary | ICD-10-CM

## 2013-08-08 DIAGNOSIS — Z9861 Coronary angioplasty status: Secondary | ICD-10-CM

## 2013-08-08 DIAGNOSIS — I2510 Atherosclerotic heart disease of native coronary artery without angina pectoris: Secondary | ICD-10-CM

## 2013-08-08 DIAGNOSIS — I214 Non-ST elevation (NSTEMI) myocardial infarction: Secondary | ICD-10-CM | POA: Diagnosis present

## 2013-08-08 DIAGNOSIS — I959 Hypotension, unspecified: Secondary | ICD-10-CM

## 2013-08-08 HISTORY — DX: Atherosclerotic heart disease of native coronary artery without angina pectoris: I25.10

## 2013-08-08 HISTORY — DX: Non-ST elevation (NSTEMI) myocardial infarction: I21.4

## 2013-08-08 HISTORY — PX: PERCUTANEOUS CORONARY STENT INTERVENTION (PCI-S): SHX6016

## 2013-08-08 HISTORY — PX: LEFT HEART CATHETERIZATION WITH CORONARY ANGIOGRAM: SHX5451

## 2013-08-08 HISTORY — DX: Coronary angioplasty status: Z98.61

## 2013-08-08 LAB — CBC WITH DIFFERENTIAL/PLATELET
Basophils Absolute: 0 10*3/uL (ref 0.0–0.1)
Basophils Relative: 0 % (ref 0–1)
Eosinophils Absolute: 0.1 10*3/uL (ref 0.0–0.7)
Eosinophils Relative: 1 % (ref 0–5)
HCT: 40.3 % (ref 39.0–52.0)
HEMOGLOBIN: 13.1 g/dL (ref 13.0–17.0)
LYMPHS ABS: 1.9 10*3/uL (ref 0.7–4.0)
Lymphocytes Relative: 22 % (ref 12–46)
MCH: 31.3 pg (ref 26.0–34.0)
MCHC: 32.5 g/dL (ref 30.0–36.0)
MCV: 96.4 fL (ref 78.0–100.0)
MONOS PCT: 8 % (ref 3–12)
Monocytes Absolute: 0.7 10*3/uL (ref 0.1–1.0)
Neutro Abs: 6 10*3/uL (ref 1.7–7.7)
Neutrophils Relative %: 69 % (ref 43–77)
Platelets: 154 10*3/uL (ref 150–400)
RBC: 4.18 MIL/uL — AB (ref 4.22–5.81)
RDW: 13.3 % (ref 11.5–15.5)
WBC: 8.7 10*3/uL (ref 4.0–10.5)

## 2013-08-08 LAB — COMPREHENSIVE METABOLIC PANEL
ALK PHOS: 69 U/L (ref 39–117)
ALT: 35 U/L (ref 0–53)
AST: 79 U/L — ABNORMAL HIGH (ref 0–37)
Albumin: 3.6 g/dL (ref 3.5–5.2)
BUN: 18 mg/dL (ref 6–23)
CO2: 22 meq/L (ref 19–32)
Calcium: 9 mg/dL (ref 8.4–10.5)
Chloride: 106 mEq/L (ref 96–112)
Creatinine, Ser: 0.98 mg/dL (ref 0.50–1.35)
GFR calc Af Amer: >90 mL/min (ref >90)
GFR calc non Af Amer: >90 mL/min (ref >90)
Glucose, Bld: 112 mg/dL — ABNORMAL HIGH (ref 70–99)
POTASSIUM: 4.5 meq/L (ref 3.7–5.3)
SODIUM: 140 meq/L (ref 137–147)
TOTAL PROTEIN: 6.5 g/dL (ref 6.0–8.3)
Total Bilirubin: 0.5 mg/dL (ref 0.3–1.2)

## 2013-08-08 LAB — CK TOTAL AND CKMB (NOT AT ARMC)
CK, MB: 33.9 ng/mL (ref 0.3–4.0)
CK, MB: 139.9 ng/mL — AB (ref 0.3–4.0)
Relative Index: 11.6 — ABNORMAL HIGH (ref 0.0–2.5)
Relative Index: 14.2 — ABNORMAL HIGH (ref 0.0–2.5)
Total CK: 293 U/L — ABNORMAL HIGH (ref 7–232)
Total CK: 988 U/L — ABNORMAL HIGH (ref 7–232)

## 2013-08-08 LAB — PROTIME-INR
INR: 1 (ref 0.00–1.49)
INR: 1.79 — AB (ref 0.00–1.49)
PROTHROMBIN TIME: 13 s (ref 11.6–15.2)
Prothrombin Time: 20.3 seconds — ABNORMAL HIGH (ref 11.6–15.2)

## 2013-08-08 LAB — APTT
aPTT: 29 seconds (ref 24–37)
aPTT: 73 seconds — ABNORMAL HIGH (ref 24–37)

## 2013-08-08 LAB — TROPONIN I
Troponin I: 10.5 ng/mL (ref <0.30)
Troponin I: >20 ng/mL (ref <0.30)
Troponin I: >20 ng/mL (ref <0.30)

## 2013-08-08 LAB — MRSA PCR SCREENING: MRSA BY PCR: NEGATIVE

## 2013-08-08 LAB — POCT ACTIVATED CLOTTING TIME
ACTIVATED CLOTTING TIME: 0 s
Activated Clotting Time: 123 seconds

## 2013-08-08 LAB — MAGNESIUM: Magnesium: 2.1 mg/dL (ref 1.5–2.5)

## 2013-08-08 LAB — TSH: TSH: 3.62 u[IU]/mL (ref 0.350–4.500)

## 2013-08-08 SURGERY — LEFT HEART CATHETERIZATION WITH CORONARY ANGIOGRAM
Anesthesia: LOCAL

## 2013-08-08 MED ORDER — PRAVASTATIN SODIUM 40 MG PO TABS
40.0000 mg | ORAL_TABLET | Freq: Every day | ORAL | Status: DC
  Administered 2013-08-08 – 2013-08-10 (×3): 40 mg via ORAL
  Filled 2013-08-08 (×4): qty 1

## 2013-08-08 MED ORDER — NON FORMULARY: 40.0000 mg | Freq: Every day | Status: DC

## 2013-08-08 MED ORDER — SODIUM CHLORIDE 0.9 % IV SOLN: 0.2500 mg/kg/h | INTRAVENOUS | Status: AC

## 2013-08-08 MED ORDER — ONDANSETRON HCL 4 MG/2ML IJ SOLN
INTRAMUSCULAR | Status: AC
  Filled 2013-08-08: qty 2

## 2013-08-08 MED ORDER — ASPIRIN 81 MG PO CHEW: 81.0000 mg | CHEWABLE_TABLET | Freq: Every day | ORAL | Status: DC

## 2013-08-08 MED ORDER — LIDOCAINE HCL (PF) 1 % IJ SOLN
INTRAMUSCULAR | Status: AC
  Filled 2013-08-08: qty 30

## 2013-08-08 MED ORDER — ABACAVIR-DOLUTEGRAVIR-LAMIVUD 600-50-300 MG PO TABS: 1.0000 | ORAL_TABLET | Freq: Every day | ORAL | Status: DC

## 2013-08-08 MED ORDER — MIDAZOLAM HCL 2 MG/2ML IJ SOLN
INTRAMUSCULAR | Status: AC
  Filled 2013-08-08: qty 2

## 2013-08-08 MED ORDER — TICAGRELOR 90 MG PO TABS
ORAL_TABLET | ORAL | Status: AC
  Administered 2013-08-08: 90 mg via ORAL
  Filled 2013-08-08: qty 2

## 2013-08-08 MED ORDER — ATORVASTATIN CALCIUM 80 MG PO TABS: 80.0000 mg | ORAL_TABLET | Freq: Every day | ORAL | Status: DC

## 2013-08-08 MED ORDER — NITROGLYCERIN 0.2 MG/ML ON CALL CATH LAB
INTRAVENOUS | Status: AC
  Filled 2013-08-08: qty 1

## 2013-08-08 MED ORDER — ATROPINE SULFATE 0.1 MG/ML IJ SOLN
INTRAMUSCULAR | Status: AC
  Filled 2013-08-08: qty 10

## 2013-08-08 MED ORDER — ASPIRIN EC 81 MG PO TBEC
81.0000 mg | DELAYED_RELEASE_TABLET | Freq: Every day | ORAL | Status: DC
  Administered 2013-08-09 – 2013-08-10 (×2): 81 mg via ORAL
  Filled 2013-08-08 (×2): qty 1

## 2013-08-08 MED ORDER — VERAPAMIL HCL 2.5 MG/ML IV SOLN
INTRAVENOUS | Status: AC
  Filled 2013-08-08: qty 2

## 2013-08-08 MED ORDER — SODIUM CHLORIDE 0.9 % IJ SOLN: 3.0000 mL | INTRAMUSCULAR | Status: DC | PRN

## 2013-08-08 MED ORDER — FENTANYL CITRATE 0.05 MG/ML IJ SOLN
INTRAMUSCULAR | Status: AC
  Filled 2013-08-08: qty 2

## 2013-08-08 MED ORDER — LAMIVUDINE 150 MG PO TABS
300.0000 mg | ORAL_TABLET | Freq: Every day | ORAL | Status: DC
  Administered 2013-08-08 – 2013-08-10 (×3): 300 mg via ORAL
  Filled 2013-08-08 (×3): qty 2

## 2013-08-08 MED ORDER — VERAPAMIL HCL 2.5 MG/ML IV SOLN
INTRAVENOUS | Status: AC
  Filled 2013-08-08: qty 4

## 2013-08-08 MED ORDER — MORPHINE SULFATE 2 MG/ML IJ SOLN: 2.0000 mg | INTRAMUSCULAR | Status: DC | PRN

## 2013-08-08 MED ORDER — SODIUM CHLORIDE 0.9 % IV SOLN: 1.0000 mL/kg/h | INTRAVENOUS | Status: AC

## 2013-08-08 MED ORDER — TICAGRELOR 90 MG PO TABS
90.0000 mg | ORAL_TABLET | Freq: Two times a day (BID) | ORAL | Status: DC
  Administered 2013-08-08 – 2013-08-10 (×5): 90 mg via ORAL
  Filled 2013-08-08 (×6): qty 1

## 2013-08-08 MED ORDER — ABACAVIR SULFATE 300 MG PO TABS
600.0000 mg | ORAL_TABLET | Freq: Every day | ORAL | Status: DC
  Administered 2013-08-08 – 2013-08-10 (×3): 600 mg via ORAL
  Filled 2013-08-08 (×3): qty 2

## 2013-08-08 MED ORDER — SODIUM CHLORIDE 0.9 % IJ SOLN
3.0000 mL | Freq: Two times a day (BID) | INTRAMUSCULAR | Status: DC
  Administered 2013-08-08 – 2013-08-09 (×3): 3 mL via INTRAVENOUS

## 2013-08-08 MED ORDER — SODIUM CHLORIDE 0.9 % IV SOLN
250.0000 mL | INTRAVENOUS | Status: DC | PRN
Start: 2013-08-08 — End: 2013-08-10

## 2013-08-08 MED ORDER — DOLUTEGRAVIR SODIUM 50 MG PO TABS
50.0000 mg | ORAL_TABLET | Freq: Every day | ORAL | Status: DC
  Administered 2013-08-08 – 2013-08-10 (×3): 50 mg via ORAL
  Filled 2013-08-08 (×4): qty 1

## 2013-08-08 MED ORDER — BIVALIRUDIN 250 MG IV SOLR
INTRAVENOUS | Status: AC
  Filled 2013-08-08: qty 250

## 2013-08-08 MED ORDER — HEPARIN (PORCINE) IN NACL 2-0.9 UNIT/ML-% IJ SOLN
INTRAMUSCULAR | Status: AC
  Filled 2013-08-08: qty 1000

## 2013-08-08 NOTE — H&P (Addendum)
Admit date: 08/07/2013 Referring Physician:  Dr. Cheri Guppy Primary Cardiologist:  Dr. Tamala Julian Chief complaint/reason for admission:STEMI  HPI: Albert Brown is a 58 y.o. male who is 58 years old and has a history of HIV since 2003. He is healthy. His counts are excellent. He is on a stable medical regimen for HIV control. Over the past 2-4 months he is noted intermittent episodes of tightness in the chest. These episodes are precordial. Not particularly exertional. At times the discomfort can last hours to days. Additionally, he is noted exertional dyspnea with walking fast and jogging. He was recently evaluated in March by Dr. Tamala Julian and echo and ETT were recommended but never done.  Today, while hiking, he developed severe substernal chest pain across his chest and into his arms bilaterally associated with severe SOB and diaphoresis. This started about 5:30pm.  The pain persisted and did not think he would make it back to his car.  He got to his car and drove to the ER.  On arrival his CP was 10/10 and he was given SL NTG and had a hypotensive BP response and then started vomiting.  Of not he had taken Cialis yesterday at 3:30pm.  He was given IVF hydration and started on ASA/Plavix and Heparin IV.  His EKG showed NSR with no acute changes.  Due to ongoing CP with elevated troponin and CPK with hypotension he was transferred from [facility] to [facility] cath lab for emergent cath.     PMH:    Past Medical History  Diagnosis Date  . HIV infection   . EXTERNAL HEMORRHOIDS WITHOUT MENTION COMP   . ALLERGIC RHINITIS   . Allergic sinusitis   . Anxiety state, unspecified   . Cerumen impaction, right   . Chest pain, unspecified   . DEPRESSION   . Diarrhea   . Erectile dysfunction   . GERD   . HIV DISEASE   . HYPOGONADISM   . Periodontal disease   . PERIPHERAL NEUROPATHY   . PHARYNGITIS, RECURRENT   . Pterygium of left eye   . Urinary frequency   . VERTIGO     PSH:   History reviewed. No pertinent past  surgical history.  ALLERGIES:   Bactrim; Ciprofloxacin; Codeine; Dapsone; Sulfamethoxazole-trimethoprim; and Sulfonamide derivatives  Prior to Admit Meds:   Prescriptions prior to admission  Medication Sig Dispense Refill  . Abacavir-Dolutegravir-Lamivud 600-50-300 MG TABS Take 1 tablet by mouth daily.  30 tablet  6  . buprenorphine-naloxone (SUBOXONE) 8-2 MG SUBL SL tablet Place 1 tablet under the tongue daily. Prescribed by Dr. Neta Mends      . tadalafil (CIALIS) 5 MG tablet Take 1 tablet (5 mg total) by mouth daily as needed for erectile dysfunction.  30 tablet  0  . temazepam (RESTORIL) 15 MG capsule Take 1 capsule (15 mg total) by mouth at bedtime as needed for sleep.  30 capsule  0  . testosterone (ANDROGEL) 50 MG/5GM (1%) GEL APPLY EXTERNALLY TO SPECIFIC AREA OF SKIN AS DIRECTED  150 g  2   Family HX:    Family History  Problem Relation Age of Onset  . Prostate cancer Father   . Prostate cancer Brother   . Prostate cancer Brother   . Cancer Brother   . Osteosarcoma Father   . Cancer Father     prostate  . Cancer Mother     brain tumor   Social HX:    History   Social History  . Marital Status: Single  Spouse Name: N/A    Number of Children: N/A  . Years of Education: N/A   Occupational History  . Not on file.   Social History Main Topics  . Smoking status: Former Smoker    Types: Cigarettes    Quit date: 03/02/2007  . Smokeless tobacco: Never Used     Comment: pt. no longer smokes  . Alcohol Use: Yes     Comment: weekends  . Drug Use: No  . Sexual Activity: No     Comment: pt. refused condoms   Other Topics Concern  . Not on file   Social History Narrative  . No narrative on file     ROS:  All 11 ROS were addressed and are negative except what is stated in the HPI  PHYSICAL EXAM Filed Vitals:   08/07/13 2345  BP: 105/68  Pulse: 48  Temp:   Resp: 12   General: Well developed, well nourished, in no acute distress Head: Eyes PERRLA, No  xanthomas.   Normal cephalic and atramatic  Lungs:   Clear bilaterally to auscultation and percussion. Heart:   HRRR S1 S2 Pulses are 2+ & equal.            No carotid bruit. No JVD.  No abdominal bruits. No femoral bruits. Abdomen: Bowel sounds are positive, abdomen soft and non-tender without masses  Extremities:   No clubbing, cyanosis or edema.  DP +1 Neuro: Alert and oriented X 3. Psych:  Good affect, responds appropriately   Labs:   Lab Results  Component Value Date   WBC 10.7* 08/07/2013   HGB 15.2 08/07/2013   HCT 45.2 08/07/2013   MCV 96.0 08/07/2013   PLT 179 08/07/2013    Recent Labs Lab 08/07/13 2234  NA 142  K 4.2  CL 104  CO2 24  BUN 21  CREATININE 1.19  CALCIUM 9.8  GLUCOSE 130*   Lab Results  Component Value Date   CKTOTAL 293* 08/07/2013   CKMB 33.9* 08/07/2013   No results found for this basename: PTT   Lab Results  Component Value Date   INR 1.00 08/07/2013     Lab Results  Component Value Date   CHOL 177 03/09/2013   CHOL 160 07/11/2011   CHOL 173 09/06/2009   Lab Results  Component Value Date   HDL 44 03/09/2013   HDL 44 07/11/2011   HDL 40 09/06/2009   Lab Results  Component Value Date   LDLCALC 108* 03/09/2013   LDLCALC 96 07/11/2011   LDLCALC 101* 09/06/2009   Lab Results  Component Value Date   TRIG 125 03/09/2013   TRIG 101 07/11/2011   TRIG 160* 09/06/2009   Lab Results  Component Value Date   CHOLHDL 4.0 03/09/2013   CHOLHDL 3.6 07/11/2011   CHOLHDL 4.3 Ratio 09/06/2009   No results found for this basename: LDLDIRECT      Radiology:  Dg Chest Port 1 View  08/07/2013   CLINICAL DATA:  Generalized chest pain. Nausea and diaphoresis. History of smoking.  EXAM: PORTABLE CHEST - 1 VIEW  COMPARISON:  Chest radiograph performed 05/16/2009  FINDINGS: The lungs are well-aerated and clear. There is no evidence of focal opacification, pleural effusion or pneumothorax.  The cardiomediastinal silhouette is within normal limits. No acute osseous  abnormalities are seen.  IMPRESSION: No acute cardiopulmonary process seen.   Electronically Signed   By: Garald Balding M.D.   On: 08/07/2013 23:54    EKG:  NSR with no ST  changes  ASSESSMENT:  1.  NSTEMI complicated by hypotension with ongoing severe CP with evidence of active myocardial ischemia with elevated troponin and CPK. 2.  HIV infection 3.  Hypotension possible due to NTG use in the setting of recent Cialis use  PLAN:   1. Admit to CCU 2. IV Heparin gtt 3. ASA/statin  4. He was started on Plavix in ER 5. Pravastatin 40mg  daily - per pharmacy since he is on HIV meds 6. Emergent cath per Dr. Ellyn Hack 7.  2D echo in am to assess LVF 8.  No NTG due to recent Cialis use and hypotension in ER after NTG use  Sueanne Margarita, MD  08/08/2013  12:41 AM

## 2013-08-08 NOTE — Interval H&P Note (Signed)
History and Physical Interval Note:  08/08/2013 2:40 AM  Albert Brown  has presented today for surgery, with the diagnosis of NSTEMI.  The various methods of treatment have been discussed with the patient and family. After consideration of risks, benefits and other options for treatment, the patient has consented to  Procedure(s): LEFT HEART CATHETERIZATION WITH CORONARY ANGIOGRAM (N/A) +/- PCI  as a surgical intervention .    The patient's history has been reviewed, patient examined, no change in status, stable for surgery.  I have reviewed the patient's chart and labs.  Questions were answered to the patient's satisfaction.     HARDING,DAVID W  Cath Lab Visit (complete for each Cath Lab visit)  Clinical Evaluation Leading to the Procedure:   ACS: yes  Non-ACS:    Anginal Classification: CCS IV  Anti-ischemic medical therapy: No Therapy  Non-Invasive Test Results: No non-invasive testing performed  Prior CABG: No previous CABG

## 2013-08-08 NOTE — ED Notes (Signed)
32mcg Fentanyl wasted.  Witnessed by [employer] RN

## 2013-08-08 NOTE — ED Notes (Signed)
63mcg Fentanyl wasted.  Witnessed by Abelardo Diesel. RN

## 2013-08-08 NOTE — Progress Notes (Signed)
Right groin femoral sheath removed per protocol. VSS,pt tolerated well. Site level 0. Continue to monitor per protocol.  Albert Brown

## 2013-08-08 NOTE — Progress Notes (Signed)
Patient Name: Albert Brown      SUBJECTIVE: 38 with HIV and excellent counts admitted last pm with + Tn ongoing pain but without diagnostic ECG changes Cath >>RCA Total  Rx  With  DES with no significant residual disease  No chest pain  Uncomfortable lying in bed      Past Medical History  Diagnosis Date  . HIV infection   . EXTERNAL HEMORRHOIDS WITHOUT MENTION COMP   . ALLERGIC RHINITIS   . Allergic sinusitis   . Anxiety state, unspecified   . Cerumen impaction, right   . Chest pain, unspecified   . DEPRESSION   . Diarrhea   . Erectile dysfunction   . GERD   . HIV DISEASE   . HYPOGONADISM   . Periodontal disease   . PERIPHERAL NEUROPATHY   . PHARYNGITIS, RECURRENT   . Pterygium of left eye   . Urinary frequency   . VERTIGO     Scheduled Meds:  Scheduled Meds: . abacavir  600 mg Oral Daily  . [START ON 08/09/2013] aspirin EC  81 mg Oral Daily  . dolutegravir  50 mg Oral Daily  . lamiVUDine  300 mg Oral Daily  . pravastatin  40 mg Oral Daily  . sodium chloride  3 mL Intravenous Q12H  . ticagrelor  90 mg Oral BID   Continuous Infusions:  sodium chloride, morphine injection, sodium chloride    PHYSICAL EXAM Filed Vitals:   08/08/13 0615 08/08/13 0630 08/08/13 0645 08/08/13 0700  BP:    91/59  Pulse: 52 54 57 56  Temp:    97.9 F (36.6 C)  TempSrc:    Oral  Resp: 16 15 11 19   Height:      Weight:      SpO2: 99% 99% 98% 98%    Well developed and nourished in no acute distress HENT normal Neck supple with JVP-flat Clear Regular rate and rhythm, no murmurs or gallops Abd-soft with active BS No Clubbing cyanosis edema Groin without swelling tenderness or ecchymosis Skin-warm and dry A & Oriented  Grossly normal sensory and motor function  TELEMETRY: Reviewed telemetry pt in *NSR    Intake/Output Summary (Last 24 hours) at 08/08/13 1157 Last data filed at 08/08/13 0800  Gross per 24 hour  Intake 630.05 ml  Output    400 ml    Net 230.05 ml    LABS: Basic Metabolic Panel:  Recent Labs Lab 08/07/13 2234 08/08/13 0552  NA 142 140  K 4.2 4.5  CL 104 106  CO2 24 22  GLUCOSE 130* 112*  BUN 21 18  CREATININE 1.19 0.98  CALCIUM 9.8 9.0  MG  --  2.1   Cardiac Enzymes:  Recent Labs  08/07/13 2234 08/08/13 0552 08/08/13 0919  CKTOTAL 293* 988*  --   CKMB 33.9* 139.9*  --   TROPONINI  --  10.50* >20.00*   CBC:  Recent Labs Lab 08/07/13 2234 08/08/13 0552  WBC 10.7* 8.7  NEUTROABS  --  6.0  HGB 15.2 13.1  HCT 45.2 40.3  MCV 96.0 96.4  PLT 179 154   PROTIME:  Recent Labs  08/07/13 2234 08/08/13 0552  LABPROT 13.0 20.3*  INR 1.00 1.79*   Liver Function Tests:  Recent Labs  08/08/13 0552  AST 79*  ALT 35  ALKPHOS 69  BILITOT 0.5  PROT 6.5  ALBUMIN 3.6   No results found for this basename: LIPASE, AMYLASE,  in the  last 72 hours BNP: BNP (last 3 results)  Recent Labs  08/07/13 2234  PROBNP 54.8   D-Dimer: No results found for this basename: DDIMER,  in the last 72 hours Hemoglobin A1C: No results found for this basename: HGBA1C,  in the last 72 hours Fasting Lipid Panel: No results found for this basename: CHOL, HDL, LDLCALC, TRIG, CHOLHDL, LDLDIRECT,  in the last 72 hours Thyroid Function Tests:  Recent Labs  08/08/13 0552  TSH 3.620   Anemia Panel: No results found for this basename: VITAMINB12, FOLATE, FERRITIN, TIBC, IRON, RETICCTPCT,  in the last 72 hours  ASSESSMENT AND PLAN:  Principal Problem:   NSTEMI (non-ST elevated myocardial infarction) Active Problems:   CAD S/P percutaneous coronary angioplasty - Xience outline DES to mid RCA (2.75 mm x 33 mm --> 3.0 mm)   Presence of drug coated stent in right coronary artery sinus bradycardia Pain meds for back and OOB dont think related to groin bleed  Continue current meds Signed, Virl Axe MD  08/08/2013

## 2013-08-08 NOTE — Progress Notes (Signed)
Utilization Review Completed.Albert Brown T6/14/2015  

## 2013-08-08 NOTE — CV Procedure (Signed)
CARDIAC CATHETERIZATION AND PERCUTANEOUS CORONARY INTERVENTION REPORT  NAME:  NICOLO TOMKO   MRN: [id number] DOB:  01/04/1956   ADMIT DATE: 08/07/2013 Procedure Date: 08/08/2013  INTERVENTIONAL CARDIOLOGIST: [name], M.D., MS PRIMARY CARE PROVIDER: [name], MD PRIMARY CARDIOLOGIST:  Daneen Schick, MD  PATIENT:  Albert Brown is a 58 y.o. male with a PMH notable for well controlled HIV +, but is otherwise healthy & active (Viral load almost undetectable & CD4 count excellent on stable anti-retroviral medications). He has been noting intermittent chest & bilateral arm pains - both exertional & at rest (often @ night) -- for ~2-3 months.  He describes the chest sensation as a "tightness". He has also noted increasing fatigue with decreased exercise tolerance, noting exertional dyspnea with walking fast or jogging.  He was recently evaluated in March by Dr. Tamala Julian and echo and ETT were recommended but never done.   Yesterday (6/13), while hiking, he developed severe substernal chest pain across his chest and into his arms bilaterally associated with severe SOB and diaphoresis. This started about 5:30pm. The pain persisted and did not think he would make it back to his car. He got to his car and drove to the ER. On arrival his CP was 10/10 and he was given SL NTG and had a hypotensive BP response and then started vomiting. Of not he had taken Cialis yesterday at 3:30pm. He was given IVF hydration and started on ASA/Plavix and Heparin IV. His EKG showed NSR with no acute changes. Due to ongoing CP with elevated troponin and CPK with hypotension he was transferred from [facility] to [facility] cath lab for emergent cath.   PRE-OPERATIVE DIAGNOSIS:    NON-STEMI with persistent angina  PROCEDURES PERFORMED:    Left Heart Catheterization with Native Coronary Angiography  via Right Radial Artery   Successful Revascularization of the 100% thrombotically occluded mid RCA with PTCA & a single Xience  Alpine DES 2.75 mm x 33 mm (post-dilated to ~3.77m)  PROCEDURE: The patient was brought to the [facility] in the fasting state and prepped and draped in the usual sterile fashion for Right Radial & Common Femoral artery access. A modified Allen's test was performed on the Right wrist demonstrating excellent collateral flow for radial access.   Sterile technique was used including antiseptics, cap, gloves, gown, hand hygiene, mask and sheet. Skin prep: Chlorhexidine.   Consent: Risks of procedure as well as the alternatives and risks of each were explained to the (patient/caregiver). Consent for procedure obtained.   Time Out: Verified patient identification, verified procedure, site/side was marked, verified correct patient position, special equipment/implants available, medications/allergies/relevent history reviewed, required imaging and test results available. Performed.  Access:   Right Radial Artery: 6 Fr Sheath -  Seldinger Technique (Angiocath Micropuncture Kit)  Radial Cocktail - 10 mL; IV Angiomax Bolus & drip  When Radial approach abandoned due to severe spasm with Guide Catheter torquing, RFA access was used.  Right Common Femoral Artery: 6 Fr Sheath -  fluoroscopically guided modified Seldinger Technique  Left Heart Catheterization: 5 Fr Catheters advanced or exchanged over a Long Exchange Safety J-wire; TIG 4.0 catheter advanced first.  Right Coronary Artery Cineangiography: TIG 4.0 Catheter  Left Coronary Artery Cineangiography: JL3.5 Catheter  -- additional 518mIV Vera Multiple 6 Fr Guide catheters (JR4, Williams R & AR1) advanced, but with AR1, there was significant spasm. PCI performed via Femoral Access.   LV Hemodynamics (LV Gram): Angled Pigtail (  post PCI via Femoral Sheath)  Sheath will be removed in the CCU with manual pressure for hemostasis.  TR Band: 1150  Hours; 11 mL air  FINDINGS:  Hemodynamics:   Central Aortic  Pressure / Mean: 96/65/78 mmHg  Left Ventricular Pressure / LVEDP: 104/2/11 mmHg  Left Ventriculography:  EF: ~60-65%  Wall Motion: mild basal to mid inferior hypokinesis.  Coronary Anatomy:  Dominance: Right   Left Main: Large-caliber vessel that bifurcates into the LAD and nondominant Circumflex. Angiographically normal. There is a small, insignificant Ramus Intermedius.  LAD: Large-caliber vessel that gives off a major proximal first diagonal branch (D1) that courses almost is a Ramus Intermedius, followed by a smaller D2 and D3. The LAD reaches down to wrap the apex. Minimal (10-20%) luminal irregularities are noted at the branch point of significant diagonal vessels.  D1: Large-caliber vessel, angiographically normal  D2: Small-caliber vessel, angiographically normal.   D3: Moderate caliber vessel, angiographically normal  Left Circumflex: Large caliber vessel that essentially courses as a lateral OM branch with a very small AV groove branch and atrial branch. Minimal luminal irregularities.   RCA: Large-caliber, dominant vessel that has a early mid 70% stenosis followed by a tapering 100% thrombotic occlusion.  Post PCI angiography reveals a long segment of 50-60% stenosis followed by a 70% stenosis prior to the crux. The vessel then normalizes into a relatively normal distal RCA bifurcating into the Right Posterior AV Groove Branch (RPAV) and small caliber RPDA. There is 1 RPL that is major.  After reviewing the initial angiography, the culprit lesion was thought to be 100% about occluded mid RCA.  Preparation were made to proceed with PCI on this lesion.  Percutaneous Coronary Intervention:    Lesion: 100% mid RCA - reduced to 0%; TIMI 0 flow pre-, TIMI 3 flow post  Radial access guide catheters as described above were used, however the AR-1 which did seem to fit best was becoming more difficult to direct due to brachial artery spasm, therefore the decision was made to  abort radial access and convert to femoral artery access. The catheter was removed completely out of the body along with a wire.  Additional verapamil was administered through the sheath and the sheath was removed with a TR band placed.  Femoral access with a 6 French sheath as above:  Guide: 6 Fr   3 DRC  Guidewire: Pro-water -- initially the wire would only go to what appeared to be the crux of the RCA, therefore the predilation balloon was loaded on the wire it was successfully advanced following initial balloon angioplasty into the distal RCA/RPL.  Predilation Balloon: Euphora 2.5 mm x 12 mm;   Multiple inflations 10-12 Atm x 20 Sec  Post angioplasty images revealed TIMI 3 flow distally with a long segment of disease in the mid RCA.  Stent: Xience Alpine DES 2.75 mm x 33 mm;   Max inflation 16 Atm x 30 Sec - final diameter ~3.0 mm    Post-dilation Balloon: [place] Euphora 3.0  mm x 20  mm;   16  Atm x 30  Sec X 2 inflations   Final Diameter: 3.0 mm  Post deployment angiography in multiple views, with and without guidewire in place revealed  accurate stent  deployment and lesion coverage.  There was no evidence of dissection or perforation.  MEDICATIONS:  Anesthesia:  Local Lidocaine 2 mL for radial; 20 mL for femoral   Sedation:  1  mg IV Versed, 25  mcg IV  fentanyl ;   Premedication: 4000 units IV heparin   Omnipaque Contrast: 250  ml  Anticoagulation:  Angiomax Bolus & drip  Anti-Platelet Agent:  Brilinta 180 mg  Radial Cocktail: 5 mg Verapamil, 400 mcg NTG, 2 ml 2% Lidocaine in 10 ml NS; 2 additional doses of IA Verapamil (via catheter in axillary artery & via sheath) were administered for catheter induced brachial & radial spasm.  Zofran 4 mg IV  500 mL NS bolus   PATIENT DISPOSITION:    The patient was transferred to the PACU holding area in a hemodynamicaly stable, chest pain free condition.  The patient tolerated the procedure well, and there were no  complications.  EBL:   < 20  ml  The patient was stable before, during, and after the procedure.  POST-OPERATIVE DIAGNOSIS:    Severe single-vessel CAD with what appeared to be a subacute total occlusion of the mid RCA.  Successful long segmental PCI with a single Xience Alpine DES stent restoring TIMI-3 flow  Well-preserved LVEF with normal EDP  PLAN OF CARE:  Admit to TCU per standard post radial and femoral cath care. Continue Angiomax for 2 hours post PCI.   Dual antiplatelet therapy for a minimum of 1 year.  Echocardiogram ordered,   Statin, beta blocker if rate tolerates, ACE inhibitor/ARB if blood pressure tolerates.  Avoid nitrates due to recent Cialis use  Case Management and Cardiac Rehabilitation consult  Anticipate fast-track discharge. He will followup with Dr. Daneen Schick at the [facility] office.  [name], M.D., M.S. [facility] [address] [place], [place]  [address]  [phone]  08/08/2013 2:46 AM

## 2013-08-08 NOTE — Progress Notes (Signed)
Echo Lab  2D Echocardiogram completed.  [place], RDCS 08/08/2013 10:58 AM

## 2013-08-08 NOTE — ED Notes (Signed)
Critical Lab  CK 33.9 Reported by [facility] (lab)  Primary nurse notified

## 2013-08-08 NOTE — Care Management Note (Addendum)
    Page 1 of 1   08/10/2013     10:26:48 AM CARE MANAGEMENT NOTE 08/10/2013  Patient:  Albert Brown, Albert Brown   Account Number:  [id number]  Date Initiated:  08/08/2013  Documentation initiated by:  GRAVES-BIGELOW,Willye Javier  Subjective/Objective Assessment:   Pt admitted for STEMI. Pt initiated on Brilinta. Pt uses [facility] and [address]. [facility] has medication available.     Action/Plan:   Pt will need prior authorization for Brilinta. MD please call [phone] for co pay to be given. CM will provide pt with Brilinta Card. Pt will need Rx for 30 day free no refills and original with refills.   Anticipated DC Date:  08/09/2013   Anticipated DC Plan:  [place]  CM consult      Choice offered to / List presented to:             Status of service:  Completed, signed off Medicare Important Message given?  NO (If response is "NO", the following Medicare IM given date fields will be blank) Date Medicare IM given:   Date Additional Medicare IM given:    Discharge Disposition:  HOME/SELF CARE  Per UR Regulation:  Reviewed for med. necessity/level of care/duration of stay  If discussed at Long Length of Stay Meetings, dates discussed:    Comments:  Pt will need prior athorization for Brilinta- MD please call [phone]. Thanks Jacqlyn Krauss, RN,BSN [phone]

## 2013-08-09 DIAGNOSIS — R079 Chest pain, unspecified: Secondary | ICD-10-CM

## 2013-08-09 DIAGNOSIS — Z9861 Coronary angioplasty status: Secondary | ICD-10-CM

## 2013-08-09 LAB — POCT ACTIVATED CLOTTING TIME: Activated Clotting Time: 426 seconds

## 2013-08-09 LAB — TROPONIN I
Troponin I: 7.02 ng/mL (ref <0.30)
Troponin I: 8.37 ng/mL (ref <0.30)
Troponin I: >20 ng/mL (ref <0.30)
Troponin I: >20 ng/mL (ref <0.30)

## 2013-08-09 MED FILL — Sodium Chloride IV Soln 0.9%: INTRAVENOUS | Qty: 50 | Status: AC

## 2013-08-09 NOTE — Progress Notes (Signed)
Subjective:  POD # 1 Inferior NSTEMI. No CP/SOB  Objective:  Temp:  [97.9 F (36.6 C)-98.5 F (36.9 C)] 97.9 F (36.6 C) (06/15 1128) Pulse Rate:  [59-77] 62 (06/15 1128) Resp:  [12-14] 14 (06/14 1330) BP: (87-118)/(56-66) 118/66 mmHg (06/15 1128) SpO2:  [95 %-98 %] 97 % (06/15 1128) Weight change:   Intake/Output from previous day: 06/14 0701 - 06/15 0700 In: 1046.2 [P.O.:960; I.V.:86.2] Out: 400 [Urine:400]  Intake/Output from this shift: Total I/O In: 360 [P.O.:360] Out: 800 [Urine:800]  Physical Exam: General appearance: alert and no distress Neck: no adenopathy, no carotid bruit, no JVD, supple, symmetrical, trachea midline and thyroid not enlarged, symmetric, no tenderness/mass/nodules Lungs: clear to auscultation bilaterally Heart: regular rate and rhythm, S1, S2 normal, no murmur, click, rub or gallop Extremities: extremities normal, atraumatic, no cyanosis or edema  Lab Results: Results for orders placed during the hospital encounter of 08/07/13 (from the past 48 hour(s))  PRO B NATRIURETIC PEPTIDE     Status: None   Collection Time    08/07/13 10:34 PM      Result Value Ref Range   Pro B Natriuretic peptide (BNP) 54.8  0 - 125 pg/mL  CBC     Status: Abnormal   Collection Time    08/07/13 10:34 PM      Result Value Ref Range   WBC 10.7 (*) 4.0 - 10.5 K/uL   RBC 4.71  4.22 - 5.81 MIL/uL   Hemoglobin 15.2  13.0 - 17.0 g/dL   HCT 45.2  39.0 - 52.0 %   MCV 96.0  78.0 - 100.0 fL   MCH 32.3  26.0 - 34.0 pg   MCHC 33.6  30.0 - 36.0 g/dL   RDW 12.9  11.5 - 15.5 %   Platelets 179  150 - 400 K/uL  CK TOTAL AND CKMB     Status: Abnormal   Collection Time    08/07/13 10:34 PM      Result Value Ref Range   Total CK 293 (*) 7 - 232 U/L   CK, MB 33.9 (*) 0.3 - 4.0 ng/mL   Comment: CRITICAL RESULT CALLED TO, READ BACK BY AND VERIFIED WITH:     AMIE WARD RN [phone] GREEN R   Relative Index 11.6 (*) 0.0 - 2.5   Comment: Performed at [place] PANEL     Status: Abnormal   Collection Time    08/07/13 10:34 PM      Result Value Ref Range   Sodium 142  137 - 147 mEq/L   Potassium 4.2  3.7 - 5.3 mEq/L   Chloride 104  96 - 112 mEq/L   CO2 24  19 - 32 mEq/L   Glucose, Bld 130 (*) 70 - 99 mg/dL   BUN 21  6 - 23 mg/dL   Creatinine, Ser 1.19  0.50 - 1.35 mg/dL   Calcium 9.8  8.4 - 10.5 mg/dL   GFR calc non Af Amer 67 (*) >90 mL/min   GFR calc Af Amer 78 (*) >90 mL/min   Comment: (NOTE)     The eGFR has been calculated using the CKD EPI equation.     This calculation has not been validated in all clinical situations.     eGFR's persistently <90 mL/min signify possible Chronic Kidney     Disease.  APTT     Status: None   Collection Time    08/07/13 10:34 PM  Result Value Ref Range   aPTT 29  24 - 37 seconds  PROTIME-INR     Status: None   Collection Time    08/07/13 10:34 PM      Result Value Ref Range   Prothrombin Time 13.0  11.6 - 15.2 seconds   INR 1.00  0.00 - 1.49  I-STAT TROPOININ, ED     Status: Abnormal   Collection Time    08/07/13 10:42 PM      Result Value Ref Range   Troponin i, poc 0.75 (*) 0.00 - 0.08 ng/mL   Comment NOTIFIED PHYSICIAN     Comment 3            Comment: Due to the release kinetics of cTnI,     a negative result within the first hours     of the onset of symptoms does not rule out     myocardial infarction with certainty.     If myocardial infarction is still suspected,     repeat the test at appropriate intervals.  MRSA PCR SCREENING     Status: None   Collection Time    08/08/13  3:45 AM      Result Value Ref Range   MRSA by PCR NEGATIVE  NEGATIVE   Comment:            The GeneXpert MRSA Assay (FDA     approved for NASAL specimens     only), is one component of a     comprehensive MRSA colonization     surveillance program. It is not     intended to diagnose MRSA     infection nor to guide or     monitor treatment for     MRSA infections.  TROPONIN I      Status: Abnormal   Collection Time    08/08/13  5:52 AM      Result Value Ref Range   Troponin I 10.50 (*) <0.30 ng/mL   Comment:            Due to the release kinetics of cTnI,     a negative result within the first hours     of the onset of symptoms does not rule out     myocardial infarction with certainty.     If myocardial infarction is still suspected,     repeat the test at appropriate intervals.     CRITICAL RESULT CALLED TO, READ BACK BY AND VERIFIED WITH:     Robbie Lis [id number] MCCAULEG  PROTIME-INR     Status: Abnormal   Collection Time    08/08/13  5:52 AM      Result Value Ref Range   Prothrombin Time 20.3 (*) 11.6 - 15.2 seconds   INR 1.79 (*) 0.00 - 1.49  APTT     Status: Abnormal   Collection Time    08/08/13  5:52 AM      Result Value Ref Range   aPTT 73 (*) 24 - 37 seconds   Comment:            IF BASELINE aPTT IS ELEVATED,     SUGGEST PATIENT RISK ASSESSMENT     BE USED TO DETERMINE APPROPRIATE     ANTICOAGULANT THERAPY.  CBC WITH DIFFERENTIAL     Status: Abnormal   Collection Time    08/08/13  5:52 AM      Result Value Ref Range   WBC 8.7  4.0 - 10.5 K/uL  RBC 4.18 (*) 4.22 - 5.81 MIL/uL   Hemoglobin 13.1  13.0 - 17.0 g/dL   HCT 40.3  39.0 - 52.0 %   MCV 96.4  78.0 - 100.0 fL   MCH 31.3  26.0 - 34.0 pg   MCHC 32.5  30.0 - 36.0 g/dL   RDW 13.3  11.5 - 15.5 %   Platelets 154  150 - 400 K/uL   Neutrophils Relative % 69  43 - 77 %   Neutro Abs 6.0  1.7 - 7.7 K/uL   Lymphocytes Relative 22  12 - 46 %   Lymphs Abs 1.9  0.7 - 4.0 K/uL   Monocytes Relative 8  3 - 12 %   Monocytes Absolute 0.7  0.1 - 1.0 K/uL   Eosinophils Relative 1  0 - 5 %   Eosinophils Absolute 0.1  0.0 - 0.7 K/uL   Basophils Relative 0  0 - 1 %   Basophils Absolute 0.0  0.0 - 0.1 K/uL  TSH     Status: None   Collection Time    08/08/13  5:52 AM      Result Value Ref Range   TSH 3.620  0.350 - 4.500 uIU/mL  COMPREHENSIVE METABOLIC PANEL     Status: Abnormal   Collection  Time    08/08/13  5:52 AM      Result Value Ref Range   Sodium 140  137 - 147 mEq/L   Potassium 4.5  3.7 - 5.3 mEq/L   Chloride 106  96 - 112 mEq/L   CO2 22  19 - 32 mEq/L   Glucose, Bld 112 (*) 70 - 99 mg/dL   BUN 18  6 - 23 mg/dL   Creatinine, Ser 0.98  0.50 - 1.35 mg/dL   Calcium 9.0  8.4 - 10.5 mg/dL   Total Protein 6.5  6.0 - 8.3 g/dL   Albumin 3.6  3.5 - 5.2 g/dL   AST 79 (*) 0 - 37 U/L   ALT 35  0 - 53 U/L   Alkaline Phosphatase 69  39 - 117 U/L   Total Bilirubin 0.5  0.3 - 1.2 mg/dL   GFR calc non Af Amer >90  >90 mL/min   GFR calc Af Amer >90  >90 mL/min   Comment: (NOTE)     The eGFR has been calculated using the CKD EPI equation.     This calculation has not been validated in all clinical situations.     eGFR's persistently <90 mL/min signify possible Chronic Kidney     Disease.  MAGNESIUM     Status: None   Collection Time    08/08/13  5:52 AM      Result Value Ref Range   Magnesium 2.1  1.5 - 2.5 mg/dL  CK TOTAL AND CKMB     Status: Abnormal   Collection Time    08/08/13  5:52 AM      Result Value Ref Range   Total CK 988 (*) 7 - 232 U/L   CK, MB 139.9 (*) 0.3 - 4.0 ng/mL   Comment: CRITICAL RESULT CALLED TO, READ BACK BY AND VERIFIED WITH:     TOMLINSONTRN [id number] MCCAULEG   Relative Index 14.2 (*) 0.0 - 2.5  POCT ACTIVATED CLOTTING TIME     Status: None   Collection Time    08/08/13  7:50 AM      Result Value Ref Range   Activated Clotting Time 0    POCT ACTIVATED  CLOTTING TIME     Status: None   Collection Time    08/08/13  9:12 AM      Result Value Ref Range   Activated Clotting Time 123    TROPONIN I     Status: Abnormal   Collection Time    08/08/13  9:19 AM      Result Value Ref Range   Troponin I >20.00 (*) <0.30 ng/mL   Comment:            Due to the release kinetics of cTnI,     a negative result within the first hours     of the onset of symptoms does not rule out     myocardial infarction with certainty.     If myocardial  infarction is still suspected,     repeat the test at appropriate intervals.     CRITICAL VALUE NOTED.  VALUE IS CONSISTENT WITH PREVIOUSLY REPORTED AND CALLED VALUE.  TROPONIN I     Status: Abnormal   Collection Time    08/08/13  6:07 PM      Result Value Ref Range   Troponin I >20.00 (*) <0.30 ng/mL   Comment:            Due to the release kinetics of cTnI,     a negative result within the first hours     of the onset of symptoms does not rule out     myocardial infarction with certainty.     If myocardial infarction is still suspected,     repeat the test at appropriate intervals.     CRITICAL VALUE NOTED.  VALUE IS CONSISTENT WITH PREVIOUSLY REPORTED AND CALLED VALUE.  TROPONIN I     Status: Abnormal   Collection Time    08/08/13 11:56 PM      Result Value Ref Range   Troponin I >20.00 (*) <0.30 ng/mL   Comment:            Due to the release kinetics of cTnI,     a negative result within the first hours     of the onset of symptoms does not rule out     myocardial infarction with certainty.     If myocardial infarction is still suspected,     repeat the test at appropriate intervals.     CRITICAL VALUE NOTED.  VALUE IS CONSISTENT WITH PREVIOUSLY REPORTED AND CALLED VALUE.  TROPONIN I     Status: Abnormal   Collection Time    08/09/13  2:35 AM      Result Value Ref Range   Troponin I >20.00 (*) <0.30 ng/mL   Comment:            Due to the release kinetics of cTnI,     a negative result within the first hours     of the onset of symptoms does not rule out     myocardial infarction with certainty.     If myocardial infarction is still suspected,     repeat the test at appropriate intervals.     CRITICAL VALUE NOTED.  VALUE IS CONSISTENT WITH PREVIOUSLY REPORTED AND CALLED VALUE.  TROPONIN I     Status: Abnormal   Collection Time    08/09/13 11:00 AM      Result Value Ref Range   Troponin I 7.02 (*) <0.30 ng/mL   Comment:            Due to the release kinetics of  cTnI,      a negative result within the first hours     of the onset of symptoms does not rule out     myocardial infarction with certainty.     If myocardial infarction is still suspected,     repeat the test at appropriate intervals.     CRITICAL VALUE NOTED.  VALUE IS CONSISTENT WITH PREVIOUSLY REPORTED AND CALLED VALUE.    Imaging: Imaging results have been reviewed  Assessment/Plan:   1. Principal Problem: 2.   NSTEMI (non-ST elevated myocardial infarction) 3. Active Problems: 4.   CAD S/P percutaneous coronary angioplasty - Xience outline DES to mid RCA (2.75 mm x 33 mm --> 3.0 mm) 5.   Presence of drug coated stent in right coronary artery 6.   Time Spent Directly with Patient:  20 minutes  Length of Stay:  LOS: 2 days    POD #1 Inferior NSTEMI. Dom RCA Rx with DES. Left system OK. Nl LV fxn. Exam benign. Labs OK. Trop > 20. DAPT. Tx tele. Fast track. Home tomorrow. [facility].  Lorretta Harp 08/09/2013, 12:13 PM

## 2013-08-09 NOTE — Progress Notes (Signed)
CSW consulted that pt cannot afford his meds. CSW informed RNCM, who handles medication affordability. CSW will communicate with RNCM to determine if CSW needs arise. CSW signing off and will re-evaluate if needed.   Ky Barban, MSW, [facility] Clinical Social Worker [phone]

## 2013-08-09 NOTE — Progress Notes (Signed)
CARDIAC REHAB PHASE I   PRE:  Rate/Rhythm: 62 SR    BP: sitting 107/62    SaO2:   MODE:  Ambulation: 390 ft   POST:  Rate/Rhythm: 76 SR    BP: sitting 118/66     SaO2:   Tolerated well, no c/o except slight SOB when walking and talking. Ed completed. Pt is wanting to go back to work immediately.  Discussed how his heart is healing. Interested in [place] and will send referral to [facility]. [phone]  Josephina Shih Lutcher CES, ACSM 08/09/2013 12:08 PM

## 2013-08-10 DIAGNOSIS — I214 Non-ST elevation (NSTEMI) myocardial infarction: Secondary | ICD-10-CM

## 2013-08-10 DIAGNOSIS — E785 Hyperlipidemia, unspecified: Secondary | ICD-10-CM | POA: Diagnosis present

## 2013-08-10 LAB — TROPONIN I
Troponin I: 7.9 ng/mL (ref <0.30)
Troponin I: 4.6 ng/mL (ref <0.30)

## 2013-08-10 MED ORDER — ASPIRIN 81 MG PO TABS: 81.0000 mg | ORAL_TABLET | Freq: Every day | ORAL | Status: DC

## 2013-08-10 MED ORDER — NITROGLYCERIN 0.4 MG SL SUBL: 0.4000 mg | SUBLINGUAL_TABLET | SUBLINGUAL | Status: DC | PRN

## 2013-08-10 MED ORDER — TICAGRELOR 90 MG PO TABS: 90.0000 mg | ORAL_TABLET | Freq: Two times a day (BID) | ORAL | Status: DC

## 2013-08-10 MED ORDER — PRAVASTATIN SODIUM 40 MG PO TABS: 40.0000 mg | ORAL_TABLET | Freq: Every day | ORAL | Status: DC

## 2013-08-10 NOTE — Discharge Summary (Signed)
Patient seen and examined and is stable for discharge. Agree with d/c summary as outlined by Rosaria Ferries, PA-c

## 2013-08-10 NOTE — Discharge Summary (Signed)
CARDIOLOGY DISCHARGE SUMMARY   Patient ID: Albert Brown MRN: [id number] DOB/AGE: April 13, 1955 58 y.o.  Admit date: 08/07/2013 Discharge date: 08/10/2013  PCP: [name], MD Primary Cardiologist: Dr. Tamala Julian  Primary Discharge Diagnosis: NSTEMI (non-ST elevated myocardial infarction)  Secondary Discharge Diagnosis:    CAD S/P percutaneous coronary angioplasty - Xience outline DES to mid RCA (2.75 mm x 33 mm --> 3.0 mm)   Presence of drug coated stent in right coronary artery   Dyslipidemia   HIV  Procedures: Cardiac catheterization, coronary arteriogram, left ventriculogram, 2-D echocardiogram  Hospital Course: Albert Brown is a 58 y.o. male with no history of CAD. His HIV has been well-controlled. He had intermittent chest tightness, and saw Dr. Tamala Julian, who recommended a treadmill test and echocardiogram but these are not performed. On 06/13 he developed exertional chest pain and came to the [facility] long emergency room. He was given sublingual nitroglycerin but had a hypertensive BP response and started vomiting. Of note, he had taken Cialis within 24 hours. His symptoms improved with IV hydration. He was also given aspirin, loaded with Plavix, and started on IV heparin. He was transferred to [facility] because of ongoing chest pain, abnormal cardiac enzymes and hypotension. He was taken directly to the cath lab.  Cardiac catheterization results are below. He has severe catheter-induced spasm, so the radial artery was not used and he was cathed from the right femoral artery. He had minimal coronary artery disease in the LAD and circumflex. The culprit vessel was the RCA. This was treated with a drug-eluting stent reducing the stenosis to 0. His EF was preserved. He was initially loaded with Plavix but was felt to be a better candidate for Brilinta, so he was changed to [place]. Prior authorization was obtained for this medication.  He was started on aspirin, a statin and Brilinta.  These medicines do not conflict with his HIV medicines. His blood pressure was too low for a beta blocker or an ACE inhibitor. He was seen by cardiac rehabilitation and educated on MI restrictions, heart-healthy lifestyle modifications and exercise guidelines. A 2-D echocardiogram was performed, results are below. His EF is preserved with an inferior wall motion abnormality.  He continued to improve steadily. He was seen by care management and information on prescription prices and assistance was given.  On 06/16, he was seen by Dr. Radford Pax and all data were reviewed. He was otherwise doing well and no further inpatient workup is needed. He is considered stable for discharge, to followup up as an outpatient.  Labs:   Lab Results  Component Value Date   WBC 8.7 08/08/2013   HGB 13.1 08/08/2013   HCT 40.3 08/08/2013   MCV 96.4 08/08/2013   PLT 154 08/08/2013     Recent Labs Lab 08/08/13 0552  NA 140  K 4.5  CL 106  CO2 22  BUN 18  CREATININE 0.98  CALCIUM 9.0  PROT 6.5  BILITOT 0.5  ALKPHOS 69  ALT 35  AST 79*  GLUCOSE 112*    Recent Labs  08/07/13 2234 08/08/13 0552  08/09/13 2220 08/10/13 0529 08/10/13 0905  CKTOTAL 293* 988*  --   --   --   --   CKMB 33.9* 139.9*  --   --   --   --   TROPONINI  --  10.50*  < > 8.37* 7.90* 4.60*  < > = values in this interval not displayed.  Pro B Natriuretic peptide (BNP)  Date/Time Value  Ref Range Status  08/07/2013 10:34 PM 54.8  0 - 125 pg/mL Final    Recent Labs  08/08/13 0552  INR 1.79*      Radiology: Dg Chest Port 1 View 08/07/2013   CLINICAL DATA:  Generalized chest pain. Nausea and diaphoresis. History of smoking.  EXAM: PORTABLE CHEST - 1 VIEW  COMPARISON:  Chest radiograph performed 05/16/2009  FINDINGS: The lungs are well-aerated and clear. There is no evidence of focal opacification, pleural effusion or pneumothorax.  The cardiomediastinal silhouette is within normal limits. No acute osseous abnormalities are seen.   IMPRESSION: No acute cardiopulmonary process seen.   Electronically Signed   By: Garald Balding M.D.   On: 08/07/2013 23:54    Cardiac Cath: 08/08/2013 Left Ventriculography:  EF: ~60-65%  Wall Motion: mild basal to mid inferior hypokinesis. Coronary Anatomy:  Dominance: Right  Left Main: Large-caliber vessel that bifurcates into the LAD and nondominant Circumflex. Angiographically normal. There is a small, insignificant Ramus Intermedius. LAD: Large-caliber vessel that gives off a major proximal first diagonal branch (D1) that courses almost is a Ramus Intermedius, followed by a smaller D2 and D3. The LAD reaches down to wrap the apex. Minimal (10-20%) luminal irregularities are noted at the branch point of significant diagonal vessels.  D1: Large-caliber vessel, angiographically normal  D2: Small-caliber vessel, angiographically normal.  D3: Moderate caliber vessel, angiographically normal Left Circumflex: Large caliber vessel that essentially courses as a lateral OM branch with a very small AV groove branch and atrial branch. Minimal luminal irregularities.  RCA: Large-caliber, dominant vessel that has a early mid 70% stenosis followed by a tapering 100% thrombotic occlusion.  Post PCI angiography reveals a long segment of 50-60% stenosis followed by a 70% stenosis prior to the crux. The vessel then normalizes into a relatively normal distal RCA bifurcating into the Right Posterior AV Groove Branch (RPAV) and small caliber RPDA. There is 1 RPL that is major. After reviewing the initial angiography, the culprit lesion was thought to be 100% about occluded mid RCA. Preparation were made to proceed with PCI on this lesion.  Percutaneous Coronary Intervention:  Lesion: 100% mid RCA - reduced to 0%; TIMI 0 flow pre-, TIMI 3 flow post  Radial access guide catheters as described above were used, however the AR-1 which did seem to fit best was becoming more difficult to direct due to brachial artery  spasm, therefore the decision was made to abort radial access and convert to femoral artery access. The catheter was removed completely out of the body along with a wire. Additional verapamil was administered through the sheath and the sheath was removed with a TR band placed. Femoral access with a 6 French sheath as above: Guide: 6 Fr 3 DRC  Guidewire: Pro-water -- initially the wire would only go to what appeared to be the crux of the RCA, therefore the predilation balloon was loaded on the wire it was successfully advanced following initial balloon angioplasty into the distal RCA/RPL.  Predilation Balloon: Euphora 2.5 mm x 12 mm;  Multiple inflations 10-12 Atm x 20 Sec  Post angioplasty images revealed TIMI 3 flow distally with a long segment of disease in the mid RCA. Stent: Xience Alpine DES 2.75 mm x 33 mm;  Max inflation 16 Atm x 30 Sec - final diameter ~3.0 mm  Post-dilation Balloon: Crozier Euphora 3.0 mm x 20 mm;  16 Atm x 30 Sec X 2 inflations  Final Diameter: 3.0 mm Post deployment angiography in multiple views, with  and without guidewire in place revealed accurate stent deployment and lesion coverage. There was no evidence of dissection or perforation.  MEDICATIONS:  Anesthesia: Local Lidocaine 2 mL for radial; 20 mL for femoral  Sedation: 1 mg IV Versed, 25 mcg IV fentanyl ;  Premedication: 4000 units IV heparin  Omnipaque Contrast: 250 ml  Anticoagulation: Angiomax Bolus & drip  Anti-Platelet Agent: Brilinta 180 mg  Radial Cocktail: 5 mg Verapamil, 400 mcg NTG, 2 ml 2% Lidocaine in 10 ml NS;  2 additional doses of IA Verapamil (via catheter in axillary artery & via sheath) were administered for catheter induced brachial & radial spasm. Zofran 4 mg IV  500 mL NS bolus PATIENT DISPOSITION:  The patient was transferred to the PACU holding area in a hemodynamicaly stable, chest pain free condition.  The patient tolerated the procedure well, and there were no complications. EBL: < 20  ml  The patient was stable before, during, and after the procedure. POST-OPERATIVE DIAGNOSIS:  Severe single-vessel CAD with what appeared to be a subacute total occlusion of the mid RCA.  Successful long segmental PCI with a single Xience Alpine DES stent restoring TIMI-3 flow  Well-preserved LVEF with normal EDP PLAN OF CARE:  Admit to TCU per standard post radial and femoral cath care. Continue Angiomax for 2 hours post PCI.  Dual antiplatelet therapy for a minimum of 1 year.  Echocardiogram ordered,  Statin, beta blocker if rate tolerates, ACE inhibitor/ARB if blood pressure tolerates.  Avoid nitrates due to recent Cialis use  Case Management and Cardiac Rehabilitation consult Anticipate fast-track discharge. He will followup with Dr. Daneen Schick at the [facility] office  EKG: 09-Aug-2013 07:31:41   Normal sinus rhythm  Inferior infarct Vent. rate 69 BPM PR interval 124 ms QRS duration 86 ms QT/QTc 396/424 ms P-R-T axes 49 -7 -27  Echo: 08/08/2013 Conclusions Left ventricle: The cavity size was normal. Systolic function was normal. The estimated ejection fraction was in the range of 55% to 60%. Possible mild hypokinesis of the inferior myocardium. - Right ventricle: Systolic function was mildly to moderately reduced.   FOLLOW UP PLANS AND APPOINTMENTS Allergies  Allergen Reactions  . Codeine Anaphylaxis  . Bactrim [Sulfamethoxazole-Tmp Ds]     "Pt stated he died when taking this"  . Ciprofloxacin     REACTION: rash  . Dapsone     REACTION: severe-anaphylaxis  . Sulfamethoxazole-Trimethoprim     Anaphylaxis      Medication List         aspirin 81 MG tablet  Take 1 tablet (81 mg total) by mouth daily.     MULTIVITAMIN PO  Take 1 tablet by mouth daily.     nitroGLYCERIN 0.4 MG SL tablet  Commonly known as:  NITROSTAT  Place 1 tablet (0.4 mg total) under the tongue every 5 (five) minutes as needed for chest pain.     pantoprazole 40 MG  tablet  Commonly known as:  PROTONIX  Take 40 mg by mouth daily as needed (for acid reflux).     pravastatin 40 MG tablet  Commonly known as:  PRAVACHOL  Take 1 tablet (40 mg total) by mouth daily.     tadalafil 5 MG tablet  Commonly known as:  CIALIS  Take 5 mg by mouth daily as needed for erectile dysfunction.     testosterone 50 MG/5GM (1%) Gel  Commonly known as:  ANDROGEL  Place 5 g onto the skin daily.     ticagrelor  90 MG Tabs tablet  Commonly known as:  BRILINTA  Take 1 tablet (90 mg total) by mouth 2 (two) times daily.     traZODone 50 MG tablet  Commonly known as:  DESYREL  Take 50 mg by mouth daily as needed for sleep.     TRIUMEQ 600-50-300 MG Tabs  Generic drug:  Abacavir-Dolutegravir-Lamivud  Take 1 tablet by mouth daily.        Discharge Instructions   Amb Referral to Cardiac Rehabilitation    Complete by:  As directed      Diet - low sodium heart healthy    Complete by:  As directed      Increase activity slowly    Complete by:  As directed           Follow-up Information   Follow up with Lyda Jester, PA-C On 08/20/2013. (at 11:30 am)    Specialty:  Cardiology   Contact information:   [address] [place] [address] [phone]       BRING ALL MEDICATIONS WITH YOU TO FOLLOW UP APPOINTMENTS  Time spent with patient to include physician time: 45 min Signed: Rosaria Ferries, PA-C 08/10/2013, 11:52 AM Co-Sign MD

## 2013-08-10 NOTE — Plan of Care (Signed)
Problem: Phase III Progression Outcomes Goal: VS Stable with increased activity Outcome: Progressing Pt sob on exertion. No cp though. Goal: Vascular site scale level 0 - I Vascular Site Scale Level 0: No bruising/bleeding/hematoma Level I (Mild): Bruising/Ecchymosis, minimal bleeding/ooozing, palpable hematoma < 3 cm Level II (Moderate): Bleeding not affecting hemodynamic parameters, pseudoaneurysm, palpable hematoma > 3 cm Level III (Severe) Bleeding which affects hemodynamic parameters or retroperitoneal hemorrhage  Outcome: Progressing Level 1

## 2013-08-10 NOTE — Progress Notes (Signed)
Reviewed discharge instructions with patient and family, they stated their understanding.  Return to work note given to patient.  Patient able to recite how to use SL NTG tablets and when to call EMS.  Discharged home via wheelchair with family.  Albert Brown

## 2013-08-10 NOTE — Progress Notes (Signed)
Patient Name: Albert Brown Date of Encounter: 08/10/2013  Principal Problem:   NSTEMI (non-ST elevated myocardial infarction) Active Problems:   CAD S/P percutaneous coronary angioplasty - Xience outline DES to mid RCA (2.75 mm x 33 mm --> 3.0 mm)   Presence of drug coated stent in right coronary artery    Patient Profile: 58 yo male w/ hx HIV, GERD, was admitted 06/13 /w NSTEMI  SUBJECTIVE: Still has some SOB w/ exertion and talking in long sentences. Had this PTA.  OBJECTIVE Filed Vitals:   08/09/13 1128 08/09/13 1555 08/09/13 2149 08/10/13 0430  BP: 118/66 115/74 105/68 98/57  Pulse: 62 96 70 70  Temp: 97.9 F (36.6 C) 98.1 F (36.7 C) 98.1 F (36.7 C) 97.6 F (36.4 C)  TempSrc: Oral Oral Oral Oral  Resp:  14 18 18   Height:      Weight:    187 lb 6.3 oz (85 kg)  SpO2: 97% 96% 100% 97%    Intake/Output Summary (Last 24 hours) at 08/10/13 0925 Last data filed at 08/09/13 2154  Gross per 24 hour  Intake   1233 ml  Output    550 ml  Net    683 ml   Filed Weights   08/07/13 2235 08/10/13 0430  Weight: 190 lb (86.183 kg) 187 lb 6.3 oz (85 kg)    PHYSICAL EXAM General: Well developed, well nourished, male in no acute distress. Head: Normocephalic, atraumatic.  Neck: Supple without bruits, JVD not elevated Lungs:  Resp regular and unlabored, CTA. Heart: RRR, S1, S2, no S3, S4, or murmur; no rub. Abdomen: Soft, non-tender, non-distended, BS + x 4.  Extremities: No clubbing, cyanosis, no edema.  Neuro: Alert and oriented X 3. Moves all extremities spontaneously. Psych: Normal affect.  LABS: CBC: Recent Labs  08/07/13 2234 08/08/13 0552  WBC 10.7* 8.7  NEUTROABS  --  6.0  HGB 15.2 13.1  HCT 45.2 40.3  MCV 96.0 96.4  PLT 179 154   INR: Recent Labs  08/08/13 0552  INR 4.43*   Basic Metabolic Panel: Recent Labs  08/07/13 2234 08/08/13 0552  NA 142 140  K 4.2 4.5  CL 104 106  CO2 24 22  GLUCOSE 130* 112*  BUN 21 18  CREATININE 1.19  0.98  CALCIUM 9.8 9.0  MG  --  2.1   Liver Function Tests: Recent Labs  08/08/13 0552  AST 79*  ALT 35  ALKPHOS 69  BILITOT 0.5  PROT 6.5  ALBUMIN 3.6   Cardiac Enzymes: Recent Labs  08/07/13 2234 08/08/13 0552  08/09/13 1100 08/09/13 2220 08/10/13 0529  CKTOTAL 293* 988*  --   --   --   --   CKMB 33.9* 139.9*  --   --   --   --   TROPONINI  --  10.50*  < > 7.02* 8.37* 7.90*  < > = values in this interval not displayed.  Recent Labs  08/07/13 2242  TROPIPOC 0.75*   BNP: Pro B Natriuretic peptide (BNP)  Date/Time Value Ref Range Status  08/07/2013 10:34 PM 54.8  0 - 125 pg/mL Final   Thyroid Function Tests: Recent Labs  08/08/13 0552  TSH 3.620   TELE:   SR, no sig ectopy  Radiology/Studies: Dg Chest Port 1 View 08/07/2013   CLINICAL DATA:  Generalized chest pain. Nausea and diaphoresis. History of smoking.  EXAM: PORTABLE CHEST - 1 VIEW  COMPARISON:  Chest radiograph performed 05/16/2009  FINDINGS: The lungs  are well-aerated and clear. There is no evidence of focal opacification, pleural effusion or pneumothorax.  The cardiomediastinal silhouette is within normal limits. No acute osseous abnormalities are seen.  IMPRESSION: No acute cardiopulmonary process seen.   Electronically Signed   By: Garald Balding M.D.   On: 08/07/2013 23:54    Current Medications:  . abacavir  600 mg Oral Daily  . aspirin EC  81 mg Oral Daily  . dolutegravir  50 mg Oral Daily  . lamiVUDine  300 mg Oral Daily  . pravastatin  40 mg Oral Daily  . sodium chloride  3 mL Intravenous Q12H  . ticagrelor  90 mg Oral BID      ASSESSMENT AND PLAN: Principal Problem:   NSTEMI (non-ST elevated myocardial infarction) - s/p DES RCA, EF OK. No other sig dz. On ASA, ticagrelor, statin. No BB w/ BP borderline, 90s at times.  Active Problems:   CAD S/P percutaneous coronary angioplasty - Xience outline DES to mid RCA (2.75 mm x 33 mm --> 3.0 mm)   Presence of drug coated stent in right  coronary artery - see above    HIV - continue rx    Financial concerns - $4 meds when possible, has Brilinta card/packet, will change to lovastatin at d/c. MD advise when he can return to work, can have light duty, no problem.      Jonetta Speak , PA-C 9:25 AM 08/10/2013

## 2013-08-10 NOTE — Progress Notes (Signed)
Patient seen and examined and agree with note as outlined by Rosaria Ferries, PA-C.  Stable post NSTEMI with DES to RCA now on DAPT and statin.  No BB secondary to borderline low BP.  Return to work once seen back by Dr. Ellyn Hack in 2 weeks.

## 2013-08-10 NOTE — Discharge Instructions (Signed)
PLEASE REMEMBER TO BRING ALL OF YOUR MEDICATIONS TO EACH OF YOUR FOLLOW-UP OFFICE VISITS.  PLEASE ATTEND ALL SCHEDULED FOLLOW-UP APPOINTMENTS.   Activity: Increase activity slowly as tolerated. You may shower, but no soaking baths (or swimming) for 1 week. No driving for 1 week. No lifting over 5 lbs for 2 weeks. No sexual activity for 1 week.   You May Return to Work: in 3 weeks (if applicable)  Wound Care: You may wash cath site gently with soap and water. Keep cath site clean and dry. If you notice pain, swelling, bleeding or pus at your cath site, please call [phone].    Cardiac Cath Site Care Refer to this sheet in the next few weeks. These instructions provide you with information on caring for yourself after your procedure. Your caregiver may also give you more specific instructions. Your treatment has been planned according to current medical practices, but problems sometimes occur. Call your caregiver if you have any problems or questions after your procedure. HOME CARE INSTRUCTIONS  You may shower 24 hours after the procedure. Remove the bandage (dressing) and gently wash the site with plain soap and water. Gently pat the site dry.   Do not apply powder or lotion to the site.   Do not sit in a bathtub, swimming pool, or whirlpool for 5 to 7 days.   No bending, squatting, or lifting anything over 10 pounds (4.5 kg) as directed by your caregiver.   Inspect the site at least twice daily.   Do not drive home if you are discharged the same day of the procedure. Have someone else drive you.   You may drive 24 hours after the procedure unless otherwise instructed by your caregiver.  What to expect:  Any bruising will usually fade within 1 to 2 weeks.   Blood that collects in the tissue (hematoma) may be painful to the touch. It should usually decrease in size and tenderness within 1 to 2 weeks.  SEEK IMMEDIATE MEDICAL CARE IF:  You have unusual pain at the site or down the  affected limb.   You have redness, warmth, swelling, or pain at the site.   You have drainage (other than a small amount of blood on the dressing).   You have chills.   You have a fever or persistent symptoms for more than 72 hours.   You have a fever and your symptoms suddenly get worse.   Your leg becomes pale, cool, tingly, or numb.   You have heavy bleeding from the site. Hold pressure on the site.  Document Released: 03/16/2010 Document Revised: 01/31/2011 Document Reviewed:    Coronary Artery Disease, Risk Factors Research has shown that the risk of developing coronary artery disease (CAD) and having a heart attack increases with each factor you have. RISK FACTORS YOU CANNOT CHANGE  Your age. Your risk goes up as you get older. Most heart attacks happen to people over the age of 85.  Gender. Men have a greater risk of heart attack than women, and they have attacks earlier in life. However, women are more likely to die from a heart attack.  Heredity. Children of parents with heart disease are more likely to develop it themselves.  Race. African Americans and other ethnic groups have a higher risk, possibly because of high blood pressure, a tendency toward obesity, and diabetes.  Your family. Most people with a strong family history of heart disease have one or more other risk factors. RISK FACTORS YOU CAN  CHANGE  Exposure to tobacco smoke. Even secondhand smoke greatly increases the risk for heart disease.  High blood cholesterol may be lowered with changes in diet, activity, and medicines.  High blood pressure makes the heart work harder. This causes the heart muscles to become thick and, eventually, weaker. It also increases your risk of stroke, heart attack, and kidney or heart failure.  Physical inactivity is a risk factor for CAD. Regular physical activity helps prevent heart and blood vessel disease. Exercise helps control blood cholesterol, diabetes, obesity, and it  may help lower blood pressure in some people.  Excess body fat, especially belly fat, increases the risk of heart disease and stroke even if there are no other risk factors. Excess weight increases the heart's workload and raises blood pressure and blood cholesterol.  Diabetes seriously increases your risk of developing CAD. If you have diabetes, you should work with your caregiver to manage it and control other risk factors. OTHER RISK FACTORS FOR CAD  How you respond to stress.  Drinking too much alcohol may raise blood pressure, cause heart failure, and lead to stroke.  Total cholesterol greater than 200 milligrams.  HDL (good) cholesterol less than 40 milligrams. HDL helps keep cholesterol from building up in the walls of the arteries. PREVENTING CAD  Maintain a healthy weight.  Exercise or do physical activity.  Eat a heart-healthy diet low in fat and salt and high in fiber.  Control your blood pressure to keep it below 120 over 80.  Keep your cholesterol at a level that lowers your risk.  Manage diabetes if you have it.  Stop smoking.  Learn how to manage stress. HEART SMART SUBSTITUTIONS  Instead of whole or 2% milk and cream, use skim milk.  Instead of fried foods, eat baked, steamed, boiled, broiled, or microwaved foods.  Instead of lard, butter, palm and coconut oils, cook with unsaturated vegetable oils, such as corn, olive, canola, safflower, sesame, soybean, sunflower, or peanut.  Instead of fatty cuts of meat, eat lean cuts of meat or cut off the fatty parts.  Instead of 1 whole egg in recipes, use 2 egg whites.  Instead of sauces, butter, and salt, season vegetables with herbs and spices.  Instead of regular hard and processed cheeses, eat low-fat, low-sodium cheeses.  Instead of salted potato chips, choose low-fat, unsalted tortilla and potato chips and unsalted pretzels and popcorn.  Instead of sour cream and mayonnaise, use plain low-fat yogurt,  low-fat cottage cheese, or low-fat or "light" sour cream. FOR MORE INFORMATION  National Heart Lung and Blood Institute: http://www.ball-ray.net/ American Heart Association: weddingcandid.com Document Released: 05/04/2003 Document Revised: 05/06/2011 Document Reviewed: 04/29/2007 [employer] Patient Information 2014 [place], [place].  Coronary Artery Disease Coronary artery disease (CAD) is a process in which the heart (coronary) arteries narrow or become blocked from the development of atherosclerosis. Atherosclerosis is a disease in which plaque builds up on the inside of the heart arteries (coronary arteries). Plaque is made up of fats (lipids), cholesterol, calcium, and fibrous tissue. CAD can lead to a heart attack (myocardial infarction, MI). An MI can lead to heart failure, cardiogenic shock, or sudden cardiac death. CAD can cause an MI through:  Plaque buildup that can severely narrow or block the coronary arteries and diminish blood flow.  Plaque that can become unstable and "rupture." Unstable plaque that ruptures within a coronary artery can form a clot and cause a sudden (acute) blockage. RISK FACTORS Many risk factors contribute to the development of CAD.  These include:  High cholesterol (dyslipidemia) levels.  High blood pressure (hypertension).  Smoking.  Diabetes.  Age.  Gender. Men can develop CAD earlier in life than women.  Family history.  Inactivity or lack of regular physical or aerobic exercise.  A diet high in saturated fats.  Chronic kidney disease. SYMPTOMS  When a coronary artery is narrowed or blocked, an MI can occur. MI symptoms can include:  Chest pain (agina). Angina can occur by itself or it can also occur with pain in the neck, arm, jaw, or in the upper, middle back (mid-scapular pain).  Profuse sweating (diaphoresis) without physical activity or movement.  Shortness of breath (dyspnea).  Irregular heartbeats (palpitations)  that feel like your heart is skipping beats or is beating very fast.  Nausea.  Epigastric pain. Epigastric pain may occur as "heartburn."  Tiredness (malaise). This can especially be present in the elderly. Women can have different (atypical) symptoms other than classic angina.  DIAGNOSIS  The diagnosis of CAD may include:  An electrocardiography (ECG). An ECG does not diagnose CAD, but it is usefull in the detection of a sudden (acute) MI or as a marker for a previous MI. Depending on which heart (coronary) artery may be blocked, an ECG may not pick up an MI pattern.  Exercise stress test. A stress test can be performed at rest for people who are unable to do an exercise stress test. A stress test will only be abnormal if one or more of the large coronary arteries is significantly blocked.  Blood tests. Tests may include samples to detect heart muscle damage (such as troponin levels). Other tests may include cholesterol checks and an inflammation test (high-sensitivity C-reactive protein, hs-CRP).  Coronary angiography.  Screening people who have peripheral vascular disease (PAD). These people often times have CAD. TREATMENT  The treatment of CAD includes the following:  Lifestyle changes such as:  Following a heart-healthy diet. A registered dietitian can you help educate you on healthy food options and changes.  Quiting smoking.  Following an exercise program approved by your caregiver.  Maintaining a healthy weight. Lose weight as approved by your caregiver.  Medicines to help control your blood pressure, cholesterol level, angina, and blood clotting. Medicines may include beta-blockers, ACE inhibitors, statins, nitrates, and anti-platelet medicines.  If you have a heart stent and are taking anti-platelet medicine, it is important to not suddenly stop taking this medicine. Suddenly stopping anti-platelet medicine can result in an MI. Talk with your caregiver before stopping  medicine or if you cannot afford your medicine.  If the coronary arteries are significantly blocked, surgery may be needed. This can include:  Percutaneous coronary intervention (PCI) with or without stent placement.  Coronary artery bypass graft surgery (CABG). SEEK IMMEDIATE MEDICAL CARE IF:   You develop MI symptoms. This is a medical emergency. Get help at once. Call your local emergency service (911 in the [place] immediately. Do not drive yourself to the clinic or hospital. MI symptoms can include:  Angina or pain that occurs in the neck, arm, jaw, or in the upper middle back.  Profuse sweating without cause.  Shortness of breath or difficulty breathing without cause.  Unexplained nausea or epigastric pain that feels like heartburn. Document Released: 05/06/2011 Document Reviewed: 05/06/2011 [facility] Patient Information 2014 [place].  Myocardial Infarction A myocardial infarction (MI) is also called a heart attack. It causes damage to the heart that cannot be fixed. An MI often happens when a blood clot or other  blockage cuts blood flow to the heart. When this happens, certain areas of the heart begin to die. This is an emergency. HOME CARE  Take medicine as told by your doctor.  Change certain behaviors as told by your doctor. This may include:  Quitting smoking.  Being active.  Keeping a healthy weight.  Eating a heart-healthy diet. Ask your doctor for help with this diet.  Keeping your diabetes under control.  Lessening stress.  Limiting how much alcohol you drink. GET HELP RIGHT AWAY IF:  You have crushing or pressure-like chest pain that spreads to the arms, back, neck, or jaw. Call your local emergency services (911 in [place] Do not drive yourself to the hospital.  You have severe chest pain.  You have shortness of breath during rest, sleep, or with activity.  You have sudden sweating or clammy skin.  You feel sick to your stomach (nauseous) and  throw up (vomit).  You suddenly get lightheaded or dizzy.  You feel your heart beating fast or skipping beats. MAKE SURE YOU:   Understand these instructions.  Will watch your condition.  Will get help right away if you are not doing well or get worse. Document Released: 08/13/2011 Document Reviewed: 08/13/2011 [facility] Patient Information 2014 [place], [place].

## 2013-08-12 ENCOUNTER — Encounter: Payer: Self-pay | Admitting: *Deleted

## 2013-08-13 ENCOUNTER — Telehealth: Payer: Self-pay | Admitting: *Deleted

## 2013-08-13 NOTE — Telephone Encounter (Signed)
Per [facility], testosterone gel has not been denied yet, so the letter Dr. Johnnye Sima wrote can not be submitted. Patient can not come for 2 consecutive morning lab visits due to work schedule. Insurance is faxing prior auth and once it is filled out and denied, then the letter can be submitted. Myrtis Hopping

## 2013-08-16 ENCOUNTER — Telehealth: Payer: Self-pay | Admitting: *Deleted

## 2013-08-16 NOTE — Telephone Encounter (Signed)
Brilinta approved through [employer] dates 08/10/13---08/10/2016

## 2013-08-17 ENCOUNTER — Telehealth: Payer: Self-pay | Admitting: *Deleted

## 2013-08-17 NOTE — Telephone Encounter (Signed)
Patient's insurance [employer] has denied his androgel, because patient has not done the requested lab work due to work. Letter on his behalf signed by Dr. Johnnye Sima submitted to insurance at fax # [phone]. Myrtis Hopping

## 2013-08-20 ENCOUNTER — Ambulatory Visit: Payer: No Typology Code available for payment source | Admitting: Cardiology

## 2013-08-24 ENCOUNTER — Ambulatory Visit (INDEPENDENT_AMBULATORY_CARE_PROVIDER_SITE_OTHER): Payer: No Typology Code available for payment source | Admitting: Cardiology

## 2013-08-24 ENCOUNTER — Ambulatory Visit: Payer: No Typology Code available for payment source | Admitting: Cardiology

## 2013-08-24 ENCOUNTER — Encounter: Payer: Self-pay | Admitting: Cardiology

## 2013-08-24 VITALS — BP 122/86 | HR 71 | Ht 68.0 in | Wt 186.2 lb

## 2013-08-24 DIAGNOSIS — Z9861 Coronary angioplasty status: Secondary | ICD-10-CM

## 2013-08-24 DIAGNOSIS — I2510 Atherosclerotic heart disease of native coronary artery without angina pectoris: Secondary | ICD-10-CM

## 2013-08-24 MED ORDER — METOPROLOL TARTRATE 25 MG PO TABS: 12.5000 mg | ORAL_TABLET | Freq: Two times a day (BID) | ORAL | Status: DC

## 2013-08-24 NOTE — Patient Instructions (Signed)
Your physician recommends that you schedule a follow-up appointment in: 4-6 weeks with dr Ellyn Hack  Your physician has recommended you make the following change in your medication: Start Metoprolol 12.5 mg twice daily

## 2013-08-24 NOTE — Progress Notes (Signed)
Patient ID: Albert Brown, male   DOB: [birth year], 57 y.o.   MRN: [id number]    08/24/2013 Albert Brown   Feb 28, 1955  [id number]  Primary Physicia Albert Rumpf, MD Primary Cardiologist: Dr. Ellyn Brown  HPI:  The patient is a 58 year old Caucasian male with history of treated HIV and no prior cardiac history, who presented to [facility] on 08/07/2013 with complaint of exertional chest discomfort. He was given sublingual nitroglycerin glycerin and had a hypotensive blood pressure responses for vomiting. Of note, he had taken Cialis within 24 hours. His symptoms improved with IV hydration. He was also given aspirin, little Plavix and started on IV heparin. He was transferred to [facility] because of ongoing chest pain. elevated enzymes and hypotension. He was taken directly to the cath lab for cardiac catheterization. The procedure was performed by Dr. Ellyn Brown. He apparently had severe catheter-induced spasm, so the radial artery was not used and he was cath from the right femoral artery. He had minimal coronary artery disease in the LAD and circumflex. The culprit vessel was the RCA. He was found to have a somewhat acute total occlusion of the mid RCA. This successfully treated with PCI plus stenting utilizing a drug-eluting stent. He had well-preserved left ventricular systolic function with an estimated ejection fraction of 60-65%. There was mild basilar to mid inferior hypokinesis. He was placed on dual antiplatelet therapy with aspirin plus Brilinta. He was also placed on statin therapy. He was not initiated on a beta blocker or ACE inhibitor, due to borderline hypotension. His hospital course was otherwise uncomplicated. He was discharged home on 08/10/2013.  He presents to clinic today for posthospital followup. He states that he has done remarkably well since discharge. He denies any recurrent chest pain, no shortness of breath, dizziness, or lightheadedness,  syncope/near-syncope. He reports that he has been fully compliant with his medications including going to put therapy. He has not required any use of sublingual nitroglycerin.   Current Outpatient Prescriptions  Medication Sig Dispense Refill  . Abacavir-Dolutegravir-Lamivud (TRIUMEQ) 600-50-300 MG TABS Take 1 tablet by mouth daily.      Marland Kitchen aspirin 81 MG tablet Take 1 tablet (81 mg total) by mouth daily.      . Multiple Vitamin (MULTI VITAMIN MENS PO) Take 1 tablet by mouth daily.      . nitroGLYCERIN (NITROSTAT) 0.4 MG SL tablet Place 1 tablet (0.4 mg total) under the tongue every 5 (five) minutes as needed for chest pain.  25 tablet  3  . pantoprazole (PROTONIX) 40 MG tablet Take 40 mg by mouth daily as needed (for acid reflux).      . pravastatin (PRAVACHOL) 40 MG tablet Take 1 tablet (40 mg total) by mouth daily.  30 tablet  11  . tadalafil (CIALIS) 5 MG tablet Take 5 mg by mouth daily as needed for erectile dysfunction.      Marland Kitchen testosterone (ANDROGEL) 50 MG/5GM (1%) GEL Place 5 g onto the skin daily.      . ticagrelor (BRILINTA) 90 MG TABS tablet Take 1 tablet (90 mg total) by mouth 2 (two) times daily.  60 tablet  11  . traZODone (DESYREL) 50 MG tablet Take 50 mg by mouth daily as needed for sleep.       No current facility-administered medications for this visit.    Allergies  Allergen Reactions  . Codeine Anaphylaxis  . Bactrim [Sulfamethoxazole-Tmp Ds]     "Pt stated he died when taking this"  .  Ciprofloxacin     REACTION: rash  . Dapsone     REACTION: severe-anaphylaxis  . Sulfamethoxazole-Trimethoprim     Anaphylaxis     History   Social History  . Marital Status: Single    Spouse Name: N/A    Number of Children: N/A  . Years of Education: N/A   Occupational History  . Not on file.   Social History Main Topics  . Smoking status: Former Smoker    Types: Cigarettes    Quit date: 03/02/2007  . Smokeless tobacco: Never Used     Comment: pt. no longer smokes  .  Alcohol Use: Yes     Comment: weekends  . Drug Use: No  . Sexual Activity: No     Comment: pt. refused condoms   Other Topics Concern  . Not on file   Social History Narrative  . No narrative on file     Review of Systems: General: negative for chills, fever, night sweats or weight changes.  Cardiovascular: negative for chest pain, dyspnea on exertion, edema, orthopnea, palpitations, paroxysmal nocturnal dyspnea or shortness of breath Dermatological: negative for rash Respiratory: negative for cough or wheezing Urologic: negative for hematuria Abdominal: negative for nausea, vomiting, diarrhea, bright red blood per rectum, melena, or hematemesis Neurologic: negative for visual changes, syncope, or dizziness All other systems reviewed and are otherwise negative except as noted above.    Blood pressure 122/86, pulse 71, height 5\' 8"  (1.727 m), weight 186 lb 3.2 oz (84.46 kg).  General appearance: alert, cooperative and no distress Neck: no carotid bruit and no JVD Lungs: clear to auscultation bilaterally Heart: regular rate and rhythm, S1, S2 normal, no murmur, click, rub or gallop Extremities: no LEE Pulses: 2+ and symmetric Skin: warm and dry Neurologic: Grossly normal  EKG normal sinus rhythm. Heart rate 71 beats per minute. No ischemic abnormalities.  ASSESSMENT AND PLAN:   1. CAD: Status post non-ST elevation MI, resulting in PCI plus drug-eluting stent to the mid RCA. Normal left ventricular systolic function with EF of 60-65%. He denies any recurrent anginal type symptoms. Continue dual antiplatelet therapy with aspirin plus Brilinta, for a minimum of one year. Continue statin therapy. His blood pressure is more stable today compared to his blood pressure during admission. His blood pressure today is 122/86. We will try initiating him on a low-dose beta blocker. We'll prescribe 12.5 mg Lopressor twice a day. He is been instructed to monitor his blood pressure at home  closely. He was asked to notify our office if he has systolic pressures less than 90 and/or becomes symptomatic.  2. Hypotension: Resolved. Patient denies any recent symptoms of hypotension. Blood pressure today is 122/86. We'll initiate low-dose beta blocker. Patient instructed to monitor blood pressure closely, as outlined above. We will avoid long acting nitrates, given his use of Cialis. We also reviewed that he should not take sublingual nitroglycerin and Cialis within 24 hours of each other.  PLAN   The patient is doing well from a cardiac standpoint. He has been instructed to continue dual antiplatelet therapy, uninterrupted for a minimum of one year. He was provided samples of Brilinta from the office today. A srescription was sent to his pharmacy for low-dose Lopressor. He is instructed to followup with Dr. Ellyn Brown for repeat reassessment in 4-6 weeks, or sooner if needed.  SIMMONS, BRITTAINYPA-C 08/24/2013 5:04 PM

## 2013-08-25 ENCOUNTER — Encounter: Payer: Self-pay | Admitting: *Deleted

## 2013-08-25 ENCOUNTER — Telehealth: Payer: Self-pay | Admitting: *Deleted

## 2013-08-25 NOTE — Telephone Encounter (Signed)
Saw [place], [place] 6/30 - needed return to work note. Note composed, signed, and given to patient.

## 2013-09-06 ENCOUNTER — Telehealth: Payer: Self-pay | Admitting: *Deleted

## 2013-09-06 ENCOUNTER — Other Ambulatory Visit: Payer: Self-pay | Admitting: Infectious Diseases

## 2013-09-06 ENCOUNTER — Other Ambulatory Visit: Payer: No Typology Code available for payment source

## 2013-09-06 DIAGNOSIS — Z79899 Other long term (current) drug therapy: Secondary | ICD-10-CM

## 2013-09-06 DIAGNOSIS — Z113 Encounter for screening for infections with a predominantly sexual mode of transmission: Secondary | ICD-10-CM

## 2013-09-06 DIAGNOSIS — R7989 Other specified abnormal findings of blood chemistry: Secondary | ICD-10-CM

## 2013-09-06 DIAGNOSIS — B20 Human immunodeficiency virus [HIV] disease: Secondary | ICD-10-CM

## 2013-09-06 LAB — CBC WITH DIFFERENTIAL/PLATELET
BASOS ABS: 0 10*3/uL (ref 0.0–0.1)
Basophils Relative: 0 % (ref 0–1)
EOS PCT: 7 % — AB (ref 0–5)
Eosinophils Absolute: 0.4 10*3/uL (ref 0.0–0.7)
HCT: 41.5 % (ref 39.0–52.0)
Hemoglobin: 14.4 g/dL (ref 13.0–17.0)
LYMPHS PCT: 33 % (ref 12–46)
Lymphs Abs: 1.8 10*3/uL (ref 0.7–4.0)
MCH: 31.4 pg (ref 26.0–34.0)
MCHC: 34.7 g/dL (ref 30.0–36.0)
MCV: 90.6 fL (ref 78.0–100.0)
MONO ABS: 0.5 10*3/uL (ref 0.1–1.0)
Monocytes Relative: 9 % (ref 3–12)
Neutro Abs: 2.9 10*3/uL (ref 1.7–7.7)
Neutrophils Relative %: 51 % (ref 43–77)
Platelets: 158 10*3/uL (ref 150–400)
RBC: 4.58 MIL/uL (ref 4.22–5.81)
RDW: 14 % (ref 11.5–15.5)
WBC: 5.6 10*3/uL (ref 4.0–10.5)

## 2013-09-06 LAB — COMPLETE METABOLIC PANEL WITH GFR
ALK PHOS: 79 U/L (ref 39–117)
ALT: 33 U/L (ref 0–53)
AST: 20 U/L (ref 0–37)
Albumin: 4.5 g/dL (ref 3.5–5.2)
BUN: 21 mg/dL (ref 6–23)
CO2: 28 mEq/L (ref 19–32)
Calcium: 9.4 mg/dL (ref 8.4–10.5)
Chloride: 105 mEq/L (ref 96–112)
Creat: 1.63 mg/dL — ABNORMAL HIGH (ref 0.50–1.35)
GFR, Est African American: 54 mL/min — ABNORMAL LOW
GFR, Est Non African American: 47 mL/min — ABNORMAL LOW
Glucose, Bld: 83 mg/dL (ref 70–99)
Potassium: 4 mEq/L (ref 3.5–5.3)
SODIUM: 140 meq/L (ref 135–145)
Total Bilirubin: 0.5 mg/dL (ref 0.2–1.2)
Total Protein: 6.9 g/dL (ref 6.0–8.3)

## 2013-09-06 LAB — LIPID PANEL
Cholesterol: 138 mg/dL (ref 0–200)
HDL: 44 mg/dL (ref >39)
LDL CALC: 65 mg/dL (ref 0–99)
Total CHOL/HDL Ratio: 3.1 Ratio
Triglycerides: 143 mg/dL (ref <150)
VLDL: 29 mg/dL (ref 0–40)

## 2013-09-06 NOTE — Telephone Encounter (Signed)
Patient came in for labs today and requested a testosterone and psa be added. He said he has a history of prostate cancer in his family and he has been unable to get his testosterone med because of no recent lab. Labs added [employer]

## 2013-09-07 LAB — TESTOSTERONE: Testosterone: 187 ng/dL — ABNORMAL LOW (ref 300–890)

## 2013-09-07 LAB — PSA: PSA: 2.46 ng/mL (ref <=4.00)

## 2013-09-07 LAB — RPR

## 2013-09-08 LAB — T-HELPER CELL (CD4) - (RCID CLINIC ONLY)
CD4 % Helper T Cell: 38 % (ref 33–55)
CD4 T Cell Abs: 710 /uL (ref 400–2700)

## 2013-09-08 LAB — HIV-1 RNA QUANT-NO REFLEX-BLD
HIV 1 RNA Quant: <20 copies/mL (ref <20)
HIV-1 RNA Quant, Log: <1.3 {Log} (ref <1.30)

## 2013-09-16 ENCOUNTER — Telehealth: Payer: Self-pay | Admitting: *Deleted

## 2013-09-16 ENCOUNTER — Ambulatory Visit: Payer: No Typology Code available for payment source

## 2013-09-16 DIAGNOSIS — F4320 Adjustment disorder, unspecified: Secondary | ICD-10-CM

## 2013-09-16 NOTE — Progress Notes (Signed)
Albert Brown reported today about his heart attack that occurred last month while on a trail hike.  He talked about how lucky he was because he was alone when it happened and it could have been worse.  He also talked about his ex-boyfriend/former roommate, who keeps coming around despite Albert Brown's attempts to be rid of him.  He said he has told him to just leave him alone, but he will stop by anyway and ask for favors.  Overall, he said he is feeling pretty good and glad that he survived the heart attack.  Plan to meet in one week.

## 2013-09-16 NOTE — Telephone Encounter (Signed)
Patient came in today, concerned that the first available appointment with Dr. Johnnye Sima is too far out (end of August).  He recently was hospitalized for a heart attack (08/07/13), has questions regarding testosterone rx, any medication changes.  Patient needs a late afternoon appointment, or to be scheduled far enough in advance to ask off at work (at least 3 days in advance). Please advise where you would like to see the patient. Landis Gandy, RN

## 2013-09-17 NOTE — Telephone Encounter (Signed)
August 17,18, 19. Otherwise he can f/u with his PCP

## 2013-09-20 ENCOUNTER — Ambulatory Visit: Payer: Self-pay | Admitting: Infectious Diseases

## 2013-09-20 NOTE — Telephone Encounter (Signed)
Patient stated that those dates weren't far off from what he was previously offered.  Pt took appointment 8/24 at 4:15.   Pt states that he felt chastised by this RN for not letting us/Dr. Johnnye Sima know earlier that he had a MI last month. Pt stated he assumed that Dr. Johnnye Sima would know that he was an in-patient at [facility], so he didn't feel the need to call.    I apologized for the manner with which I spoke to the patient, stating that I did not mean to make him feel that way.  Patient stated that he felt confused, as he has been told in the past that we "only care about his HIV and he shouldn't come to [place] for anything that is not related to it, so I didn't think you would want to know."  He doesn't see the need to engage an PCP and it is "not at the top of his priority list right now." He stated that he feels that he "has a heart doctor and he has a HIV doctor, his heart is fine, his HIV is fine, and that is all he needs." He went on to say that his insurance company has sent him a list of providers in his network, that he "might look at that sometime, but not soon."  I again apologized for my manner, explained the benefits of setting up care with a PCP, especially as we may not be able to work him in for acute needs.  Pt stated he would think about it. Landis Gandy, RN

## 2013-09-21 NOTE — Telephone Encounter (Signed)
Thanks michelle

## 2013-09-23 ENCOUNTER — Ambulatory Visit: Payer: No Typology Code available for payment source

## 2013-09-30 ENCOUNTER — Ambulatory Visit: Payer: No Typology Code available for payment source

## 2013-10-05 ENCOUNTER — Telehealth: Payer: Self-pay | Admitting: *Deleted

## 2013-10-05 NOTE — Telephone Encounter (Signed)
Called the [facility] and got Albert Brown's account re-activated.  He can get his medication.

## 2013-10-06 ENCOUNTER — Encounter (HOSPITAL_COMMUNITY): Payer: Self-pay | Admitting: *Deleted

## 2013-10-07 ENCOUNTER — Ambulatory Visit: Payer: No Typology Code available for payment source

## 2013-10-18 ENCOUNTER — Encounter: Payer: Self-pay | Admitting: Infectious Diseases

## 2013-10-18 ENCOUNTER — Ambulatory Visit (INDEPENDENT_AMBULATORY_CARE_PROVIDER_SITE_OTHER): Payer: No Typology Code available for payment source | Admitting: Infectious Diseases

## 2013-10-18 VITALS — BP 135/82 | HR 67 | Temp 97.7°F | Ht 68.0 in | Wt 187.0 lb

## 2013-10-18 DIAGNOSIS — B20 Human immunodeficiency virus [HIV] disease: Secondary | ICD-10-CM

## 2013-10-18 DIAGNOSIS — I214 Non-ST elevation (NSTEMI) myocardial infarction: Secondary | ICD-10-CM

## 2013-10-18 DIAGNOSIS — K219 Gastro-esophageal reflux disease without esophagitis: Secondary | ICD-10-CM

## 2013-10-18 DIAGNOSIS — Z23 Encounter for immunization: Secondary | ICD-10-CM

## 2013-10-18 DIAGNOSIS — E291 Testicular hypofunction: Secondary | ICD-10-CM

## 2013-10-18 MED ORDER — EMTRICITAB-RILPIVIR-TENOFOV DF 200-25-300 MG PO TABS: 1.0000 | ORAL_TABLET | Freq: Every day | ORAL | Status: DC

## 2013-10-18 NOTE — Assessment & Plan Note (Addendum)
Will give him flu shot. He is not sexually active. Will change his triumeq to complera. He did not previously tolerate stribild (and he now has elevated Cr). Will ask him to separate complera/ppi by 12 hours. Will see him back in 2-3 months.

## 2013-10-18 NOTE — Assessment & Plan Note (Signed)
Greatly appreciate Dr Allison Quarry f/u.

## 2013-10-18 NOTE — Progress Notes (Signed)
   Subjective:    Patient ID: Albert Brown, male    DOB: November 07, 1955, 58 y.o.   MRN: [id number]  HPI 58 yo M with HIV+ (on triumeq) and no prior cardiac history, who presented to [facility] on 08/07/2013 with complaint of exertional chest discomfort. He was given sublingual nitroglycerin glycerin and had a hypotensive blood pressure response.. Of note, he had taken Cialis within 24 hours. His symptoms improved with IV hydration. He was also given aspirin, little Plavix and started on IV heparin. He was transferred to [facility] because of ongoing chest pain, elevated enzymes and hypotension. He was taken directly to the cath lab for cardiac catheterization.. He had minimal coronary artery disease in the LAD and circumflex but was found to have acute total occlusion of the mid RCA. This was successfully treated with PCI plus stenting utilizing a drug-eluting stent. He had well-preserved left ventricular systolic function (EF 62-94%). There was mild basilar to mid inferior hypokinesis. He was placed on dual antiplatelet therapy with aspirin plus Brilinta. He was also placed on statin therapy. He was discharged home on 08/10/2013. He had CV f/u on 6-30 and was started on beta-blocker (he denies taking this).  He was cautioned about using cialis with NTG.   He has been feeling well, had little mental duress over the MI. He has been hiking, mowed grass since event.   HIV 1 RNA Quant (copies/mL)  Date Value  09/06/2013 <20   03/09/2013 <20   11/19/2012 <20      CD4 T Cell Abs (/uL)  Date Value  09/06/2013 710   03/09/2013 770   11/19/2012 810    Lab Results  Component Value Date   CHOL 138 09/06/2013   HDL 44 09/06/2013   LDLCALC 65 09/06/2013   TRIG 143 09/06/2013   CHOLHDL 3.1 09/06/2013     Review of Systems  Constitutional: Negative for appetite change and unexpected weight change.  Respiratory: Positive for shortness of breath. Negative for chest tightness.   Cardiovascular:  Negative for chest pain.       Has had some mid-chest, musculoskeletal pain.   Gastrointestinal: Negative for diarrhea and constipation.  Genitourinary: Negative for difficulty urinating.  Has DOE with exertion, talking.  Has not taken Cialis since CV event. Has not taken any NTG since CV event.  At his f/u appt 08-2013 his Cr has gone to 1.63.      Objective:   Physical Exam  Constitutional: He appears well-developed and well-nourished.  HENT:  Mouth/Throat: No oropharyngeal exudate.  Eyes: EOM are normal. Pupils are equal, round, and reactive to light.  Neck: Neck supple.  Cardiovascular: Normal rate, regular rhythm and normal heart sounds.   Pulmonary/Chest: Effort normal and breath sounds normal.  Abdominal: Soft. Bowel sounds are normal. He exhibits no distension. There is no tenderness.  Musculoskeletal: He exhibits no edema.  Lymphadenopathy:    He has no cervical adenopathy.          Assessment & Plan:

## 2013-10-18 NOTE — Assessment & Plan Note (Signed)
He would like to get back in testosterone, will have him seen by endo.

## 2013-10-18 NOTE — Assessment & Plan Note (Signed)
He takes the PPI sparingly, not daily. I asked that he takes it only 12h apart from complera.

## 2013-11-04 ENCOUNTER — Ambulatory Visit: Payer: No Typology Code available for payment source

## 2013-11-04 NOTE — Addendum Note (Signed)
Addended by: Landis Gandy on: 11/04/2013 03:37 PM   Modules accepted: Orders

## 2013-11-11 ENCOUNTER — Ambulatory Visit: Payer: No Typology Code available for payment source

## 2013-11-18 ENCOUNTER — Ambulatory Visit: Payer: No Typology Code available for payment source

## 2013-11-30 ENCOUNTER — Encounter: Payer: Self-pay | Admitting: Cardiology

## 2013-12-02 ENCOUNTER — Ambulatory Visit: Payer: No Typology Code available for payment source

## 2013-12-03 ENCOUNTER — Ambulatory Visit (INDEPENDENT_AMBULATORY_CARE_PROVIDER_SITE_OTHER): Payer: No Typology Code available for payment source | Admitting: Cardiology

## 2013-12-03 ENCOUNTER — Encounter: Payer: Self-pay | Admitting: Cardiology

## 2013-12-03 VITALS — BP 104/66 | HR 84 | Ht 68.0 in | Wt 183.5 lb

## 2013-12-03 DIAGNOSIS — Z9861 Coronary angioplasty status: Secondary | ICD-10-CM

## 2013-12-03 DIAGNOSIS — E785 Hyperlipidemia, unspecified: Secondary | ICD-10-CM

## 2013-12-03 DIAGNOSIS — I2510 Atherosclerotic heart disease of native coronary artery without angina pectoris: Secondary | ICD-10-CM

## 2013-12-03 DIAGNOSIS — R079 Chest pain, unspecified: Secondary | ICD-10-CM

## 2013-12-03 DIAGNOSIS — I214 Non-ST elevation (NSTEMI) myocardial infarction: Secondary | ICD-10-CM

## 2013-12-03 DIAGNOSIS — Z955 Presence of coronary angioplasty implant and graft: Secondary | ICD-10-CM

## 2013-12-03 MED ORDER — PRASUGREL HCL 10 MG PO TABS: 10.0000 mg | ORAL_TABLET | Freq: Every day | ORAL | Status: DC

## 2013-12-03 NOTE — Patient Instructions (Signed)
STOP brilinta  Start EFFIENT 10 MG ONCE A DAILY  TAKE EFFIENT AND ASPIRIN FOR ONE MONTH,IF BRUISING IS STILL OCCURRING.REDUCE ASPIRIN TO EVERY OTHER DAY.  PLEASE HAVE PRIMARY TO FOLLOW UP WITH LIPIDS AT YOUR NEXT TIME LAB DRAW  Your physician wants you to follow-up in [place].  You will receive a reminder letter in the mail two months in advance. If you don't receive a letter, please call our office to schedule the follow-up appointment.

## 2013-12-03 NOTE — Progress Notes (Signed)
PATIENT: Albert Brown MRN: [id number] DOB: [birth year] PCP: [name], MD  Clinic Note: Chief Complaint  Patient presents with  . Chest Pain    x1 month, sternally located with some radiation towards the left. C/o of shortness of breath all the time and bruising issues.    HPI: Albert Brown is a 58 y.o. male with a PMH below who presents today for her first and a followup after seeing me since [place] and see back in June following his inferior MI -- this urocele issues, he had an occluded RCA.  I have not seen him since the cardiac catheterization lab on June 14 when he underwent PCI to the RCA.. We saw [place] back in June he was noticing some dyspnea issues with Brilinta, but was willing to keep trying to take it. He otherwise did not have any significant cardiac symptoms. He was started on low-dose metoprolol was stopped due to fatigue and decreased sex drive. ACE inhibitor had not been started in the hospital due to hypotension. He was started on pravastatin while in the hospital and has noted an excellent response with his lipid management.  Interval History: He presents today but his main issue being a strange sensation underneath his sternum it can radiate up toward the left arm or back. This is not what he had with his MI. It is not associated always with activity, in fact has had an episode today. It is not made worse with walking or exercise. Not associated with any dyspnea or diaphoresis. It is sometimes made worse depending on how he sleeps. The pain has been going on persistently for hours at a time with no other symptoms.   besides these episodes, he is pretty much back to his baseline level activity. She does not necessarily exercise routinely but has been working on trying to lose weight but her neck is unhealthy eating and trying to get some exercise and. He denies any heart conditions or PND, orthopnea or edema. No Chest discomfort or dyspnea associated with exertion. The  other notable symptoms he describes is these intermittent episodes of sphincter air that really come out of the blue not associated with any particular activity sometimes actually sleeping sometimes happen when just sitting around. He feels that he can't catch his breath when the episodes occur  Cardiovascular ROS: positive for - chest pain and As described above negative for - dyspnea on exertion, edema, irregular heartbeat, loss of consciousness, murmur, orthopnea, palpitations, paroxysmal nocturnal dyspnea, rapid heart rate or Lightheadedness, dizziness, syncope/near syncope, TIA/amaurosis fugax, claudication. :   Past Medical History  Diagnosis Date  . HIV infection   . EXTERNAL HEMORRHOIDS WITHOUT MENTION COMP   . ALLERGIC RHINITIS   . Allergic sinusitis   . Anxiety state, unspecified   . Cerumen impaction, right   . DEPRESSION   . Diarrhea   . Erectile dysfunction   . GERD   . HIV DISEASE   . HYPOGONADISM   . Periodontal disease   . PERIPHERAL NEUROPATHY   . PHARYNGITIS, RECURRENT   . Pterygium of left eye   . Urinary frequency   . VERTIGO   . Non-ST elevated myocardial infarction (non-STEMI) 08/08/2013    SubAcute100% RCA -- PCI; Echo: EF 55-605, Normal LV Size & Function - ~mild HK of Inferior Myocardium with mildly reduced RV function.  Normal Valve function  . CAD S/P percutaneous coronary angioplasty 08/08/2013    mRCA  Xience Alpine DES 2.75 mm x 33 mm (3.0  mm)    Prior Cardiac Evaluation and Past Surgical History: Past Surgical History  Procedure Laterality Date  . Percutaneous coronary stent intervention (pci-s)  08/08/2013    mRCA 100% --> Xience Alpine DES 2.75 mm x 33 mm --> 3.0 mm    Allergies  Allergen Reactions  . Brilinta [Ticagrelor] Shortness Of Breath    BREATHING ISSUES  . Codeine Anaphylaxis  . Bactrim [Sulfamethoxazole-Tmp Ds]     "Pt stated he died when taking this"  . Ciprofloxacin     REACTION: rash  . Dapsone     REACTION:  severe-anaphylaxis  . Sulfamethoxazole-Trimethoprim     Anaphylaxis     Current Outpatient Prescriptions  Medication Sig Dispense Refill  . aspirin 81 MG tablet Take 1 tablet (81 mg total) by mouth daily.      . Emtricitab-Rilpivir-Tenofovir 200-25-300 MG TABS Take 1 tablet by mouth daily.  90 tablet  5  . Multiple Vitamin (MULTI VITAMIN MENS PO) Take 1 tablet by mouth daily.      . nitroGLYCERIN (NITROSTAT) 0.4 MG SL tablet Place 1 tablet (0.4 mg total) under the tongue every 5 (five) minutes as needed for chest pain.  25 tablet  3  . pantoprazole (PROTONIX) 40 MG tablet Take 40 mg by mouth daily as needed (for acid reflux).      . pravastatin (PRAVACHOL) 40 MG tablet Take 1 tablet (40 mg total) by mouth daily.  30 tablet  11  . tadalafil (CIALIS) 5 MG tablet Take 5 mg by mouth daily as needed for erectile dysfunction.      Marland Kitchen testosterone (ANDROGEL) 50 MG/5GM (1%) GEL Place 5 g onto the skin daily.      . traZODone (DESYREL) 50 MG tablet Take 50 mg by mouth daily as needed for sleep.      . prasugrel (EFFIENT) 10 MG TABS tablet Take 1 tablet (10 mg total) by mouth daily.  30 tablet  11  . prasugrel (EFFIENT) 10 MG TABS tablet Take 1 tablet (10 mg total) by mouth daily.  14 tablet  0   No current facility-administered medications for this visit.    History   Social History Narrative  . No narrative on file    family history includes Cancer in his brother, father, and mother; Osteosarcoma in his father; Prostate cancer in his brother, brother, and father; Rheum arthritis in his mother.  ROS: A comprehensive Review of Systems - Was performed Review of Systems  Constitutional: Negative for malaise/fatigue.  HENT: Negative for nosebleeds.   Respiratory: Positive for shortness of breath. Negative for cough and wheezing.   Cardiovascular:       Per history of present illness  Gastrointestinal: Positive for heartburn. Negative for blood in stool and melena.  Genitourinary: Negative  for hematuria.  Musculoskeletal: Negative for joint pain and myalgias.  Neurological: Negative for dizziness, sensory change, speech change, focal weakness, seizures, loss of consciousness and weakness.  Endo/Heme/Allergies:       Intermittent mild old blood spots on his arm, but no new bruising or bleeding  Psychiatric/Behavioral: Negative for depression. The patient is nervous/anxious.   All other systems reviewed and are negative.  PHYSICAL EXAM BP 104/66  Pulse 84  Ht 5\' 8"  (1.727 m)  Wt 183 lb 8 oz (83.235 kg)  BMI 27.91 kg/m2 General appearance: alert, cooperative, appears stated age, no distress and Otherwise healthy-appearing. Well-nourished and well-groomed Neck: no adenopathy, no carotid bruit, no JVD and supple, symmetrical, trachea midline Lungs: clear to  auscultation bilaterally, normal percussion bilaterally and Nonlabored, good air movement Heart: regular rate and rhythm, S1, S2 normal, no murmur, click, rub or gallop and normal apical impulse Abdomen: soft, non-tender; bowel sounds normal; no masses,  no organomegaly Extremities: extremities normal, atraumatic, no cyanosis or edema Pulses: 2+ and symmetric Skin: Skin color, texture, turgor normal. No rashes or lesions Neurologic: Grossly normal   Adult ECG Report  Rate: 84 ;  Rhythm: normal sinus rhythm;  Inferior TWI, cannot rule of ischemia   Narrative Interpretation: Stable  Recent Labs:  Lipid Panel     Component Value Date/Time   CHOL 138 09/06/2013 1616   TRIG 143 09/06/2013 1616   HDL 44 09/06/2013 1616   CHOLHDL 3.1 09/06/2013 1616   VLDL 29 09/06/2013 1616   LDLCALC 65 09/06/2013 1616    ASSESSMENT / PLAN: Relatively healthy gentleman doing well 4 months post MI. He is having some atypical sounding central chest pain that is not consistent with his MI related anginal pain.   NSTEMI (non-ST elevated myocardial infarction) Eugenie Birks is MI. Nightly use ejection fraction was not affected he has not had any  significant anginal symptoms or heart failure symptoms since his PCI.  CAD S/P percutaneous coronary angioplasty - Xience outline DES to mid RCA (2.75 mm x 33 mm --> 3.0 mm) He has a large stent in place, is on aspirin plus Brilinta. I am concerned with his dyspnea spells that this is probably related to Brilinta. He is only 58 years old, therefore not at significant increased risk of bleeding. I will convert him from Brilinta to Effient to complete the first year of dual antiplatelet therapy. I would then consider switching over to Plavix after completion of one year. With him being on a new generation antiplatelet agent, I think to back off on his aspirin. We can back off to half a day and potentially even stop aspirin if his bruising still continues on Effient. I've asked him to continue taking it once daily for the first month of Effient just to see if there is any difference in the little "blood spots" he has been having. He is not on a beta blocker due to fatigue concerns. He does not have enough blood pressure room for adding ACE inhibitor or ARB.   Hyperlipidemia with target LDL less than 70 Started on Pravachol while in the hospital. His most recent panel is excellent. We'll continue with current medications he is tolerating well.  Chest pain, central The chest discomfort he is having will be starting from the location standpoint is not consistent with his anginal pain which was basically almost both arms involved and associated with dyspnea. He did not seem to think that this was his anginal:, Therefore on clinical ligament is probably more muscular skeletal in nature. I reassured him that it was not exacerbated by exertion it is probably musculoskeletal in nature.    Orders Placed This Encounter  Procedures  . EKG 12-Lead   Meds ordered this encounter  Medications  . prasugrel (EFFIENT) 10 MG TABS tablet    Sig: Take 1 tablet (10 mg total) by mouth daily.    Dispense:  30 tablet     Refill:  11  . prasugrel (EFFIENT) 10 MG TABS tablet    Sig: Take 1 tablet (10 mg total) by mouth daily.    Dispense:  14 tablet    Refill:  0    LOT [id number] A EXP 09-2014    Followup: 6 month  [name], M.D., M.S. Interventional Cardiologist   Pager # [phone]

## 2013-12-04 NOTE — Assessment & Plan Note (Signed)
Albert Brown is MI. Nightly use ejection fraction was not affected he has not had any significant anginal symptoms or heart failure symptoms since his PCI.

## 2013-12-05 DIAGNOSIS — E785 Hyperlipidemia, unspecified: Secondary | ICD-10-CM | POA: Insufficient documentation

## 2013-12-05 DIAGNOSIS — R079 Chest pain, unspecified: Secondary | ICD-10-CM | POA: Insufficient documentation

## 2013-12-05 NOTE — Assessment & Plan Note (Addendum)
He has a large stent in place, is on aspirin plus Brilinta. I am concerned with his dyspnea spells that this is probably related to Brilinta. He is only 58 years old, therefore not at significant increased risk of bleeding. I will convert him from Brilinta to Effient to complete the first year of dual antiplatelet therapy. I would then consider switching over to Plavix after completion of one year. With him being on a new generation antiplatelet agent, I think to back off on his aspirin. We can back off to half a day and potentially even stop aspirin if his bruising still continues on Effient. I've asked him to continue taking it once daily for the first month of Effient just to see if there is any difference in the little "blood spots" he has been having. He is not on a beta blocker due to fatigue concerns. He does not have enough blood pressure room for adding ACE inhibitor or ARB.

## 2013-12-05 NOTE — Assessment & Plan Note (Signed)
Started on Pravachol while in the hospital. His most recent panel is excellent. We'll continue with current medications he is tolerating well.

## 2013-12-05 NOTE — Assessment & Plan Note (Signed)
The chest discomfort he is having will be starting from the location standpoint is not consistent with his anginal pain which was basically almost both arms involved and associated with dyspnea. He did not seem to think that this was his anginal:, Therefore on clinical ligament is probably more muscular skeletal in nature. I reassured him that it was not exacerbated by exertion it is probably musculoskeletal in nature.

## 2013-12-09 ENCOUNTER — Ambulatory Visit: Payer: No Typology Code available for payment source

## 2013-12-16 ENCOUNTER — Ambulatory Visit: Payer: No Typology Code available for payment source

## 2013-12-30 ENCOUNTER — Ambulatory Visit: Payer: No Typology Code available for payment source

## 2014-01-03 ENCOUNTER — Other Ambulatory Visit: Payer: No Typology Code available for payment source

## 2014-01-03 DIAGNOSIS — B20 Human immunodeficiency virus [HIV] disease: Secondary | ICD-10-CM

## 2014-01-03 DIAGNOSIS — I214 Non-ST elevation (NSTEMI) myocardial infarction: Secondary | ICD-10-CM

## 2014-01-04 LAB — CBC
HCT: 43.9 % (ref 39.0–52.0)
Hemoglobin: 14.6 g/dL (ref 13.0–17.0)
MCH: 31.1 pg (ref 26.0–34.0)
MCHC: 33.3 g/dL (ref 30.0–36.0)
MCV: 93.4 fL (ref 78.0–100.0)
Platelets: 167 10*3/uL (ref 150–400)
RBC: 4.7 MIL/uL (ref 4.22–5.81)
RDW: 12.6 % (ref 11.5–15.5)
WBC: 6.3 10*3/uL (ref 4.0–10.5)

## 2014-01-04 LAB — COMPLETE METABOLIC PANEL WITH GFR
ALT: 44 U/L (ref 0–53)
AST: 30 U/L (ref 0–37)
Albumin: 4.3 g/dL (ref 3.5–5.2)
Alkaline Phosphatase: 80 U/L (ref 39–117)
BUN: 21 mg/dL (ref 6–23)
CO2: 27 mEq/L (ref 19–32)
Calcium: 9.4 mg/dL (ref 8.4–10.5)
Chloride: 104 mEq/L (ref 96–112)
Creat: 1.24 mg/dL (ref 0.50–1.35)
GFR, Est African American: 75 mL/min
GFR, Est Non African American: 65 mL/min
Glucose, Bld: 101 mg/dL — ABNORMAL HIGH (ref 70–99)
Potassium: 4.1 mEq/L (ref 3.5–5.3)
Sodium: 141 mEq/L (ref 135–145)
Total Bilirubin: 0.4 mg/dL (ref 0.2–1.2)
Total Protein: 6.8 g/dL (ref 6.0–8.3)

## 2014-01-04 LAB — LIPID PANEL
Cholesterol: 117 mg/dL (ref 0–200)
HDL: 38 mg/dL — ABNORMAL LOW (ref >39)
LDL CALC: 42 mg/dL (ref 0–99)
Total CHOL/HDL Ratio: 3.1 Ratio
Triglycerides: 183 mg/dL — ABNORMAL HIGH (ref <150)
VLDL: 37 mg/dL (ref 0–40)

## 2014-01-04 LAB — T-HELPER CELL (CD4) - (RCID CLINIC ONLY)
CD4 T CELL ABS: 720 /uL (ref 400–2700)
CD4 T CELL HELPER: 35 % (ref 33–55)

## 2014-01-05 LAB — HIV-1 RNA QUANT-NO REFLEX-BLD: HIV-1 RNA Quant, Log: <1.3 {Log} (ref <1.30)

## 2014-01-17 ENCOUNTER — Encounter: Payer: Self-pay | Admitting: Infectious Diseases

## 2014-01-17 ENCOUNTER — Ambulatory Visit (INDEPENDENT_AMBULATORY_CARE_PROVIDER_SITE_OTHER): Payer: No Typology Code available for payment source | Admitting: Infectious Diseases

## 2014-01-17 VITALS — BP 146/87 | HR 74 | Temp 98.0°F | Wt 190.0 lb

## 2014-01-17 DIAGNOSIS — Z113 Encounter for screening for infections with a predominantly sexual mode of transmission: Secondary | ICD-10-CM

## 2014-01-17 DIAGNOSIS — E291 Testicular hypofunction: Secondary | ICD-10-CM

## 2014-01-17 DIAGNOSIS — H6121 Impacted cerumen, right ear: Secondary | ICD-10-CM

## 2014-01-17 DIAGNOSIS — K625 Hemorrhage of anus and rectum: Secondary | ICD-10-CM

## 2014-01-17 DIAGNOSIS — B20 Human immunodeficiency virus [HIV] disease: Secondary | ICD-10-CM

## 2014-01-17 DIAGNOSIS — E349 Endocrine disorder, unspecified: Secondary | ICD-10-CM

## 2014-01-17 DIAGNOSIS — Z79899 Other long term (current) drug therapy: Secondary | ICD-10-CM

## 2014-01-17 MED ORDER — TESTOSTERONE 50 MG/5GM (1%) TD GEL: 5.0000 g | Freq: Every day | TRANSDERMAL | Status: DC

## 2014-01-17 NOTE — Assessment & Plan Note (Signed)
Doing well on complera. Refuses flu. Offered/refused condoms. Hep B immune. PNVX up to date. Will see him back in 6 months.

## 2014-01-17 NOTE — Assessment & Plan Note (Signed)
- 

## 2014-01-17 NOTE — Progress Notes (Signed)
   Subjective:    Patient ID: Albert Brown, male    DOB: Sep 02, 1955, 58 y.o.   MRN: [id number]  HPI 58 yo M with HIV+ (on triumeq) and no prior cardiac history, who presented to [facility] on 08/07/2013 with complaint of exertional chest discomfort. He was given sublingual nitroglycerin glycerin and had a hypotensive blood pressure response.. Of note, he had taken Cialis within 24 hours. His symptoms improved with IV hydration. He was also given aspirin, little Plavix and started on IV heparin. He was transferred to [facility] because of ongoing chest pain, elevated enzymes and hypotension. ON cath he was found to have minimal coronary artery disease in the LAD and circumflex but was found to have acute total occlusion of the mid RCA. This was successfully treated with PCI plus stenting utilizing a drug-eluting stent. He had well-preserved left ventricular systolic function (EF 46-27%). There was mild basilar to mid inferior hypokinesis. He was placed on dual antiplatelet therapy with aspirin plus Brilinta. He was also placed on statin therapy. He was discharged home on 08/10/2013. He had CV f/u on 6-30 and was started on beta-blocker (he denies taking this). He was cautioned about using cialis with NTG.  His art was changed to complera (from triumeq, on stribild prior to that with increased Cr). Has been having problems with his L ear- painful, felt like he had water in his head. would like checked. Has had rectal bleeding with each BM for a month. No clots, no melena. Not in stool.   Has been discussing androgel with CV but his insurance won't pay for. Wants to get injections. Has been feeling fatigued.  Can't afford to go to GP.   HIV 1 RNA QUANT (copies/mL)  Date Value  01/03/2014 <20  09/06/2013 <20  03/09/2013 <20   CD4 T CELL ABS (/uL)  Date Value  01/03/2014 720  09/06/2013 710  03/09/2013 770   Lab Results  Component Value Date   CHOL 117 01/03/2014   HDL  38* 01/03/2014   LDLCALC 42 01/03/2014   TRIG 183* 01/03/2014   CHOLHDL 3.1 01/03/2014    Has not been exercising. Does not have the desire, energy to do it. Attributes his BP to having cooked a ham recently.   Refuses flu shot  Review of Systems  Constitutional: Positive for fatigue. Negative for appetite change and unexpected weight change.  Respiratory: Negative for shortness of breath.   Cardiovascular: Negative for chest pain.  Gastrointestinal: Positive for blood in stool. Negative for diarrhea and constipation.  Genitourinary: Negative for difficulty urinating.       Objective:   Physical Exam  Constitutional: He appears well-developed and well-nourished.  HENT:  Mouth/Throat: No oropharyngeal exudate.  cerrumen in R ear, removed with q-tip.   Eyes: EOM are normal. Pupils are equal, round, and reactive to light.  Neck: Neck supple.  Cardiovascular: Normal rate, regular rhythm and normal heart sounds.   Pulmonary/Chest: Effort normal and breath sounds normal.  Abdominal: Soft. Bowel sounds are normal. He exhibits no distension. There is no tenderness.  Lymphadenopathy:    He has no cervical adenopathy.          Assessment & Plan:

## 2014-01-17 NOTE — Assessment & Plan Note (Addendum)
Wouldl start him on low dose injections  (He states he has discussed these with his cardiologist who felt that this was ok) except there appears to be sulfa in these and he is allergic to sulfa (he had anaphylaxis?). I would be very hesitant to give him this without ER supervision.

## 2014-01-24 ENCOUNTER — Other Ambulatory Visit: Payer: Self-pay | Admitting: Infectious Diseases

## 2014-01-24 DIAGNOSIS — E349 Endocrine disorder, unspecified: Secondary | ICD-10-CM

## 2014-01-24 MED ORDER — TESTOSTERONE 20.25 MG/ACT (1.62%) TD GEL: 40.0000 mg | Freq: Every day | TRANSDERMAL | Status: DC

## 2014-01-27 ENCOUNTER — Ambulatory Visit: Payer: No Typology Code available for payment source

## 2014-02-03 ENCOUNTER — Encounter (HOSPITAL_COMMUNITY): Payer: Self-pay | Admitting: Cardiology

## 2014-02-03 ENCOUNTER — Ambulatory Visit: Payer: No Typology Code available for payment source

## 2014-02-10 ENCOUNTER — Ambulatory Visit: Payer: No Typology Code available for payment source

## 2014-03-01 ENCOUNTER — Other Ambulatory Visit: Payer: Self-pay | Admitting: Licensed Clinical Social Worker

## 2014-03-01 MED ORDER — TESTOSTERONE 20.25 MG/ACT (1.62%) TD GEL: 40.0000 mg | Freq: Every day | TRANSDERMAL | Status: DC

## 2014-03-03 ENCOUNTER — Ambulatory Visit: Payer: No Typology Code available for payment source

## 2014-03-08 ENCOUNTER — Telehealth: Payer: Self-pay | Admitting: *Deleted

## 2014-03-08 NOTE — Telephone Encounter (Signed)
Completed Prior Authorization request for Androgel rx via telephone.  [phone] - [facility])  Waiting on fax response.

## 2014-03-09 NOTE — Telephone Encounter (Signed)
[id number] Denied - needing appeal.  Pt MUST have tried and failed Androdem or brand Testim.  Fax sent to [facility] to receive "internal rule, guideline or clinical criteria for ANDROGEL PUMP coverage."

## 2014-03-10 ENCOUNTER — Ambulatory Visit: Payer: No Typology Code available for payment source

## 2014-03-17 ENCOUNTER — Ambulatory Visit: Payer: No Typology Code available for payment source

## 2014-03-24 ENCOUNTER — Ambulatory Visit: Payer: 59

## 2014-03-24 DIAGNOSIS — F331 Major depressive disorder, recurrent, moderate: Secondary | ICD-10-CM

## 2014-03-24 NOTE — BH Specialist Note (Signed)
Albert Brown was in a fairly pleasant mood today, reporting that he and his roommate (a former partner) are continuing to get along.  He says he "loves him" but is not "in love" with him.  He then talked about job frustrations, saying the company he works for [employer]) is installing 50 cameras at his location, which makes him very nervous that they will be spying on him.  He said this is the thing that will motivate him to finally make a change.  He is considering applying for a job at [employer], but dreads making a change.  Overall, his mood is stable.  Plan to meet in one week. Albert Spice, LCSW Whodas: 15

## 2014-03-31 ENCOUNTER — Ambulatory Visit: Payer: 59

## 2014-03-31 DIAGNOSIS — F331 Major depressive disorder, recurrent, moderate: Secondary | ICD-10-CM

## 2014-03-31 NOTE — BH Specialist Note (Signed)
Albert Brown was in a fairly good mood today, but did express his frustration over the fact that his roommate was returning from a trip much sooner than he expected.  He continues to vent about this roommate and how he wishes he was out of his life.  However, when it comes to asking him to leave, Dickson feels bad for him and lets him stay.  He also talked about his past counselors/therapists and how they were not helpful, adding that he feels very comfortable talking with me.  Plan to meet again in one week.  [name], LCSW  [place]: [phone]

## 2014-04-06 ENCOUNTER — Telehealth: Payer: Self-pay | Admitting: *Deleted

## 2014-04-06 NOTE — Telephone Encounter (Signed)
Patient's Androgel 1/62% approved, [id number]. Pharmacy notified. Patient notified. Should patient come in for any lab work before starting the [place]? [facility] company required a 1 - testosterone level and 2 - failure of Androderm in order to authorize this prescription.) Landis Gandy, RN

## 2014-04-07 ENCOUNTER — Ambulatory Visit: Payer: 59

## 2014-04-07 DIAGNOSIS — F330 Major depressive disorder, recurrent, mild: Secondary | ICD-10-CM

## 2014-04-07 NOTE — BH Specialist Note (Signed)
Albert Brown reported that his roommate was having pains, so he brought him to the clinic yesterday and they told him that he may have cancer.  Albert Brown was concerned about this and seemed somewhat stressed about the implications of this, since his relationship with his roommate is complicated - they used to be partners, but have just been roommates for several years now.  He also talked about his job and how he wants to find a better one, but dreads any kind of change.  Despite stressors, he seems to be doing fairly well.  Plan to meet again in one week. [name], LCSW  Whodas: [phone]

## 2014-04-14 ENCOUNTER — Ambulatory Visit: Payer: 59

## 2014-04-14 DIAGNOSIS — F331 Major depressive disorder, recurrent, moderate: Secondary | ICD-10-CM
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
## 2014-04-15 ENCOUNTER — Telehealth: Payer: Self-pay | Admitting: Licensed Clinical Social Worker

## 2014-04-15 NOTE — Telephone Encounter (Signed)
Patient wanted to let Sharyn Lull, RN and Dr. Johnnye Sima   know that he could not afford the copay on androgel and wanted to know if there is something else available for him.

## 2014-04-19 ENCOUNTER — Telehealth: Payer: Self-pay | Admitting: *Deleted

## 2014-04-19 NOTE — Telephone Encounter (Signed)
Called the [facility] and renewed Albert Brown's application.  He is eligible through 04-22-15.

## 2014-04-21 ENCOUNTER — Ambulatory Visit: Payer: 59

## 2014-04-21 DIAGNOSIS — F331 Major depressive disorder, recurrent, moderate: Secondary | ICD-10-CM

## 2014-04-21 NOTE — BH Specialist Note (Signed)
Albert Brown talked about the things that have held him back over the years and expressed that he wishes he could get over them and move on.  I talked to him about EMDR and asked about specific incidents in his life that he recalls as culprits in affecting his sense of self worth.  He talked about several things in childhood - recognizing that he was different due to being gay, feeling that he wasn't good enough when he was at school, etc.  He also talked about his relationship with [name] - his former partner and current roommate.  He agreed that EMDR may help him change some things.  Plan to meet in one week. [name], LCSW  [place]: [phone]

## 2014-04-25 ENCOUNTER — Other Ambulatory Visit: Payer: Self-pay | Admitting: *Deleted

## 2014-04-25 DIAGNOSIS — E291 Testicular hypofunction: Secondary | ICD-10-CM

## 2014-04-25 MED ORDER — TESTOSTERONE 20.25 MG/ACT (1.62%) TD GEL: 40.0000 mg | Freq: Every day | TRANSDERMAL | Status: DC

## 2014-04-25 NOTE — Telephone Encounter (Signed)
[facility] Application for AndroGel.

## 2014-04-28 ENCOUNTER — Ambulatory Visit: Payer: 59

## 2014-04-28 DIAGNOSIS — F331 Major depressive disorder, recurrent, moderate: Secondary | ICD-10-CM

## 2014-04-28 NOTE — BH Specialist Note (Signed)
Albert Brown was somewhat distraught today, reporting that he has had quite a week.  He said his brother's mother in law died last week and they had the funeral this week.  In addition, his roommate (and former lover), [name], just found out that he has leukemia and also a malignant tumor in his lower abdomen.  He said [name] is refusing to get treatment for this despite the fact that he was told that if he doesn't get treatment he will die in 6-12 months.  [name] doesn't want to have to be the caregiver for him either way, but doesn't know what to do.  He said [name] has no family that would step up and help out.  I gave him support and tried to problem-solve with him about what to do.  Plan to meet again in one week. [name], LCSW  [place]: [phone]

## 2014-05-05 ENCOUNTER — Ambulatory Visit: Payer: 59

## 2014-05-05 DIAGNOSIS — F331 Major depressive disorder, recurrent, moderate: Secondary | ICD-10-CM

## 2014-05-05 NOTE — BH Specialist Note (Signed)
Albert Brown was pleasant today, reporting that his roommate talked to the [place] this past week and may be accepting their offer to help him by paying for his care.  This is a major concern of Kell's because this roommate has been a burden on him for several years and does not consistently contribute to household expenses.  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  He agreed that this would help him get a job there.  He was also concerned about getting a request from the IRS about his taxes and they are withholding his refund.  I had Casey from [facility] come and help him with this.  Plan to meet again in one week. [name], LCSW  [place]: [phone]

## 2014-05-12 ENCOUNTER — Ambulatory Visit: Payer: 59

## 2014-05-12 DIAGNOSIS — F331 Major depressive disorder, recurrent, moderate: Secondary | ICD-10-CM

## 2014-05-12 NOTE — BH Specialist Note (Signed)
Spiros came in reporting that his stress level is very high lately.  He continues to deal with his roommate's illnesses - a malignant tumor and pancreatic cancer.  His roommate apparently is refusing treatment for this and it is very stressful to Yutan.  In addition, he is very stressed about his taxes, which he has already done, but then was contacted by the IRS to send in 2 other forms, which he doesn't know how to fill out.  I found a list of free tax assistance services in the area and this seemed to greatly help him to feel some relief.  He thanked me several times for this.  Plan to meet again in one week. [name], LCSW  [place]: [phone]

## 2014-05-19 ENCOUNTER — Ambulatory Visit: Payer: 59

## 2014-05-19 DIAGNOSIS — F330 Major depressive disorder, recurrent, mild: Secondary | ICD-10-CM

## 2014-05-19 NOTE — BH Specialist Note (Signed)
Albert Brown was in a fairly pleasant mood today, but talked again about his difficult roommate, who claims to have cancer.  He struggles with guilt about how he sometimes treats this roommate, but I told him that he is being too hard on himself and that he needs to give himself a break.  I explained the symptoms of borderline personality disorder and his roommate seems to meet most of the criteria.  I told him I am not diagnosing him because he is not here, but that people with this disorder are extremely difficult to work with, even for veteran, skilled clinicians.  He said he still feels bad about it sometimes.  Plan to meet again in 2 weeks. [name], LCSW  [place]: [phone]

## 2014-06-02 ENCOUNTER — Ambulatory Visit: Payer: 59

## 2014-06-02 DIAGNOSIS — F330 Major depressive disorder, recurrent, mild: Secondary | ICD-10-CM

## 2014-06-02 NOTE — BH Specialist Note (Signed)
Albert Brown was in a fairly good mood today, reporting that his roommate, [name], has been staying with a friend more lately and this has eased some of the tension between them.  [name] continues to worry about [name]'s health because [name] claims he has leukemia and has been given 3-6 months to live, though [name] doesn't have actual proof that this is true.  I suggested to him to consider that [name] is manipulating him, but he said he did hear a phone conversation with a woman from the [employer] of [place].  Nevertheless, he says he and [name] get along better when he isn't there at the house all the time.  He continues to be hard on himself, especially when he makes mistakes at work.  I challenged him to work on letting himself off the hook more.  Plan to meet in one week. [name], LCSW  [place]: [phone]

## 2014-06-09 ENCOUNTER — Ambulatory Visit: Payer: 59

## 2014-06-16 ENCOUNTER — Ambulatory Visit: Payer: 59

## 2014-06-16 DIAGNOSIS — F330 Major depressive disorder, recurrent, mild: Secondary | ICD-10-CM

## 2014-06-16 NOTE — BH Specialist Note (Signed)
Criston was in a fairly good mood today, reporting that he is getting along better with his ex-partner/sometimes roommate, [name].  He feels that because of their past history he feels a certain obligation to be a support to him as he goes through the next few months, dealing with cancer.  He continues to be hard on himself about things and I talked to him about focusing on goals and being positive, instead of saying negative things to himself and creating a "self fulfilling prophecy".  Plan to meet in one week. [name], LCSW  [place]: [phone]

## 2014-06-23 ENCOUNTER — Ambulatory Visit: Payer: 59

## 2014-06-23 DIAGNOSIS — F330 Major depressive disorder, recurrent, mild: Secondary | ICD-10-CM
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
## 2014-06-30 ENCOUNTER — Ambulatory Visit: Payer: 59

## 2014-06-30 DIAGNOSIS — F330 Major depressive disorder, recurrent, mild: Secondary | ICD-10-CM

## 2014-06-30 NOTE — BH Specialist Note (Signed)
Albert Brown was in a fairly pleasant mood today and had few complaints compared to previous sessions.  He talked some about coming out as a gay man when he was in his 31s and how surprised he was when his family accepted that news.  He continues to be concerned about his roommate who is dealing with a serious cancer diagnosis.  Overall, Albert Brown's mood seems to be good today.  Plan to meet in one week. [name], LCSW  [place]: [phone]

## 2014-07-07 ENCOUNTER — Ambulatory Visit: Payer: 59

## 2014-07-14 ENCOUNTER — Ambulatory Visit: Payer: 59

## 2014-07-14 DIAGNOSIS — F330 Major depressive disorder, recurrent, mild: Secondary | ICD-10-CM

## 2014-07-14 NOTE — BH Specialist Note (Signed)
Rob was in a pleasant mood today, but reported that his job is requiring him to change his schedule to 8 to 4, which means he will no longer be able to see me every week.  He talked about his efforts for self improvement, which include trying to stop judging people.  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  He said coming in to talk has been very helpful for him and he regrets not being able to do it.  I told him I enjoyed working with him and to call if anything changes with his schedule or if anything comes up.   [name], LCSW   [place]: [phone]

## 2014-07-21 ENCOUNTER — Ambulatory Visit: Payer: 59

## 2014-08-04 ENCOUNTER — Ambulatory Visit: Payer: 59

## 2014-08-17 ENCOUNTER — Other Ambulatory Visit: Payer: 59

## 2014-08-17 DIAGNOSIS — Z79899 Other long term (current) drug therapy: Secondary | ICD-10-CM

## 2014-08-17 DIAGNOSIS — E349 Endocrine disorder, unspecified: Secondary | ICD-10-CM

## 2014-08-17 DIAGNOSIS — B20 Human immunodeficiency virus [HIV] disease: Secondary | ICD-10-CM

## 2014-08-17 DIAGNOSIS — Z113 Encounter for screening for infections with a predominantly sexual mode of transmission: Secondary | ICD-10-CM

## 2014-08-17 LAB — COMPREHENSIVE METABOLIC PANEL
ALT: 28 U/L (ref 0–53)
AST: 21 U/L (ref 0–37)
Albumin: 4.5 g/dL (ref 3.5–5.2)
Alkaline Phosphatase: 90 U/L (ref 39–117)
BILIRUBIN TOTAL: 0.5 mg/dL (ref 0.2–1.2)
BUN: 19 mg/dL (ref 6–23)
CHLORIDE: 107 meq/L (ref 96–112)
CO2: 25 mEq/L (ref 19–32)
Calcium: 9.7 mg/dL (ref 8.4–10.5)
Creat: 1.26 mg/dL (ref 0.50–1.35)
GLUCOSE: 90 mg/dL (ref 70–99)
POTASSIUM: 4.8 meq/L (ref 3.5–5.3)
Sodium: 141 mEq/L (ref 135–145)
TOTAL PROTEIN: 7 g/dL (ref 6.0–8.3)

## 2014-08-17 LAB — LIPID PANEL
CHOL/HDL RATIO: 3.5 ratio
Cholesterol: 138 mg/dL (ref 0–200)
HDL: 40 mg/dL (ref >=40)
LDL CALC: 66 mg/dL (ref 0–99)
Triglycerides: 159 mg/dL — ABNORMAL HIGH (ref <150)
VLDL: 32 mg/dL (ref 0–40)

## 2014-08-17 LAB — CBC
HCT: 43.2 % (ref 39.0–52.0)
Hemoglobin: 14.6 g/dL (ref 13.0–17.0)
MCH: 29.7 pg (ref 26.0–34.0)
MCHC: 33.8 g/dL (ref 30.0–36.0)
MCV: 87.8 fL (ref 78.0–100.0)
MPV: 10.7 fL (ref 8.6–12.4)
Platelets: 178 10*3/uL (ref 150–400)
RBC: 4.92 MIL/uL (ref 4.22–5.81)
RDW: 13.8 % (ref 11.5–15.5)
WBC: 5.8 10*3/uL (ref 4.0–10.5)

## 2014-08-18 ENCOUNTER — Ambulatory Visit: Payer: 59

## 2014-08-18 LAB — PSA: PSA: 3.83 ng/mL (ref <=4.00)

## 2014-08-18 LAB — RPR

## 2014-08-18 LAB — TESTOSTERONE: Testosterone: 284 ng/dL — ABNORMAL LOW (ref 300–890)

## 2014-08-19 LAB — T-HELPER CELL (CD4) - (RCID CLINIC ONLY)
CD4 T CELL HELPER: 30 % — AB (ref 33–55)
CD4 T Cell Abs: 570 /uL (ref 400–2700)

## 2014-08-19 LAB — URINE CYTOLOGY ANCILLARY ONLY
Chlamydia: NEGATIVE
Neisseria Gonorrhea: NEGATIVE

## 2014-08-21 LAB — HIV-1 RNA QUANT-NO REFLEX-BLD

## 2014-09-28 ENCOUNTER — Other Ambulatory Visit: Payer: Self-pay | Admitting: Infectious Diseases

## 2014-10-17 ENCOUNTER — Other Ambulatory Visit: Payer: Self-pay | Admitting: Infectious Diseases

## 2014-10-17 DIAGNOSIS — R7989 Other specified abnormal findings of blood chemistry: Secondary | ICD-10-CM

## 2014-10-29 ENCOUNTER — Other Ambulatory Visit: Payer: Self-pay | Admitting: Infectious Diseases

## 2014-10-29 DIAGNOSIS — B20 Human immunodeficiency virus [HIV] disease: Secondary | ICD-10-CM

## 2014-12-14 ENCOUNTER — Other Ambulatory Visit: Payer: Self-pay | Admitting: Cardiology

## 2014-12-26 ENCOUNTER — Ambulatory Visit (INDEPENDENT_AMBULATORY_CARE_PROVIDER_SITE_OTHER): Payer: 59 | Admitting: Infectious Diseases

## 2014-12-26 ENCOUNTER — Encounter: Payer: Self-pay | Admitting: Infectious Diseases

## 2014-12-26 VITALS — BP 143/89 | HR 66 | Temp 98.5°F | Ht 68.0 in | Wt 190.1 lb

## 2014-12-26 DIAGNOSIS — E785 Hyperlipidemia, unspecified: Secondary | ICD-10-CM

## 2014-12-26 DIAGNOSIS — Z113 Encounter for screening for infections with a predominantly sexual mode of transmission: Secondary | ICD-10-CM

## 2014-12-26 DIAGNOSIS — F32A Depression, unspecified: Secondary | ICD-10-CM

## 2014-12-26 DIAGNOSIS — F329 Major depressive disorder, single episode, unspecified: Secondary | ICD-10-CM | POA: Diagnosis not present

## 2014-12-26 DIAGNOSIS — Z79899 Other long term (current) drug therapy: Secondary | ICD-10-CM

## 2014-12-26 DIAGNOSIS — I214 Non-ST elevation (NSTEMI) myocardial infarction: Secondary | ICD-10-CM

## 2014-12-26 DIAGNOSIS — B20 Human immunodeficiency virus [HIV] disease: Secondary | ICD-10-CM

## 2014-12-26 MED ORDER — EMTRICITAB-RILPIVIR-TENOFOV AF 200-25-25 MG PO TABS: 1.0000 | ORAL_TABLET | Freq: Every day | ORAL | Status: DC
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
## 2014-12-26 NOTE — Assessment & Plan Note (Signed)
He's doing well Refuses flu shot Offered/refused condoms.  Change to odefsy? rtc in 6 months

## 2014-12-26 NOTE — Assessment & Plan Note (Signed)
Will get him back in with his cardiologist Ellyn Hack).  He's unsure about what he should still be taking for anti-coag, anti-plt tx

## 2014-12-26 NOTE — Assessment & Plan Note (Signed)
Has quit taking statin.  Will get him back in with Cv

## 2014-12-26 NOTE — Assessment & Plan Note (Signed)
Will see Albert Brown in Am Encouraged him to exercise and watch diet

## 2014-12-27 ENCOUNTER — Ambulatory Visit: Payer: 59

## 2014-12-27 DIAGNOSIS — F329 Major depressive disorder, single episode, unspecified: Secondary | ICD-10-CM

## 2014-12-27 DIAGNOSIS — F32A Depression, unspecified: Secondary | ICD-10-CM

## 2014-12-27 NOTE — BH Specialist Note (Signed)
I met with Albert Brown for the first time in several months now that his work schedule allows him to come in.  He said he went through a depression after quitting therapy and had to work on getting over it.  But now, he said it feels good to come back and be able to talk to someone about things.  He continues to be frustrated with work, but likes his new schedule - 6 am to 2 pm.  He also continues to deal with his old friend/former partner, [name], who still comes by and stays at his place once in a while.  Plan to meet again in one week. [name], LCSW

## 2015-01-02 ENCOUNTER — Ambulatory Visit: Payer: 59

## 2015-01-02 DIAGNOSIS — F32A Depression, unspecified: Secondary | ICD-10-CM

## 2015-01-02 DIAGNOSIS — F329 Major depressive disorder, single episode, unspecified: Secondary | ICD-10-CM

## 2015-01-02 NOTE — BH Specialist Note (Signed)
Lotus was in a good mood today but talked about how he is ready for this election to be over with.  He also talked about his frustrations with his job and how he wants to look for another one but can't muster the courage to do it.  He said his ex-partner was nice over the weekend and that they are getting along better lately.  Plan to meet again in one week. [name], LCSW

## 2015-01-09 ENCOUNTER — Ambulatory Visit: Payer: 59

## 2015-01-09 DIAGNOSIS — F32A Depression, unspecified: Secondary | ICD-10-CM

## 2015-01-09 DIAGNOSIS — F329 Major depressive disorder, single episode, unspecified: Secondary | ICD-10-CM

## 2015-01-09 NOTE — BH Specialist Note (Signed)
Albert Brown was in a fairly pleasant mood today but talked about how he has been in such a rut with his life and wants to learn how to get out of it.  I talked to him about clinical hypnosis and explained some of how it works, debunking some of the myths about it, assuring him that it is safe.  He admits that he is hard on himself and struggles with low self esteem at times.  Plan to meet again in 2 weeks. Albert Spice, LCSW

## 2015-01-12 ENCOUNTER — Other Ambulatory Visit: Payer: Self-pay | Admitting: *Deleted

## 2015-01-12 ENCOUNTER — Other Ambulatory Visit: Payer: Self-pay | Admitting: Cardiology

## 2015-01-12 MED ORDER — PRASUGREL HCL 10 MG PO TABS: 10.0000 mg | ORAL_TABLET | Freq: Every day | ORAL | Status: DC

## 2015-01-16 ENCOUNTER — Ambulatory Visit (INDEPENDENT_AMBULATORY_CARE_PROVIDER_SITE_OTHER): Payer: 59 | Admitting: Cardiology

## 2015-01-16 ENCOUNTER — Ambulatory Visit: Payer: 59

## 2015-01-16 VITALS — BP 122/78 | HR 78 | Ht 68.0 in | Wt 188.3 lb

## 2015-01-16 DIAGNOSIS — I2510 Atherosclerotic heart disease of native coronary artery without angina pectoris: Secondary | ICD-10-CM

## 2015-01-16 DIAGNOSIS — Z79899 Other long term (current) drug therapy: Secondary | ICD-10-CM

## 2015-01-16 DIAGNOSIS — Z955 Presence of coronary angioplasty implant and graft: Secondary | ICD-10-CM | POA: Diagnosis not present

## 2015-01-16 DIAGNOSIS — Z9861 Coronary angioplasty status: Secondary | ICD-10-CM

## 2015-01-16 DIAGNOSIS — E785 Hyperlipidemia, unspecified: Secondary | ICD-10-CM | POA: Diagnosis not present

## 2015-01-16 DIAGNOSIS — I214 Non-ST elevation (NSTEMI) myocardial infarction: Secondary | ICD-10-CM

## 2015-01-16 MED ORDER — NITROGLYCERIN 0.4 MG SL SUBL: 0.4000 mg | SUBLINGUAL_TABLET | SUBLINGUAL | Status: DC | PRN

## 2015-01-16 MED ORDER — CLOPIDOGREL BISULFATE 75 MG PO TABS: 75.0000 mg | ORAL_TABLET | Freq: Every day | ORAL | Status: DC

## 2015-01-16 NOTE — Progress Notes (Signed)
PCP: [name], MD  Clinic Note: Chief Complaint  Patient presents with  . Chest Pain    pt states some chest pain   . Shortness of Breath    some SOB no light headedness or dizziness, but states that sometimes when lifting his head up he gets a little dizzy   . Edema    some edema in both ankles    HPI: Albert Brown is a 59 y.o. male with a PMH below who presents today for annual follow-up of MI - PCI to RCA.  Alan Berkenpas Gunnarson was last seen in Oct 2015.  Recent Hospitalizations: none  Studies Reviewed: Only labs noted below.  Tired after full day of work - not enough "umph" to exercise.  Over last month, he has been trying to do better with getting some walking done at the end of the day -- walks maybe a mile.  Interval History: Occasionally may have chest off & on - not necessarily associated with activity.  Also some arm numbness.  Stopped pravastatin a few months ago because of leg fatigue and aching.  Occasionally feels lightheaded - & can get vertigo when leaning his head back - can get nauseated. He notes intermittent brief spells of sharp stabbing type pains in his chest, nothing like his anginal pain. He gets mostly tired more so than short of breath with  exertion No PND, orthopnea - mild LE edema, ankle Rare palpitations, weakness or  No syncope/near syncope, or TIA/amaurosis fugax symptoms.  ROS: A comprehensive was performed. Review of Systems  Constitutional: Negative for fever and malaise/fatigue.  HENT: Negative for nosebleeds.   Eyes: Positive for blurred vision.  Respiratory: Negative for cough.   Cardiovascular: Positive for leg swelling. Negative for claudication.  Gastrointestinal: Negative for constipation, blood in stool and melena.  Genitourinary: Negative for hematuria.  Musculoskeletal: Negative.   Neurological: Positive for dizziness (Per HPI). Negative for headaches.  Endo/Heme/Allergies: Bruises/bleeds easily (less of an issue since  stopping ASA ).  Psychiatric/Behavioral: The patient is nervous/anxious (normal for him).   All other systems reviewed and are negative.   Past Medical History  Diagnosis Date  . HIV infection ([place])   . EXTERNAL HEMORRHOIDS WITHOUT MENTION COMP   . ALLERGIC RHINITIS   . Allergic sinusitis   . Anxiety state, unspecified   . Cerumen impaction, right   . DEPRESSION   . Diarrhea   . Erectile dysfunction   . GERD   . HIV DISEASE   . HYPOGONADISM   . Periodontal disease   . PERIPHERAL NEUROPATHY   . PHARYNGITIS, RECURRENT   . Pterygium of left eye   . Urinary frequency   . VERTIGO   . Non-ST elevated myocardial infarction (non-STEMI) ([place]) 08/08/2013    SubAcute100% RCA -- PCI; Echo: EF 55-605, Normal LV Size & Function - ~mild HK of Inferior Myocardium with mildly reduced RV function.  Normal Valve function  . CAD S/P percutaneous coronary angioplasty 08/08/2013    mRCA  Xience Alpine DES 2.75 mm x 33 mm (3.0 mm)    Past Surgical History  Procedure Laterality Date  . Left heart catheterization with coronary angiogram N/A 08/08/2013    Procedure: LEFT HEART CATHETERIZATION WITH CORONARY ANGIOGRAM;  Surgeon: [name], MD;  Location[facility] CATH LAB;  Service: Cardiovascular;  Laterality: N/A;  . Percutaneous coronary stent intervention (pci-s)  08/08/2013    mRCA 100% --> Xience Alpine DES 2.75 mm x 33 mm --> 3.0 mm  Prior to Admission medications   Medication Sig Start Date End Date Taking? Authorizing Provider  ANDROGEL PUMP 20.25 MG/ACT (1.62%) GEL APPLY 2 PUMPS TO SKIN ONCE DAILY 10/17/14  Yes Campbell Riches, MD  aspirin 81 MG tablet Take 1 tablet (81 mg total) by mouth daily. 08/10/13  Yes Evelene Croon Barrett, PA-C  emtricitabine-rilpivir-tenofovir AF (ODEFSEY) 200-25-25 MG TABS tablet Take 1 tablet by mouth daily with breakfast. 12/26/14  Yes Campbell Riches, MD  Multiple Vitamin (MULTI VITAMIN MENS PO) Take 1 tablet by mouth daily.   Yes Historical Provider, MD    nitroGLYCERIN (NITROSTAT) 0.4 MG SL tablet Place 1 tablet (0.4 mg total) under the tongue every 5 (five) minutes as needed for chest pain. 08/10/13  Yes Rhonda G Barrett, PA-C  prasugrel (EFFIENT) 10 MG TABS tablet Take 1 tablet (10 mg total) by mouth daily. 01/12/15  Yes [name], MD  traZODone (DESYREL) 50 MG tablet Take 50 mg by mouth daily as needed for sleep.   Yes Historical Provider, MD   Allergies  Allergen Reactions  . Brilinta [Ticagrelor] Shortness Of Breath    BREATHING ISSUES  . Codeine Anaphylaxis  . Bactrim [Sulfamethoxazole-Trimethoprim]     "Pt stated he died when taking this"  . Ciprofloxacin     REACTION: rash  . Dapsone     REACTION: severe-anaphylaxis  . Pravastatin Other (See Comments)    Muscle cramps  . Sulfamethoxazole-Trimethoprim     Anaphylaxis     Social History   Social History  . Marital Status: Single    Spouse Name: N/A  . Number of Children: N/A  . Years of Education: N/A   Social History Main Topics  . Smoking status: Former Smoker    Types: Cigarettes    Quit date: 03/02/2007  . Smokeless tobacco: Never Used     Comment: pt. no longer smokes  . Alcohol Use: Yes     Comment: weekends  . Drug Use: No  . Sexual Activity: No     Comment: pt. refused condoms   Other Topics Concern  . None   Social History Narrative   Family History  Problem Relation Age of Onset  . Prostate cancer Father   . Prostate cancer Brother   . Prostate cancer Brother   . Cancer Brother   . Osteosarcoma Father   . Cancer Father     prostate  . Cancer Mother     brain tumor  . Rheum arthritis Mother     Wt Readings from Last 3 Encounters:  01/16/15 188 lb 4.8 oz (85.412 kg)  12/26/14 190 lb 1.9 oz (86.238 kg)  01/17/14 190 lb (86.183 kg)    PHYSICAL EXAM BP 122/78 mmHg  Pulse 78  Ht 5\' 8"  (1.727 m)  Wt 188 lb 4.8 oz (85.412 kg)  BMI 28.64 kg/m2 General appearance: alert, cooperative, appears stated age, no distress and borderline  obese HEENT: [place]/AT, EOMI, MMM, anicteric sclera Neck: no adenopathy, no carotid bruit and no JVD Lungs: clear to auscultation bilaterally, normal percussion bilaterally and non-labored Heart: regular rate and rhythm, S1, S2 normal, no murmur, click, rub or gallop; Non-displaced PMI Abdomen: soft, non-tender; bowel sounds normal; no masses,  no organomegaly; no HJR Extremities: extremities normal, atraumatic, no cyanosis, and edema  - Trace B ankle  Pulses: 2+ and symmetric; Skin: mobility and turgor normal Neurologic: Mental status: Alert, oriented, thought content appropriate Cranial nerves: normal (II-XII grossly intact)    Adult ECG Report  Rate:  78 ;  Rhythm: normal sinus rhythm; Normal Axis, intervals, durations  Narrative Interpretation: Normal EKG  Other studies Reviewed: Additional studies/ records that were reviewed today include:  Recent Labs:   Lab Results  Component Value Date   CHOL 138 08/17/2014   HDL 40 08/17/2014   LDLCALC 66 08/17/2014   TRIG 159* 08/17/2014   CHOLHDL 3.5 08/17/2014    ASSESSMENT / PLAN: Problem List Items Addressed This Visit    Presence of drug coated stent in right coronary artery (Chronic)    Converting from Effient the Plavix for recurrent all Effient. Will also be fine to stop aspirin as of now.      NSTEMI (non-ST elevated myocardial infarction) (HCC) (Chronic)    No return anginal symptoms or heart failure symptoms. Relatively preserved EF by echo.      Relevant Medications   nitroGLYCERIN (NITROSTAT) 0.4 MG SL tablet   Hyperlipidemia with target LDL less than 70 (Chronic)    He self discontinued his statin. Thankfully his lipid panel is not bad. His LDL from June was at goal - we can recheck now in December off of statin to see where he is. May need to consider alternative therapies.      Relevant Medications   clopidogrel (PLAVIX) 75 MG tablet   nitroGLYCERIN (NITROSTAT) 0.4 MG SL tablet   Other Relevant Orders   EKG  12-Lead (Completed)   Comprehensive metabolic panel   Lipid panel   CAD S/P percutaneous coronary angioplasty - Xience outline DES to mid RCA (2.75 mm x 33 mm --> 3.0 mm) - Primary (Chronic)    Large stent in place. Currently on Effient. Okay to convert from Effient to Plavix once his current prescription is completed. Okay to DC aspirin. Not currently on any beta blocker because of fatigue.  Not on ACE inhibitor/ARB because of borderline blood pressures in the past and dizziness. He quit taking pravastatin because of discomfort. -- we'll need to reassess lipid management.      Relevant Medications   clopidogrel (PLAVIX) 75 MG tablet   nitroGLYCERIN (NITROSTAT) 0.4 MG SL tablet   Other Relevant Orders   EKG 12-Lead (Completed)   Comprehensive metabolic panel   Lipid panel    Other Visit Diagnoses    Drug therapy        Relevant Medications    clopidogrel (PLAVIX) 75 MG tablet    Other Relevant Orders    EKG 12-Lead (Completed)    Comprehensive metabolic panel    Lipid panel       Current medicines are reviewed at length with the patient today. (+/- concerns) Cost of Effient? The following changes have been made: After current bottle of Effient - d/c & convert to Plavix 75 mg daily.  D/c ASA.  Studies Ordered:   Orders Placed This Encounter  Procedures  . Comprehensive metabolic panel  . Lipid panel  . EKG 12-Lead    ROV 1 yr   [name], M.D., M.S. Interventional Cardiologist   Pager # [phone]

## 2015-01-16 NOTE — Patient Instructions (Addendum)
Your physician wants you to follow-up in [place].  You will receive a reminder letter in the mail two months in advance. If you don't receive a letter, please call our office to schedule the follow-up appointment.   LABS- CMP , LIPID IN DEC 2016  STOP EFFIENT AFTER THE BOTTLE COMPLETED THEN STATS CLOPIDOGREL 75 MG ONCE A DAY  STOP ASPIRIN  If you need a refill on your cardiac medications before your next appointment, please call your pharmacy.

## 2015-01-17 ENCOUNTER — Encounter: Payer: Self-pay | Admitting: Cardiology

## 2015-01-17 NOTE — Assessment & Plan Note (Signed)
No return anginal symptoms or heart failure symptoms. Relatively preserved EF by echo.

## 2015-01-17 NOTE — Assessment & Plan Note (Signed)
Converting from Effient the Plavix for recurrent all Effient. Will also be fine to stop aspirin as of now.

## 2015-01-17 NOTE — Assessment & Plan Note (Signed)
He self discontinued his statin. Thankfully his lipid panel is not bad. His LDL from June was at goal - we can recheck now in December off of statin to see where he is. May need to consider alternative therapies.

## 2015-01-17 NOTE — Assessment & Plan Note (Signed)
Large stent in place. Currently on Effient. Okay to convert from Effient to Plavix once his current prescription is completed. Okay to DC aspirin. Not currently on any beta blocker because of fatigue.  Not on ACE inhibitor/ARB because of borderline blood pressures in the past and dizziness. He quit taking pravastatin because of discomfort. -- we'll need to reassess lipid management.

## 2015-01-24 ENCOUNTER — Ambulatory Visit: Payer: 59

## 2015-01-24 DIAGNOSIS — F329 Major depressive disorder, single episode, unspecified: Secondary | ICD-10-CM

## 2015-01-24 DIAGNOSIS — F32A Depression, unspecified: Secondary | ICD-10-CM

## 2015-01-24 LAB — COMPREHENSIVE METABOLIC PANEL
ALBUMIN: 4.2 g/dL (ref 3.6–5.1)
ALT: 24 U/L (ref 9–46)
AST: 19 U/L (ref 10–35)
Alkaline Phosphatase: 90 U/L (ref 40–115)
BUN: 17 mg/dL (ref 7–25)
CALCIUM: 9.4 mg/dL (ref 8.6–10.3)
CHLORIDE: 107 mmol/L (ref 98–110)
CO2: 26 mmol/L (ref 20–31)
CREATININE: 1.06 mg/dL (ref 0.70–1.33)
Glucose, Bld: 95 mg/dL (ref 65–99)
Potassium: 5.2 mmol/L (ref 3.5–5.3)
SODIUM: 140 mmol/L (ref 135–146)
TOTAL PROTEIN: 7 g/dL (ref 6.1–8.1)
Total Bilirubin: 0.3 mg/dL (ref 0.2–1.2)

## 2015-01-24 LAB — LIPID PANEL
CHOLESTEROL: 162 mg/dL (ref 125–200)
HDL: 49 mg/dL (ref >=40)
LDL Cholesterol: 103 mg/dL (ref <130)
TRIGLYCERIDES: 49 mg/dL (ref <150)
Total CHOL/HDL Ratio: 3.3 Ratio (ref <=5.0)
VLDL: 10 mg/dL (ref <30)

## 2015-01-24 NOTE — BH Specialist Note (Signed)
Albert Brown was in a pleasant mood today, reporting that he has the week off and is enjoying getting some things done.  He talked again about relationships and his frustration with not finding anyone.  He also talked about his ex-boyfriend, Albert Brown, who still comes around sometimes.  He said he recently told Albert Brown that he cannot move back in.  I praised him for setting this limit.  He said he used to go to support groups at [employer], but didn't find it very helpful, feeling that he didn't relate to the people there.  Plan to meet again in one week. [name], LCSW

## 2015-01-30 ENCOUNTER — Ambulatory Visit: Payer: 59

## 2015-02-01 ENCOUNTER — Ambulatory Visit: Payer: 59

## 2015-02-02 ENCOUNTER — Telehealth: Payer: Self-pay | Admitting: *Deleted

## 2015-02-02 NOTE — Telephone Encounter (Signed)
Spoke to patient. Result given . Verbalized understanding Appointment schedule 03/07/15 at 3:30 pm Patient aware.

## 2015-02-02 NOTE — Telephone Encounter (Signed)
Albert Brown returned your call, and ask that you call him back after 2:30 when he gets off work .Marland Kitchen Thanks

## 2015-02-02 NOTE — Telephone Encounter (Signed)
LEFT MESSAGE TO CALL BACK NEED APPOINTMENT WITH LIPID CLINIC

## 2015-02-02 NOTE — Telephone Encounter (Signed)
-----   Message from [name], MD sent at 01/30/2015  6:04 PM EST ----- Chemistries, renal function and liver function looks stable. Unfortunately, the cholesterol levels look worse having stopped the statin. LDL was outstanding 5 months ago, and is now over 100. HDL is stable.  We need to talk about different options to treat lipids.  I will refer to the clinic, to determine if he could potentially start low-dose statin of a different type as well as potentially less frequent dosing. I would like to get the ball rolling to see if he maybe meets criteria for PCSK9.    Albert Brown

## 2015-02-06 ENCOUNTER — Ambulatory Visit: Payer: 59

## 2015-02-06 DIAGNOSIS — F329 Major depressive disorder, single episode, unspecified: Secondary | ICD-10-CM

## 2015-02-06 DIAGNOSIS — F32A Depression, unspecified: Secondary | ICD-10-CM

## 2015-02-06 NOTE — BH Specialist Note (Signed)
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  He said he tries to just laugh it off when they are difficult and I said that's a good way to deal with it.  He talked some about his depression and said he is walking every night, even in the recent cold weather, to combat it. I praised him for this.  He said he doesn't want to go on medication for it unless he absolutely has to.  Plan to meet again in one week. Albert Spice, LCSW

## 2015-02-13 ENCOUNTER — Ambulatory Visit: Payer: 59

## 2015-02-13 DIAGNOSIS — F32A Depression, unspecified: Secondary | ICD-10-CM

## 2015-02-13 DIAGNOSIS — F329 Major depressive disorder, single episode, unspecified: Secondary | ICD-10-CM

## 2015-02-13 NOTE — BH Specialist Note (Signed)
Albert Brown was pleasant today and talked about work and other stressors.  He talked about how he would like to move to another area of town, but likes his house and has a good landlord.  He also talked about his health some and his struggle with eating a healthy diet. Plan to meet again next week. Albert Spice, LCSW

## 2015-02-23 ENCOUNTER — Ambulatory Visit: Payer: 59

## 2015-02-23 DIAGNOSIS — F32A Depression, unspecified: Secondary | ICD-10-CM

## 2015-02-23 DIAGNOSIS — F329 Major depressive disorder, single episode, unspecified: Secondary | ICD-10-CM
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
## 2015-02-28 ENCOUNTER — Ambulatory Visit: Payer: 59

## 2015-02-28 DIAGNOSIS — F32A Depression, unspecified: Secondary | ICD-10-CM

## 2015-02-28 DIAGNOSIS — F329 Major depressive disorder, single episode, unspecified: Secondary | ICD-10-CM
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
## 2015-03-07 ENCOUNTER — Ambulatory Visit: Payer: 59 | Admitting: Pharmacist Clinician (PhC)/ Clinical Pharmacy Specialist

## 2015-03-09 ENCOUNTER — Ambulatory Visit: Payer: 59

## 2015-03-09 NOTE — BH Specialist Note (Signed)
Albert Brown was in a good mood today.  He told a story of how a lady went out of her way to help him at [facility] recently and so later he called the corporate office to report how she went above and beyond.  This made him feel good, he said, knowing she would be appreciated by her employer.  I praised him for doing this.  He talked some about his fear of change and how he wants to find another job, but is afraid to get out there and look for one.  Plan to meet again in one week. Albert Spice, LCSW

## 2015-03-14 ENCOUNTER — Ambulatory Visit: Payer: BLUE CROSS/BLUE SHIELD

## 2015-03-14 DIAGNOSIS — F32A Depression, unspecified: Secondary | ICD-10-CM

## 2015-03-14 DIAGNOSIS — F329 Major depressive disorder, single episode, unspecified: Secondary | ICD-10-CM
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
## 2015-03-22 ENCOUNTER — Ambulatory Visit: Payer: BLUE CROSS/BLUE SHIELD

## 2015-03-22 DIAGNOSIS — F329 Major depressive disorder, single episode, unspecified: Secondary | ICD-10-CM

## 2015-03-22 DIAGNOSIS — F32A Depression, unspecified: Secondary | ICD-10-CM

## 2015-03-22 NOTE — BH Specialist Note (Signed)
Albert Brown was dressed in a nylon workout suit and reported that he is planning to join the [employer].  He said he has decided that in 2017 he is going to "do things differently".  I praised him for this.  He said he is also considering putting in an application to [employer], which is something he has talked about before.  I encouraged him to do this.  Plan to meet again in one week. [name], LCSW

## 2015-03-29 ENCOUNTER — Ambulatory Visit: Payer: BLUE CROSS/BLUE SHIELD

## 2015-04-05 ENCOUNTER — Ambulatory Visit: Payer: BLUE CROSS/BLUE SHIELD

## 2015-04-12 ENCOUNTER — Ambulatory Visit: Payer: BLUE CROSS/BLUE SHIELD

## 2015-04-12 DIAGNOSIS — F32A Depression, unspecified: Secondary | ICD-10-CM

## 2015-04-12 DIAGNOSIS — F329 Major depressive disorder, single episode, unspecified: Secondary | ICD-10-CM

## 2015-04-12 NOTE — BH Specialist Note (Signed)
Albert Brown was in a pleasant mood today, but reported that a customer complained to his boss about his service. He also said he has a head cold and has been having problems getting his medication from the insurance company. He said he feels like things come "in 3's" and it makes it hard.  I validated that it must be frustrating when several things come at once.  Later he said that therapy has been very helpful to him and he appreciates coming in to talk about things.  Plan to meet again in one week. [name], LCSW

## 2015-04-19 ENCOUNTER — Ambulatory Visit: Payer: BLUE CROSS/BLUE SHIELD

## 2015-04-19 DIAGNOSIS — F32A Depression, unspecified: Secondary | ICD-10-CM

## 2015-04-19 DIAGNOSIS — F329 Major depressive disorder, single episode, unspecified: Secondary | ICD-10-CM

## 2015-04-19 NOTE — BH Specialist Note (Signed)
Albert Brown reported that he called [employer] to report that he had gotten a raise at his job and they told him his insurance will go up from $67/month to $133/month, which upset him.  He said he is having anxiety about it.  He also is frustrated that they keep cutting his hours to 37 or 38 a week.  He talked to his boss about it and got 40 this week.  I mentioned driving for [place] and he is considering it, but is very meticulous about his car.  Overall, his mood was good today.  Plan to meet again in one week. [name], LCSW

## 2015-04-26 ENCOUNTER — Ambulatory Visit: Payer: BLUE CROSS/BLUE SHIELD

## 2015-05-03 ENCOUNTER — Ambulatory Visit: Payer: BLUE CROSS/BLUE SHIELD

## 2015-05-03 DIAGNOSIS — F32A Depression, unspecified: Secondary | ICD-10-CM

## 2015-05-03 DIAGNOSIS — F329 Major depressive disorder, single episode, unspecified: Secondary | ICD-10-CM
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
## 2015-05-10 ENCOUNTER — Ambulatory Visit: Payer: BLUE CROSS/BLUE SHIELD

## 2015-05-10 DIAGNOSIS — F32A Depression, unspecified: Secondary | ICD-10-CM

## 2015-05-10 DIAGNOSIS — F329 Major depressive disorder, single episode, unspecified: Secondary | ICD-10-CM

## 2015-05-10 NOTE — BH Specialist Note (Signed)
Rob was in a pleasant mood today, reporting that he saw his former partner/roommate last week and had him move some things out of his house.  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  Otherwise, they wouldn't have discovered this and it is a system wide problem.  This made him feel good and he said they will be reimbursing him.  Plan to meet again in one week. [name], LCSW

## 2015-05-18 ENCOUNTER — Ambulatory Visit: Payer: BLUE CROSS/BLUE SHIELD

## 2015-05-18 DIAGNOSIS — F32A Depression, unspecified: Secondary | ICD-10-CM

## 2015-05-18 DIAGNOSIS — F329 Major depressive disorder, single episode, unspecified: Secondary | ICD-10-CM

## 2015-05-18 NOTE — BH Specialist Note (Signed)
Rob reported that he finally figured out why he was not getting a full 40 hours of pay and that it wasn't the company's fault, like he thought before.  He has rectified the problem and feels better about it.  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  He said the man (who was intoxicated) ended up saying he wished he could "hang out" with him sometime.  He struggles with self esteem issues, but sometimes finds good qualities he can acknowledge.  Plan to meet again in one week. [name], LCSW

## 2015-05-25 ENCOUNTER — Other Ambulatory Visit: Payer: BLUE CROSS/BLUE SHIELD

## 2015-05-25 ENCOUNTER — Ambulatory Visit: Payer: BLUE CROSS/BLUE SHIELD

## 2015-05-25 ENCOUNTER — Telehealth: Payer: Self-pay | Admitting: *Deleted

## 2015-05-25 ENCOUNTER — Other Ambulatory Visit: Payer: Self-pay | Admitting: Infectious Diseases

## 2015-05-25 DIAGNOSIS — F329 Major depressive disorder, single episode, unspecified: Secondary | ICD-10-CM

## 2015-05-25 DIAGNOSIS — Z79899 Other long term (current) drug therapy: Secondary | ICD-10-CM

## 2015-05-25 DIAGNOSIS — B20 Human immunodeficiency virus [HIV] disease: Secondary | ICD-10-CM

## 2015-05-25 DIAGNOSIS — F32A Depression, unspecified: Secondary | ICD-10-CM

## 2015-05-25 DIAGNOSIS — Z113 Encounter for screening for infections with a predominantly sexual mode of transmission: Secondary | ICD-10-CM

## 2015-05-25 DIAGNOSIS — R7989 Other specified abnormal findings of blood chemistry: Secondary | ICD-10-CM

## 2015-05-25 LAB — LIPID PANEL
CHOL/HDL RATIO: 4.3 ratio (ref <=5.0)
Cholesterol: 181 mg/dL (ref 125–200)
HDL: 42 mg/dL (ref >=40)
LDL Cholesterol: 111 mg/dL (ref <130)
Triglycerides: 138 mg/dL (ref <150)
VLDL: 28 mg/dL (ref <30)

## 2015-05-25 LAB — CBC
HCT: 44.6 % (ref 39.0–52.0)
HEMOGLOBIN: 15.9 g/dL (ref 13.0–17.0)
MCH: 32.3 pg (ref 26.0–34.0)
MCHC: 35.7 g/dL (ref 30.0–36.0)
MCV: 90.7 fL (ref 78.0–100.0)
MPV: 10.1 fL (ref 8.6–12.4)
Platelets: 159 10*3/uL (ref 150–400)
RBC: 4.92 MIL/uL (ref 4.22–5.81)
RDW: 13 % (ref 11.5–15.5)
WBC: 6.5 10*3/uL (ref 4.0–10.5)

## 2015-05-25 LAB — COMPREHENSIVE METABOLIC PANEL
ALBUMIN: 4.4 g/dL (ref 3.6–5.1)
ALK PHOS: 81 U/L (ref 40–115)
ALT: 23 U/L (ref 9–46)
AST: 20 U/L (ref 10–35)
BILIRUBIN TOTAL: 0.5 mg/dL (ref 0.2–1.2)
BUN: 22 mg/dL (ref 7–25)
CO2: 25 mmol/L (ref 20–31)
CREATININE: 1.1 mg/dL (ref 0.70–1.33)
Calcium: 9.6 mg/dL (ref 8.6–10.3)
Chloride: 103 mmol/L (ref 98–110)
Glucose, Bld: 84 mg/dL (ref 65–99)
Potassium: 4.6 mmol/L (ref 3.5–5.3)
SODIUM: 138 mmol/L (ref 135–146)
TOTAL PROTEIN: 7 g/dL (ref 6.1–8.1)

## 2015-05-25 NOTE — BH Specialist Note (Signed)
Albert Brown was in a fairly good mood today but talked about his frustration with not getting his testosterone gel.  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  He also talked about his job and his mixed emotions about working there.  He said he enjoys it most of the time, but sometimes wishes he could make more money.  He does pride himself on how good he is with customers.  Plan to meet again next week. [name], LCSW

## 2015-05-25 NOTE — Telephone Encounter (Signed)
Patient came today for an appt with [name], counselor and stated he is having difficulty getting his testosterone medication. His insurance has changed and they are now saying he needs a prior authorization. As patient does not have a recent testosterone lab that insurance requires; he got lab done today. Advised him that we have not received the PA and to please call his pharmacy requesting they fax it to [facility]. Myrtis Hopping

## 2015-05-26 LAB — T-HELPER CELL (CD4) - (RCID CLINIC ONLY)
CD4 % Helper T Cell: 35 % (ref 33–55)
CD4 T Cell Abs: 770 /uL (ref 400–2700)

## 2015-05-26 LAB — TESTOSTERONE: TESTOSTERONE: 260 ng/dL (ref 250–827)

## 2015-05-26 LAB — RPR

## 2015-05-27 ENCOUNTER — Other Ambulatory Visit: Payer: Self-pay | Admitting: Infectious Diseases

## 2015-05-29 LAB — HIV-1 RNA QUANT-NO REFLEX-BLD
HIV 1 RNA Quant: <20 copies/mL (ref <20)
HIV-1 RNA Quant, Log: <1.3 Log copies/mL (ref <1.30)

## 2015-05-30 ENCOUNTER — Ambulatory Visit: Payer: BLUE CROSS/BLUE SHIELD

## 2015-06-08 ENCOUNTER — Telehealth: Payer: Self-pay | Admitting: *Deleted

## 2015-06-08 ENCOUNTER — Ambulatory Visit: Payer: BLUE CROSS/BLUE SHIELD

## 2015-06-08 DIAGNOSIS — F329 Major depressive disorder, single episode, unspecified: Secondary | ICD-10-CM

## 2015-06-08 DIAGNOSIS — F32A Depression, unspecified: Secondary | ICD-10-CM

## 2015-06-08 NOTE — Telephone Encounter (Signed)
Patient came in for appt with Albert Brown and asked about his androgel prior authorization. He did a testosterone lab and stated he was off the androgel. Please review lab and advise on new Rx for testosterone. Myrtis Hopping

## 2015-06-08 NOTE — BH Specialist Note (Signed)
Albert Brown talked about his regrets over the years and I pointed out how he beats himself up.  I asked him what he can do about the past and he admitted that the answer is "nothing". We talked about doing something different, such as EMDR, to make some progress, instead of not dealing with things.  Plan to meet again in one week. Albert Spice, LCSW

## 2015-06-09 ENCOUNTER — Other Ambulatory Visit: Payer: Self-pay | Admitting: Internal Medicine

## 2015-06-09 NOTE — Telephone Encounter (Signed)
I note that Dr. Johnnye Sima refilled his AndroGel on 05/29/2015 with 5 refills.

## 2015-06-15 ENCOUNTER — Other Ambulatory Visit: Payer: Self-pay | Admitting: Infectious Diseases

## 2015-06-15 ENCOUNTER — Ambulatory Visit: Payer: BLUE CROSS/BLUE SHIELD

## 2015-06-15 DIAGNOSIS — R7989 Other specified abnormal findings of blood chemistry: Secondary | ICD-10-CM

## 2015-06-15 DIAGNOSIS — F32A Depression, unspecified: Secondary | ICD-10-CM

## 2015-06-15 DIAGNOSIS — F329 Major depressive disorder, single episode, unspecified: Secondary | ICD-10-CM

## 2015-06-15 NOTE — BH Specialist Note (Signed)
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  He said he is not looking forward to it, so I tried to point out the positives of this and suggested that he may actually enjoy himself.  I also pointed out that they will be paying him for a day where he doesn't have to work. He talked about his desire for a relationship and said he almost started an account on [email or web address], but stopped when he thought about having to disclose to someone that he is HIV positive.  He said he would rather go on a site like http://schultz.biz/ where it is understood. He talked about how he gets negative sometimes so I suggested a Youtube video of a "TED talk" on self hypnosis that encourages positive self talk.  He agreed to give this a listen.  Plan to meet next week. [name], LCSW

## 2015-06-20 ENCOUNTER — Ambulatory Visit: Payer: BLUE CROSS/BLUE SHIELD

## 2015-06-20 DIAGNOSIS — F329 Major depressive disorder, single episode, unspecified: Secondary | ICD-10-CM

## 2015-06-20 DIAGNOSIS — F32A Depression, unspecified: Secondary | ICD-10-CM

## 2015-06-20 NOTE — BH Specialist Note (Signed)
Tu reported that he continues to struggle with his ex partner, who still wants to be in a relationship with him, despite Rob's insistence that he is not in love with him anymore.  He did say he met someone at the park last weekend and they talked for an hour or more.  He said he was attracted to this man, but is afraid to follow up with it.  I encouraged him to give this man a chance, since he has been saying for some time that he wishes he could meet someone. Otherwise, he seems to be doing well.  Plan to meet again in one week. [name], LCSW

## 2015-06-21 NOTE — Telephone Encounter (Signed)
He has to have one of these diagnoses, low testosterone, inoperable breast cancer, or delayed puberty. Or gender identity disorder, which states he wishes to live as the opposite sex. They are requesting a lab result that shows a low testosterone level.

## 2015-06-21 NOTE — Telephone Encounter (Signed)
Patient's insurance needs a testosterone level (they have denied this Rx) stating it needs a prior authorization. His last testosterone level was normal off and he had been off the androgel. Please advise on continuing the testosterone.

## 2015-06-21 NOTE — Telephone Encounter (Signed)
His last testosterone level was normal.  What other requirements does the insurance company have?

## 2015-06-22 ENCOUNTER — Ambulatory Visit: Payer: BLUE CROSS/BLUE SHIELD

## 2015-06-29 ENCOUNTER — Ambulatory Visit: Payer: BLUE CROSS/BLUE SHIELD

## 2015-06-29 DIAGNOSIS — F329 Major depressive disorder, single episode, unspecified: Secondary | ICD-10-CM

## 2015-06-29 DIAGNOSIS — F32A Depression, unspecified: Secondary | ICD-10-CM

## 2015-06-29 NOTE — BH Specialist Note (Signed)
Albert Brown was in a pleasant mood today, reporting that he had a good time at work today joking with customers.  He said he enjoys doing that because it makes his day go by faster and makes him feel like he is making people smile.  I praised him for doing this. He had few complaints today and talked about his interest in decorating and collecting things. Overall, he is doing well.  Plan to meet again in 2 weeks. [name], LCSW

## 2015-07-04 ENCOUNTER — Telehealth: Payer: Self-pay | Admitting: *Deleted

## 2015-07-04 ENCOUNTER — Other Ambulatory Visit: Payer: Self-pay | Admitting: Infectious Diseases

## 2015-07-04 ENCOUNTER — Other Ambulatory Visit: Payer: BLUE CROSS/BLUE SHIELD

## 2015-07-04 DIAGNOSIS — B20 Human immunodeficiency virus [HIV] disease: Secondary | ICD-10-CM

## 2015-07-04 DIAGNOSIS — E291 Testicular hypofunction: Secondary | ICD-10-CM

## 2015-07-04 DIAGNOSIS — Z8042 Family history of malignant neoplasm of prostate: Secondary | ICD-10-CM

## 2015-07-04 NOTE — Telephone Encounter (Signed)
Walk-in to [facility] to speak to RN in conjunction with Lab Visit.  Pt shared that he was told to come in first thing in the morning to have a "testosterone level" drawn.  There were no orders for this test in the patient's chart.  The patient also asked to have a "PSA" drawn due to a "strong" family history of "testicular cancer."  RN shared that there were no orders available for these tests in the patient's chart and that Dr. Johnnye Sima would need to be consulted about these tests.  Patient believes that he needs to continue testosterone replacement.  These orders will need to be added to the blood draw done this morning.  Dr. Johnnye Sima please advise.

## 2015-07-05 LAB — COMPREHENSIVE METABOLIC PANEL
ALK PHOS: 79 U/L (ref 40–115)
ALT: 20 U/L (ref 9–46)
AST: 18 U/L (ref 10–35)
Albumin: 4.2 g/dL (ref 3.6–5.1)
BILIRUBIN TOTAL: 0.4 mg/dL (ref 0.2–1.2)
BUN: 19 mg/dL (ref 7–25)
CALCIUM: 9.1 mg/dL (ref 8.6–10.3)
CO2: 25 mmol/L (ref 20–31)
Chloride: 109 mmol/L (ref 98–110)
Creat: 1.1 mg/dL (ref 0.70–1.33)
Glucose, Bld: 96 mg/dL (ref 65–99)
POTASSIUM: 4.5 mmol/L (ref 3.5–5.3)
Sodium: 142 mmol/L (ref 135–146)
TOTAL PROTEIN: 6.6 g/dL (ref 6.1–8.1)

## 2015-07-05 LAB — CBC
HEMATOCRIT: 48.2 % (ref 38.5–50.0)
Hemoglobin: 15.4 g/dL (ref 13.2–17.1)
MCH: 30.7 pg (ref 27.0–33.0)
MCHC: 32 g/dL (ref 32.0–36.0)
MCV: 96 fL (ref 80.0–100.0)
MPV: 10.7 fL (ref 7.5–12.5)
PLATELETS: 163 10*3/uL (ref 140–400)
RBC: 5.02 MIL/uL (ref 4.20–5.80)
RDW: 13.4 % (ref 11.0–15.0)
WBC: 4.8 10*3/uL (ref 3.8–10.8)

## 2015-07-05 LAB — HIV-1 RNA QUANT-NO REFLEX-BLD: HIV 1 RNA Quant: <20 copies/mL (ref <20)

## 2015-07-05 LAB — T-HELPER CELL (CD4) - (RCID CLINIC ONLY)
CD4 % Helper T Cell: 32 % — ABNORMAL LOW (ref 33–55)
CD4 T CELL ABS: 480 /uL (ref 400–2700)

## 2015-07-05 NOTE — Progress Notes (Unsigned)
Received verbal order from Dr. Page Spiro to include testosterone and PSA labs.  Added to orders.

## 2015-07-06 LAB — TESTOSTERONE: TESTOSTERONE: 291 ng/dL (ref 250–827)

## 2015-07-07 LAB — PSA, TOTAL AND FREE
PSA FREE PCT: 19 % — AB (ref >25)
PSA FREE: 0.72 ng/mL
PSA: 3.81 ng/mL (ref <=4.00)

## 2015-07-13 ENCOUNTER — Ambulatory Visit: Payer: BLUE CROSS/BLUE SHIELD

## 2015-07-13 DIAGNOSIS — F329 Major depressive disorder, single episode, unspecified: Secondary | ICD-10-CM

## 2015-07-13 DIAGNOSIS — F32A Depression, unspecified: Secondary | ICD-10-CM
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
## 2015-07-20 ENCOUNTER — Ambulatory Visit: Payer: BLUE CROSS/BLUE SHIELD

## 2015-07-20 DIAGNOSIS — F32A Depression, unspecified: Secondary | ICD-10-CM

## 2015-07-20 DIAGNOSIS — F329 Major depressive disorder, single episode, unspecified: Secondary | ICD-10-CM

## 2015-07-20 NOTE — BH Specialist Note (Signed)
Albert Brown was in a pleasant mood today.  He talked about his anger - especially his "road rage" - and we discussed what could be contributing to his impatience with people. He did say he has to deal with all types of people at his job and sometimes they get on his nerves. He also said his ex partner recently tried to get into an argument with him and then said he was moving to [place] and wasn't going to call him again. Albert Brown said he wishes that were true, but imagines that he will be back eventually. Plan to meet again in one week. [name], LCSW

## 2015-07-27 ENCOUNTER — Ambulatory Visit: Payer: BLUE CROSS/BLUE SHIELD

## 2015-08-03 ENCOUNTER — Ambulatory Visit: Payer: BLUE CROSS/BLUE SHIELD

## 2015-08-03 DIAGNOSIS — F329 Major depressive disorder, single episode, unspecified: Secondary | ICD-10-CM

## 2015-08-03 DIAGNOSIS — F32A Depression, unspecified: Secondary | ICD-10-CM

## 2015-08-03 NOTE — BH Specialist Note (Signed)
Albert Brown talked about how he is tired of being alone, but he doesn't know where to meet someone who's interested in a relationship and not a "hook up". He said he would like to go to a local church that he has heard is very accepting of gays, but he would feel guilty if he meets someone there, because he would feel like he used church to meet up with someone.  I told him that if he meets someone at a church, it is probably better than meeting someone at a bar. I looked up information online about an agency in town [facility]) that puts on LGBTQ events.  I have also told him about our support group meetings here.  Plan to meet again in one week. [name], LCSW

## 2015-08-10 ENCOUNTER — Ambulatory Visit: Payer: BLUE CROSS/BLUE SHIELD

## 2015-08-10 DIAGNOSIS — F32A Depression, unspecified: Secondary | ICD-10-CM

## 2015-08-10 DIAGNOSIS — F329 Major depressive disorder, single episode, unspecified: Secondary | ICD-10-CM

## 2015-08-10 NOTE — BH Specialist Note (Signed)
Rob reported again today that he continues to notice his anger is increasing.  He gets easily frustrated with customers (he works at a [employer]) and gets angry in traffic, he said.  I asked about antidepressants but he doesn't want to do that, so we talked about exercise.  He agreed to start exercising more regularly again.  He asked about valerian and St. John's Wort, so I told him that valerian is a sleep aid and that Rosalia can interfere with HIV medication, so he cannot take it. I then talked to him about mindfulness meditation and reviewed the basics of it and showed him the Insight Timer meditation app for his phone and he said he would try doing 5 minutes every morning.  Plan to meet again in one week. [name], LCSW

## 2015-08-17 ENCOUNTER — Ambulatory Visit: Payer: BLUE CROSS/BLUE SHIELD

## 2015-08-17 DIAGNOSIS — F329 Major depressive disorder, single episode, unspecified: Secondary | ICD-10-CM

## 2015-08-17 DIAGNOSIS — F32A Depression, unspecified: Secondary | ICD-10-CM

## 2015-08-17 NOTE — BH Specialist Note (Signed)
Albert Brown was in a pleasant mood today but continues to mentally "beat himself up", saying negative things to himself.  We talked about what he wants and he said he wishes he could travel.  So when I brought up ways to make that happen he referred to it in the past tense and I pointed out that his life isn't over - that he is only 60 years old, but he is talking like he has no options. He said he feels like there is a big wall blocking him from ever doing the things he wants to do.  We agreed to talk more about this next week. Albert Spice, LCSW

## 2015-08-24 ENCOUNTER — Ambulatory Visit: Payer: BLUE CROSS/BLUE SHIELD

## 2015-08-31 ENCOUNTER — Ambulatory Visit: Payer: BLUE CROSS/BLUE SHIELD

## 2015-09-07 ENCOUNTER — Ambulatory Visit: Payer: BLUE CROSS/BLUE SHIELD

## 2015-09-21 ENCOUNTER — Ambulatory Visit: Payer: BLUE CROSS/BLUE SHIELD

## 2015-09-21 DIAGNOSIS — F331 Major depressive disorder, recurrent, moderate: Secondary | ICD-10-CM

## 2015-09-21 NOTE — BH Specialist Note (Signed)
Albert Brown reported that he has had several incidents recently that upset him and he has trouble calming down from them.  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  He had a conflict with her one day and so he talked to the regional manager about her, which seemed to help some.  I praised him for talking to him about this issue.  I encouraged him to work on letting things go and not let her or other negative people get to him in a lasting way. Plan to outline ways he can calm himself down next session. Albert Spice, LCSW

## 2015-09-28 ENCOUNTER — Ambulatory Visit: Payer: BLUE CROSS/BLUE SHIELD

## 2015-09-28 DIAGNOSIS — F331 Major depressive disorder, recurrent, moderate: Secondary | ICD-10-CM

## 2015-09-28 NOTE — BH Specialist Note (Signed)
Oji was in a pleasant mood today and reported that he made an effort to befriend a new coworker who was a bit rude on her first couple of days.  He said he even gave her some ears of corn he had gotten and said this warmed her up to him.  I praised him for making such an effort and was glad it paid off.  He talked about a niece of his who is transgender and how he doesn't understand why she wants to be with a man, but wants to BE a man.  I played "devil's advocate" with him some, saying it really is her decision and no one else's.  He agreed, but insisted that he has the right to question it.  Overall, his mood seems to be good.  Plan to meet again in one week. [name], LCSW

## 2015-10-05 ENCOUNTER — Ambulatory Visit: Payer: BLUE CROSS/BLUE SHIELD

## 2015-10-05 DIAGNOSIS — F330 Major depressive disorder, recurrent, mild: Secondary | ICD-10-CM

## 2015-10-05 NOTE — BH Specialist Note (Signed)
Rob reported that he was tired today, but explained that he had mowed his neighbor's yard yesterday and it was exhausting. 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  I pointed out that he has told me in the past that the regional manager really thinks a lot of him and he agreed.  I said this was a good thing and gives him some job Land. He talked about his strong work ethic and how he got that from his father. I pointed out that given what he has said about him, his father sounds like he was a very good man.  He agreed.  Plan to meet again in one week. [name], LCSW

## 2015-10-12 ENCOUNTER — Ambulatory Visit: Payer: BLUE CROSS/BLUE SHIELD

## 2015-10-12 DIAGNOSIS — F330 Major depressive disorder, recurrent, mild: Secondary | ICD-10-CM
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
## 2015-10-19 ENCOUNTER — Ambulatory Visit: Payer: BLUE CROSS/BLUE SHIELD

## 2015-10-19 DIAGNOSIS — F331 Major depressive disorder, recurrent, moderate: Secondary | ICD-10-CM

## 2015-10-19 NOTE — BH Specialist Note (Signed)
Albert Brown came in today reporting that he is tired of being alone and has been trying to set up a profile page on the [email or web address] website, so that he can meet other men who are HIV+.  He was having trouble so we went on the computer and I helped him get it set up.  I also helped him upload a photo from his phone for a profile. He thanked me for helping him do this. I also talked to him about the benefits of meditation and helped him upload a meditation app to his smart phone so that he would have access to guided meditations. Plan to meet again in one week. Albert Spice, LCSW

## 2015-10-26 ENCOUNTER — Ambulatory Visit: Payer: BLUE CROSS/BLUE SHIELD

## 2015-10-26 DIAGNOSIS — F330 Major depressive disorder, recurrent, mild: Secondary | ICD-10-CM

## 2015-10-31 NOTE — BH Specialist Note (Signed)
Albert Brown was frustrated today, reporting that he is ready to do something to change his life.  He said he had tried to set up a page on [email or web address]/, to meet guys for a relationship without having to worry about telling them about his HIV status. He asked for some help, so I was able to help him set up his profile page, which he appreciated. Then we talked some about meditation and he agreed to give it a try.  Plan to provide more psycho-education on mindfulness next session, next week. [name], LCSW

## 2015-11-02 ENCOUNTER — Ambulatory Visit: Payer: BLUE CROSS/BLUE SHIELD

## 2015-11-02 DIAGNOSIS — F331 Major depressive disorder, recurrent, moderate: Secondary | ICD-10-CM
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
## 2015-11-09 ENCOUNTER — Ambulatory Visit: Payer: BLUE CROSS/BLUE SHIELD

## 2015-11-09 DIAGNOSIS — F331 Major depressive disorder, recurrent, moderate: Secondary | ICD-10-CM

## 2015-11-09 NOTE — BH Specialist Note (Signed)
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  He reported that he has been talking to the man from [place] he met on [email or web address] and he seems like a Financial risk analyst. The man has been giving him nice compliments and talking about them getting together sometime.  Rob wants to drive down there but says he can't afford it.  I told him to tell the man he would like to but can't afford to and see what he says.  He still puts himself down and I told him to work on accepting this man's compliments and I encouraged him to take a chance and go visit him.  Plan to meet again in one week. [name], LCSW

## 2015-11-16 ENCOUNTER — Ambulatory Visit: Payer: BLUE CROSS/BLUE SHIELD

## 2015-11-16 DIAGNOSIS — F331 Major depressive disorder, recurrent, moderate: Secondary | ICD-10-CM

## 2015-11-16 NOTE — BH Specialist Note (Signed)
Albert Brown was in a pleasant mood, reporting that he continues to talk to his new friend, [name], from [place], [place].  He said they talk every day on the phone and he said he is starting to have feelings for this man. I expressed my happiness for him and reminded him that just a few weeks ago he was very lonely and saying he wished he could meet someone. He talked about his concerns that [name] lives so far away.  He wants to go visit him but is "chicken", he said. Otherwise, he had no major concerns today.  Plan to meet again in one week. [name], LCSW

## 2015-11-23 ENCOUNTER — Ambulatory Visit: Payer: BLUE CROSS/BLUE SHIELD

## 2015-11-23 DIAGNOSIS — F331 Major depressive disorder, recurrent, moderate: Secondary | ICD-10-CM

## 2015-11-23 NOTE — BH Specialist Note (Signed)
Albert Brown reported that things were going well with him and his online friend [name] in [place] until this past Saturday and he noticed a change in his tone.  Then, Mike's phone had some problems and he couldn't hear Albert Brown and hung up.  Albert Brown is now feeling like maybe this isn't going to work out, so I encouraged him to give it some time and not jump to conclusions. He also has had some issues with his job and is talking with his Chartered certified accountant about it. Plan to meet again in one week. [name], LCSW

## 2015-11-30 ENCOUNTER — Ambulatory Visit: Payer: BLUE CROSS/BLUE SHIELD

## 2015-11-30 ENCOUNTER — Other Ambulatory Visit: Payer: BLUE CROSS/BLUE SHIELD

## 2015-11-30 DIAGNOSIS — Z79899 Other long term (current) drug therapy: Secondary | ICD-10-CM

## 2015-11-30 DIAGNOSIS — R7989 Other specified abnormal findings of blood chemistry: Secondary | ICD-10-CM

## 2015-11-30 DIAGNOSIS — B20 Human immunodeficiency virus [HIV] disease: Secondary | ICD-10-CM

## 2015-11-30 DIAGNOSIS — F331 Major depressive disorder, recurrent, moderate: Secondary | ICD-10-CM

## 2015-11-30 NOTE — BH Specialist Note (Signed)
Albert Brown reported that his old lover/friend, Albert Brown, came by recently, still saying he is in love with Albert Brown.  But Albert Brown said he told him to "get over it", because he is not in love with him anymore. He said he also continues to talk to his new friend, Albert Brown, from [place], and they are making plans for Albert Brown to go there to visit him during Thanksgiving.  He talked about how he enjoys going out by himself some now, where before he used to just stay at home and do nothing. I praised him for this.  Plan to meet again in 2 weeks. [name], LCSW

## 2015-12-01 ENCOUNTER — Other Ambulatory Visit: Payer: Self-pay | Admitting: Infectious Diseases

## 2015-12-01 DIAGNOSIS — Z79899 Other long term (current) drug therapy: Secondary | ICD-10-CM

## 2015-12-01 DIAGNOSIS — R7989 Other specified abnormal findings of blood chemistry: Secondary | ICD-10-CM

## 2015-12-01 LAB — HIV-1 RNA QUANT-NO REFLEX-BLD
HIV 1 RNA Quant: <20 copies/mL (ref <20)
HIV-1 RNA Quant, Log: <1.3 Log copies/mL (ref <1.30)

## 2015-12-01 LAB — LIPID PANEL
CHOLESTEROL: 179 mg/dL (ref 125–200)
HDL: 36 mg/dL — ABNORMAL LOW (ref >=40)
LDL Cholesterol: 80 mg/dL (ref <130)
TRIGLYCERIDES: 313 mg/dL — AB (ref <150)
Total CHOL/HDL Ratio: 5 Ratio (ref <=5.0)
VLDL: 63 mg/dL — ABNORMAL HIGH (ref <30)

## 2015-12-01 LAB — PSA: PSA: 2.8 ng/mL (ref <=4.0)

## 2015-12-01 LAB — T-HELPER CELL (CD4) - (RCID CLINIC ONLY)
CD4 T CELL ABS: 530 /uL (ref 400–2700)
CD4 T CELL HELPER: 33 % (ref 33–55)

## 2015-12-01 NOTE — Addendum Note (Signed)
Addended by: Dolan Amen D on: 12/01/2015 10:47 AM   Modules accepted: Orders

## 2015-12-02 LAB — TESTOSTERONE: TESTOSTERONE: 174 ng/dL — AB (ref 250–827)

## 2015-12-07 ENCOUNTER — Ambulatory Visit: Payer: BLUE CROSS/BLUE SHIELD

## 2015-12-14 ENCOUNTER — Ambulatory Visit: Payer: BLUE CROSS/BLUE SHIELD

## 2015-12-19 ENCOUNTER — Encounter: Payer: Self-pay | Admitting: Infectious Diseases

## 2015-12-19 ENCOUNTER — Ambulatory Visit (INDEPENDENT_AMBULATORY_CARE_PROVIDER_SITE_OTHER): Payer: BLUE CROSS/BLUE SHIELD | Admitting: Infectious Diseases

## 2015-12-19 ENCOUNTER — Telehealth: Payer: Self-pay | Admitting: *Deleted

## 2015-12-19 VITALS — BP 150/88 | HR 73 | Temp 98.0°F | Ht 68.0 in | Wt 193.0 lb

## 2015-12-19 DIAGNOSIS — I252 Old myocardial infarction: Secondary | ICD-10-CM

## 2015-12-19 DIAGNOSIS — Z955 Presence of coronary angioplasty implant and graft: Secondary | ICD-10-CM | POA: Diagnosis not present

## 2015-12-19 DIAGNOSIS — Z23 Encounter for immunization: Secondary | ICD-10-CM | POA: Diagnosis not present

## 2015-12-19 DIAGNOSIS — B20 Human immunodeficiency virus [HIV] disease: Secondary | ICD-10-CM

## 2015-12-19 DIAGNOSIS — F32A Depression, unspecified: Secondary | ICD-10-CM

## 2015-12-19 DIAGNOSIS — E785 Hyperlipidemia, unspecified: Secondary | ICD-10-CM

## 2015-12-19 DIAGNOSIS — F329 Major depressive disorder, single episode, unspecified: Secondary | ICD-10-CM

## 2015-12-19 DIAGNOSIS — Z113 Encounter for screening for infections with a predominantly sexual mode of transmission: Secondary | ICD-10-CM

## 2015-12-19 NOTE — Assessment & Plan Note (Addendum)
continues to f/u with Albert Brown. Greatly appreciate this.  Still has some problems with sleep- early awakening. Had minimal help from restoril

## 2015-12-19 NOTE — Telephone Encounter (Signed)
Patient would like to switch to cardiologist in [place] for convenience.  Patient due for cardiology follow up. He has not been taking his plavix, but recalls an orange pill that may be prescribed by cardiology.  He also will meet with Pharmacy to review his supplements. Landis Gandy, RN

## 2015-12-19 NOTE — Assessment & Plan Note (Signed)
He is doing very well Refuses Flu shot today Offered/refused condoms.  rtc in 6 months.

## 2015-12-19 NOTE — Assessment & Plan Note (Signed)
His Chol was normal. His trig were quite high.  He is going to diet and exercise.

## 2015-12-19 NOTE — Assessment & Plan Note (Addendum)
He needs to f/u with CV Will help to get him in with new CV He is aware he needs to lose wt  Needs better blood pressure control

## 2015-12-19 NOTE — Progress Notes (Signed)
   Subjective:    Patient ID: Albert Brown, male    DOB: November 27, 1955, 60 y.o.   MRN: [id number]  HPI 60 yo M with HIV+ (on triumeq --> complera--> odefsy)  He presented to [facility] on 08/07/2013 with complaint of exertional chest discomfort. He was given sublingual nitroglycerin glycerin and had a hypotensive blood pressure response.. Of note, he had taken Cialis within 24 hours.On cath he was found to have acute total occlusion of the mid RCA. This was successfully treated with PCI plus stenting utilizing a drug-eluting stent. He had well-preserved left ventricular systolic function (EF 123456). There was mild basilar to mid inferior hypokinesis.  At his last CV f/u, his ASA was stopped, he was changed from effluient to plavix.  His art was changed to complera (from triumeq, on stribild prior to that with increased Cr).  He has been having LE edema. Is on his feet a lot. Has noted wt gain as well.  He has been getting regular f/u with [facility] Ormond.   HIV 1 RNA Quant (copies/mL)  Date Value  11/30/2015 <20  07/04/2015 <20  05/25/2015 <20   CD4 T Cell Abs (/uL)  Date Value  11/30/2015 530  07/04/2015 480  05/25/2015 770    Review of Systems  Constitutional: Negative for appetite change and unexpected weight change.  Eyes: Positive for visual disturbance.  Respiratory: Negative for shortness of breath.   Cardiovascular: Positive for chest pain and leg swelling.  Neurological: Negative for headaches.  has rare CP- not clear how often. Lasts ~1 minute. No SOB. Occas radiation into neck/back/arm. Goes away without intervention, never takes NTG (made him "really ill" when he takes this). Has started taking CoQ10 and testosterone booster.      Objective:   Physical Exam  Constitutional: He appears well-developed and well-nourished.  HENT:  Mouth/Throat: No oropharyngeal exudate.  Eyes: EOM are normal. Pupils are equal, round, and reactive to light.  Neck: Neck supple.    Cardiovascular: Normal rate, regular rhythm and normal heart sounds.   Pulmonary/Chest: Effort normal and breath sounds normal.  Abdominal: Soft. Bowel sounds are normal. There is no tenderness. There is no rebound.  Musculoskeletal: He exhibits edema.  Lymphadenopathy:    He has no cervical adenopathy.      Assessment & Plan:

## 2015-12-21 ENCOUNTER — Ambulatory Visit: Payer: BLUE CROSS/BLUE SHIELD

## 2015-12-21 DIAGNOSIS — F331 Major depressive disorder, recurrent, moderate: Secondary | ICD-10-CM

## 2015-12-21 NOTE — BH Specialist Note (Signed)
Albert Brown reported that he has not had any contact for a few weeks with his friend [name] from [place].  He said he got tired of [name] talking about other men and how "hot" they were.  He said he told him that that was a turnoff, but he kept it up. He also is concerned that [name] doesn't get out and do anything and fears that he would just want Albert Brown to sit around his house. He is a bit down about this and continues to talk negative about his possibilities, which I point out and try to help him turn around. Plan to meet again in one week. [name], LCSW

## 2015-12-25 ENCOUNTER — Ambulatory Visit (INDEPENDENT_AMBULATORY_CARE_PROVIDER_SITE_OTHER): Payer: BLUE CROSS/BLUE SHIELD | Admitting: Pharmacist Clinician (PhC)/ Clinical Pharmacy Specialist

## 2015-12-25 DIAGNOSIS — B20 Human immunodeficiency virus [HIV] disease: Secondary | ICD-10-CM

## 2015-12-25 NOTE — Patient Instructions (Signed)
Establish care with PCP Cont Odefsey Stop testosterone booster

## 2015-12-25 NOTE — Progress Notes (Signed)
Patient ID: Albert Brown, male   DOB: 1955-06-22, 60 y.o.   MRN: [id number] HPI: Albert Brown is a 60 y.o. male who is here to discuss with pharmacy about some of his supplements that he has been taking.   Allergies: Allergies  Allergen Reactions  . Brilinta [Ticagrelor] Shortness Of Breath    BREATHING ISSUES  . Codeine Anaphylaxis  . Bactrim [Sulfamethoxazole-Trimethoprim]     "Pt stated he died when taking this"  . Ciprofloxacin     REACTION: rash  . Dapsone     REACTION: severe-anaphylaxis  . Pravastatin Other (See Comments)    Muscle cramps  . Sulfamethoxazole-Trimethoprim     Anaphylaxis     Vitals:    Past Medical History: Past Medical History:  Diagnosis Date  . ALLERGIC RHINITIS   . Allergic sinusitis   . Anxiety state, unspecified   . CAD S/P percutaneous coronary angioplasty 08/08/2013   mRCA  Xience Alpine DES 2.75 mm x 33 mm (3.0 mm)  . Cerumen impaction, right   . DEPRESSION   . Diarrhea   . Erectile dysfunction   . EXTERNAL HEMORRHOIDS WITHOUT MENTION COMP   . GERD   . HIV DISEASE   . HIV infection ([place] [facility])   . HYPOGONADISM   . Non-ST elevated myocardial infarction (non-STEMI) ([place]) 08/08/2013   SubAcute100% RCA -- PCI; Echo: EF 55-605, Normal LV Size & Function - ~mild HK of Inferior Myocardium with mildly reduced RV function.  Normal Valve function  . Periodontal disease   . PERIPHERAL NEUROPATHY   . PHARYNGITIS, RECURRENT   . Pterygium of left eye   . Urinary frequency   . VERTIGO     Social History: Social History   Social History  . Marital status: Single    Spouse name: N/A  . Number of children: N/A  . Years of education: N/A   Social History Main Topics  . Smoking status: Former Smoker    Types: Cigarettes    Quit date: 03/02/2007  . Smokeless tobacco: Never Used     Comment: pt. no longer smokes  . Alcohol use Yes     Comment: weekends  . Drug use: No  . Sexual activity: No     Comment: pt. refused condoms   Other Topics  Concern  . Not on file   Social History Narrative  . No narrative on file    Previous Regimen: Triumeq, complera  Current Regimen: Odefsey  Labs: HIV 1 RNA Quant (copies/mL)  Date Value  11/30/2015 <20  07/04/2015 <20  05/25/2015 <20   CD4 T Cell Abs (/uL)  Date Value  11/30/2015 530  07/04/2015 480  05/25/2015 770   Hep B S Ab (no units)  Date Value  12/29/2012 POS (A)   Hepatitis B Surface Ag (no units)  Date Value  07/30/2007 NEG    CrCl: CrCl cannot be calculated (Patient's most recent lab result is older than the maximum 21 days allowed.).  Lipids:    Component Value Date/Time   CHOL 179 12/01/2015 1045   TRIG 313 (H) 12/01/2015 1045   HDL 36 (L) 12/01/2015 1045   CHOLHDL 5.0 12/01/2015 1045   VLDL 63 (H) 12/01/2015 1045   LDLCALC 80 12/01/2015 1045    Assessment: Albert Brown is doing well with his ART, however, he has some questions about some of his supplements. He is currently on CoQ10 and male testosterone booster. His most recent testosterone level was low. He stated that he has felt more  lack of energy. I told him to establish care with a primary care doctor. Previously, he had ADAP only but he is insured now.   He is going to call to set up an appt with [facility] urgent/primary care after the visit today. Told him that they are more equipped to manage his low testosterone. Since he also had a recent hx of CAD, he'd also need to be on a statin. His LDL was still slightly above goal and HLD is low. His TG is pretty elevated. Either PCP or cardiovascular would need to put him on a statin. He appreciated our advice and will set up an appt soon to established care.   Recommendations:  Cont Odefsey Stop CoQ10 until he is on a statins Stop testosterone booster Establish care with primary care  Wilfred Lacy, PharmD, BCPS, AAHIVP, [place] [facility] Infectious [facility] 12/25/2015, 3:51 PM

## 2015-12-28 ENCOUNTER — Ambulatory Visit: Payer: BLUE CROSS/BLUE SHIELD

## 2015-12-28 DIAGNOSIS — F331 Major depressive disorder, recurrent, moderate: Secondary | ICD-10-CM

## 2015-12-28 NOTE — BH Specialist Note (Signed)
Rob reported that he has given up on his friend in [place] he met on [email or web address] and he has not had anyone contacting him lately. I suggested he update his profile, upload more pics, and add more information to his description.  He agreed that maybe this will generate more activity. He continues to want to be in a loving relationship and is frustrated with being alone.  We talked about relationships and his past history.  Plan to meet again in one week. [name], LCSW

## 2016-01-02 ENCOUNTER — Encounter: Payer: Self-pay | Admitting: Cardiology

## 2016-01-02 ENCOUNTER — Ambulatory Visit (INDEPENDENT_AMBULATORY_CARE_PROVIDER_SITE_OTHER): Payer: BLUE CROSS/BLUE SHIELD | Admitting: Cardiology

## 2016-01-02 VITALS — BP 150/90 | HR 82 | Ht 68.0 in | Wt 193.8 lb

## 2016-01-02 DIAGNOSIS — E785 Hyperlipidemia, unspecified: Secondary | ICD-10-CM | POA: Diagnosis not present

## 2016-01-02 DIAGNOSIS — Z9861 Coronary angioplasty status: Secondary | ICD-10-CM | POA: Diagnosis not present

## 2016-01-02 DIAGNOSIS — I2510 Atherosclerotic heart disease of native coronary artery without angina pectoris: Secondary | ICD-10-CM | POA: Diagnosis not present

## 2016-01-02 MED ORDER — SIMVASTATIN 20 MG PO TABS: 20.0000 mg | ORAL_TABLET | Freq: Every day | ORAL | 6 refills | Status: DC

## 2016-01-02 MED ORDER — ASPIRIN EC 81 MG PO TBEC: 81.0000 mg | DELAYED_RELEASE_TABLET | Freq: Every day | ORAL | 3 refills | Status: AC

## 2016-01-02 NOTE — Progress Notes (Signed)
Cardiology Office Note   Date:  01/02/2016   ID:  Albert Brown, DOB [birth year], MRN [id number]  Referring Doctor:  [name], MD   Cardiologist:   Wende Bushy, MD   Reason for consultation:  Chief Complaint  Patient presents with  . Follow-up  . Edema    in ankles and feet  . Shortness of Breath    with exertion  . Chest Pain      History of Present Illness: Albert Brown is a 60 y.o. male who presents for Follow-up for history CAD  Patient was last seen in our office a year ago. Since then, he has been doing fairly well. He has noted recent onset of chest pain and shortness of breath. This has been going on for the last several months or so. He describes the chest pain is some discomfort in the chest, nonradiating, moderate in intensity, lasting several minutes at a time, no precipitating or palliating factors, resolving spontaneously. Not similar to his heartburn episodes. Not similar to how he presented with a heart attack in 2015. At that time, he mainly presented with significant arm discomfort and not feeling well.  He does have some shortness of breath with exertion going on the same time as the chest pain.  He admits to eating poorly, due to lack of time to prepare fresh meals. He knows that his diet is high in salt. He has gained some weight as well since his last visit.  He has a long history of poor sleep with most number of hours that at night roughly 5 hours per night. This has been going on for 20+ years. He has not established with a PCP in many years or so now.  Patient denies PND, orthopnea. He has occasional edema and usually at the end of the day. No palpitations, orthopnea, abdominal discomfort.  ROS:  Please see the history of present illness. Aside from mentioned under HPI, all other systems are reviewed and negative.     Past Medical History:  Diagnosis Date  . ALLERGIC RHINITIS   . Allergic sinusitis   . Anxiety state, unspecified     . CAD S/P percutaneous coronary angioplasty 08/08/2013   mRCA  Xience Alpine DES 2.75 mm x 33 mm (3.0 mm)  . Cerumen impaction, right   . DEPRESSION   . Diarrhea   . Erectile dysfunction   . EXTERNAL HEMORRHOIDS WITHOUT MENTION COMP   . GERD   . HIV DISEASE   . HIV infection ([place])   . HYPOGONADISM   . Non-ST elevated myocardial infarction (non-STEMI) ([place]) 08/08/2013   SubAcute100% RCA -- PCI; Echo: EF 55-605, Normal LV Size & Function - ~mild HK of Inferior Myocardium with mildly reduced RV function.  Normal Valve function  . Periodontal disease   . PERIPHERAL NEUROPATHY   . PHARYNGITIS, RECURRENT   . Pterygium of left eye   . Urinary frequency   . VERTIGO     Past Surgical History:  Procedure Laterality Date  . LEFT HEART CATHETERIZATION WITH CORONARY ANGIOGRAM N/A 08/08/2013   Procedure: LEFT HEART CATHETERIZATION WITH CORONARY ANGIOGRAM;  Surgeon: [name], MD;  Location: [facility] CATH LAB;  Service: Cardiovascular;  Laterality: N/A;  . PERCUTANEOUS CORONARY STENT INTERVENTION (PCI-S)  08/08/2013   mRCA 100% --> Xience Alpine DES 2.75 mm x 33 mm --> 3.0 mm     reports that he quit smoking about 8 years ago. His smoking use included Cigarettes. He has never  used smokeless tobacco. He reports that he drinks alcohol. He reports that he does not use drugs.   family history includes Cancer in his brother and mother; Osteosarcoma in his father; Prostate cancer in his brother, brother, and father; Rheum arthritis in his mother.   Outpatient Medications Prior to Visit  Medication Sig Dispense Refill  . clopidogrel (PLAVIX) 75 MG tablet Take 1 tablet (75 mg total) by mouth daily. 30 tablet 11  . emtricitabine-rilpivir-tenofovir AF (ODEFSEY) 200-25-25 MG TABS tablet Take 1 tablet by mouth daily with breakfast. 90 tablet 3  . Multiple Vitamin (MULTI VITAMIN MENS PO) Take 1 tablet by mouth daily.    . Coenzyme Q10 (CO Q 10 PO) Take 10 mLs by mouth.    . Misc Natural Products (NF  FORMULAS TESTOSTERONE) CAPS Take 2 capsules by mouth daily. Weider Prime healthy testosterone support    . traZODone (DESYREL) 50 MG tablet Take 50 mg by mouth daily as needed for sleep.    . nitroGLYCERIN (NITROSTAT) 0.4 MG SL tablet Place 1 tablet (0.4 mg total) under the tongue every 5 (five) minutes as needed for chest pain. (Patient not taking: Reported on 01/02/2016) 25 tablet 3  . ANDROGEL PUMP 20.25 MG/ACT (1.62%) GEL APPLY 2 PUMPS TO SKIN DAILY (Patient not taking: Reported on 01/02/2016) 75 g 5   No facility-administered medications prior to visit.      Allergies: Brilinta [ticagrelor]; Codeine; Bactrim [sulfamethoxazole-trimethoprim]; Ciprofloxacin; Dapsone; Pravastatin; and Sulfamethoxazole-trimethoprim    PHYSICAL EXAM: VS:  BP (!) 150/90 (BP Location: Right Arm, Cuff Size: Normal)   Pulse 82   Ht 5\' 8"  (1.727 m)   Wt 193 lb 12.8 oz (87.9 kg)   BMI 29.47 kg/m  , Body mass index is 29.47 kg/m. Wt Readings from Last 3 Encounters:  01/02/16 193 lb 12.8 oz (87.9 kg)  12/19/15 193 lb (87.5 kg)  01/16/15 188 lb 4.8 oz (85.4 kg)    GENERAL:  well developed, well nourished, not in acute distress HEENT: normocephalic, pink conjunctivae, anicteric sclerae, no xanthelasma, normal dentition, oropharynx clear NECK:  no neck vein engorgement, JVP normal, no hepatojugular reflux, carotid upstroke brisk and symmetric, no bruit, no thyromegaly, no lymphadenopathy LUNGS:  good respiratory effort, clear to auscultation bilaterally CV:  PMI not displaced, no thrills, no lifts, S1 and S2 within normal limits, no palpable S3 or S4, no murmurs, no rubs, no gallops ABD:  Soft, nontender, nondistended, normoactive bowel sounds, no abdominal aortic bruit, no hepatomegaly, no splenomegaly MS: nontender back, no kyphosis, no scoliosis, no joint deformities EXT:  2+ DP/PT pulses, no edema, no varicosities, no cyanosis, no clubbing SKIN: warm, nondiaphoretic, normal turgor, no ulcers NEUROPSYCH: alert,  oriented to person, place, and time, sensory/motor grossly intact, normal mood, appropriate affect  Recent Labs: 07/04/2015: ALT 20; BUN 19; Creat 1.10; Hemoglobin 15.4; Platelets 163; Potassium 4.5; Sodium 142   Lipid Panel    Component Value Date/Time   CHOL 179 12/01/2015 1045   TRIG 313 (H) 12/01/2015 1045   HDL 36 (L) 12/01/2015 1045   CHOLHDL 5.0 12/01/2015 1045   VLDL 63 (H) 12/01/2015 1045   LDLCALC 80 12/01/2015 1045     Other studies Reviewed:  EKG:  The ekg from 01/02/2016 was personally reviewed by me and it revealed sinus rhythm, 82 BPM.  Additional studies/ records that were reviewed personally reviewed by me today include:  Echocardiogram 08/08/2013: Left ventricle: The cavity size was normal. Systolic function was normal. The estimated ejection fraction was in the  range of 55% to 60%. Possible mild hypokinesis of the inferior myocardium. - Right ventricle: Systolic function was mildly to moderately reduced.  Hemodynamics:   Central Aortic Pressure / Mean: 96/65/78 mmHg  Left Ventricular Pressure / LVEDP: 104/2/11 mmHg  Left Ventriculography:  EF: ~60-65%  Wall Motion: mild basal to mid inferior hypokinesis.  Coronary Anatomy:  Dominance: Right   Left Main: Large-caliber vessel that bifurcates into the LAD and nondominant Circumflex. Angiographically normal. There is a small, insignificant Ramus Intermedius.  LAD: Large-caliber vessel that gives off a major proximal first diagonal branch (D1) that courses almost is a Ramus Intermedius, followed by a smaller D2 and D3. The LAD reaches down to wrap the apex. Minimal (10-20%) luminal irregularities are noted at the branch point of significant diagonal vessels.  D1: Large-caliber vessel, angiographically normal  D2: Small-caliber vessel, angiographically normal.   D3: Moderate caliber vessel, angiographically normal  Left Circumflex: Large caliber vessel that essentially courses as a lateral  OM branch with a very small AV groove branch and atrial branch. Minimal luminal irregularities.   RCA: Large-caliber, dominant vessel that has a early mid 70% stenosis followed by a tapering 100% thrombotic occlusion. Post PCI angiography reveals a long segment of 50-60% stenosis followed by a 70% stenosis prior to the crux. The vessel then normalizes into a relatively normal distal RCA bifurcating into the Right Posterior AV Groove Branch (RPAV) and small caliber RPDA. There is 1 RPL that is major.  ASSESSMENT AND PLAN: Chest pain Shortness of breath CAD status post PTCA, drug-eluting stent to mid RCA History of NSTEMI Resume therapy. He finished with Plavix. Start aspirin 81 mg by mouth daily Recommend statin therapy with simvastatin 20 mg by mouth daily at bedtime. Repeat fasting lipid panel and CMP in 3 months time. Patient hesitant to start any other medications for now. In terms of his symptoms, we'll need to set him up for an exercise nuclear stress test.  Elevated blood pressure Advised to do a blood pressure log. Color office if not less than 140/80. Patient is aware about importance of sodium restriction.  Hyperlipidemia LDL goal is less than 70. Start statin therapy nd lifestyle changes.    Current medicines are reviewed at length with the patient today.  The patient does not have concerns regarding medicines.  Labs/ tests ordered today include:  Orders Placed This Encounter  Procedures  . EKG 12-Lead    I had a lengthy and detailed discussion with the patient regarding diagnoses, prognosis, diagnostic options, treatment options , and side effects of medications.   I counseled the patient on importance of lifestyle modification including heart healthy diet, regular physical activity Once cardiac workup is completed  Disposition:   FU with undersigned after tests or 1-2 months  I spent at least 40 minutes with the patient today and more than 50% of the time was spent  counseling the patient and coordinating care.     Signed, Wende Bushy, MD  01/02/2016 4:40 PM    [place]  This note was generated in part with voice recognition software and I apologize for any typographical errors that were not detected and corrected.

## 2016-01-02 NOTE — Patient Instructions (Addendum)
Medication Instructions:  Your physician has recommended you make the following change in your medication:  1. START Simvastatin 20 mg once daily at bedtime. 2. START Aspirin 81 mg once daily  Your physician has requested that you regularly monitor and record your blood pressure readings at home. Please use the same machine at the same time of day to check your readings and record them to bring to your follow-up visit.  Please call Albert Brown if your blood pressure remains greater than 140/80.   Testing/Procedures: [place]  Your caregiver has ordered a Stress Test with nuclear imaging. The purpose of this test is to evaluate the blood supply to your heart muscle. This procedure is referred to as a "Non-Invasive Stress Test." This is because other than having an IV started in your vein, nothing is inserted or "invades" your body. Cardiac stress tests are done to find areas of poor blood flow to the heart by determining the extent of coronary artery disease (CAD). Some patients exercise on a treadmill, which naturally increases the blood flow to your heart, while others who are  unable to walk on a treadmill due to physical limitations have a pharmacologic/chemical stress agent called Lexiscan . This medicine will mimic walking on a treadmill by temporarily increasing your coronary blood flow.   Please note: these test may take anywhere between 2-4 hours to complete  PLEASE REPORT TO [place] AT THE FIRST DESK WILL DIRECT YOU WHERE TO GO  Date of Procedure:_____________________________________  Arrival Time for Procedure:______________________________   PLEASE NOTIFY THE OFFICE AT LEAST 24 HOURS IN ADVANCE IF YOU ARE UNABLE TO KEEP YOUR APPOINTMENT.  [phone] AND  PLEASE NOTIFY NUCLEAR MEDICINE AT [facility] AT LEAST 24 HOURS IN ADVANCE IF YOU ARE UNABLE TO KEEP YOUR APPOINTMENT. [phone]  How to prepare for your Myoview test:  1. Do not eat or drink  after midnight 2. No caffeine for 24 hours prior to test 3. No smoking 24 hours prior to test. 4. Your medication may be taken with water.  If your doctor stopped a medication because of this test, do not take that medication. 5. Ladies, please do not wear dresses.  Skirts or pants are appropriate. Please wear a short sleeve shirt. 6. No perfume, cologne or lotion. 7. Wear comfortable walking shoes. No heels!   Follow-Up: Your physician recommends that you schedule a follow-up appointment in: 2 months with Dr. [name].   It was a pleasure seeing you today here in the office. Please do not hesitate to give Albert Brown a call back if you have any further questions. [place], BSN       Cardiac Nuclear Scanning A cardiac nuclear scan is used to check your heart for problems, such as the following:  A portion of the heart is not getting enough blood.  Part of the heart muscle has died, which happens with a heart attack.  The heart wall is not working normally.  In this test, a radioactive dye (tracer) is injected into your bloodstream. After the tracer has traveled to your heart, a scanning device is used to measure how much of the tracer is absorbed by or distributed to various areas of your heart. LET [facility] CARE PROVIDER KNOW ABOUT:  Any allergies you have.  All medicines you are taking, including vitamins, herbs, eye drops, creams, and over-the-counter medicines.  Previous problems you or members of your family have had with the use of anesthetics.  Any blood disorders you have.  Previous surgeries you have had.  Medical conditions you have.  RISKS AND COMPLICATIONS Generally, this is a safe procedure. However, as with any procedure, problems can occur. Possible problems include:   Serious chest pain.  Rapid heartbeat.  Sensation of warmth in your chest. This usually passes quickly. BEFORE THE PROCEDURE Ask your health care provider about changing or  stopping your regular medicines. PROCEDURE This procedure is usually done at a hospital and takes 2-4 hours.  An IV tube is inserted into one of your veins.  Your health care provider will inject a small amount of radioactive tracer through the tube.  You will then wait for 20-40 minutes while the tracer travels through your bloodstream.  You will lie down on an exam table so images of your heart can be taken. Images will be taken for about 15-20 minutes.  You will exercise on a treadmill or stationary bike. While you exercise, your heart activity will be monitored with an electrocardiogram (ECG), and your blood pressure will be checked.  If you are unable to exercise, you may be given a medicine to make your heart beat faster.  When blood flow to your heart has peaked, tracer will again be injected through the IV tube.  After 20-40 minutes, you will get back on the exam table and have more images taken of your heart.  When the procedure is over, your IV tube will be removed. AFTER THE PROCEDURE  You will likely be able to leave shortly after the test. Unless your health care provider tells you otherwise, you may return to your normal schedule, including diet, activities, and medicines.  Make sure you find out how and when you will get your test results.   This information is not intended to replace advice given to you by your health care provider. Make sure you discuss any questions you have with your health care provider.   Document Released: 03/08/2004 Document Revised: 02/16/2013 Document Reviewed: 01/20/2013 Elsevier Interactive Patient Education 2016 Elsevier Inc. Blood Pressure Record Sheet Your blood pressure on this visit to the emergency department or clinic is elevated. This does not necessarily mean you have high blood pressure (hypertension), but it does mean that your blood pressure needs to be rechecked. Many times your blood pressure can increase due to illness, pain,  anxiety, or other factors. We recommend that you get a series of blood pressure readings done over a period of 5 days. It is best to get a reading in the morning and one in the evening. You should make sure to sit and relax for 1-5 minutes before the reading is taken. Write the readings down and make a follow-up appointment with your health care provider to discuss the results. If there is not a free clinic or a drug store with a blood-pressure-taking machine near you, you can purchase blood-pressure-taking equipment from a drug store. Having one in the home allows you the convenience of taking your blood pressure while you are home and relaxed.  Your blood pressure in the emergency department or clinic on ________ was ____________________. BLOOD PRESSURE LOG Date: _______________________  a.m. _____________________  p.m. _____________________ Date: _______________________  a.m. _____________________  p.m. _____________________ Date: _______________________  a.m. _____________________  p.m. _____________________ Date: _______________________  a.m. _____________________  p.m. _____________________ Date: _______________________  a.m. _____________________  p.m. _____________________   This information is not intended to replace advice given to you by your health care provider. Make sure you discuss any  questions you have with your health care provider.   Document Released: 11/10/2002 Document Revised: 03/04/2014 Document Reviewed: 04/06/2013 Elsevier Interactive Patient Education 2016 [employer].  How to Take Your Blood Pressure HOW DO I GET A BLOOD PRESSURE MACHINE?  You can buy an electronic home blood pressure machine at your local pharmacy. Insurance will sometimes cover the cost if you have a prescription.  Ask your doctor what type of machine is best for you. There are different machines for your arm and your wrist.  If you decide to buy a machine to check your  blood pressure on your arm, first check the size of your arm so you can buy the right size cuff. To check the size of your arm:   Use a measuring tape that shows both inches and centimeters.   Wrap the measuring tape around the upper-middle part of your arm. You may need someone to help you measure.   Write down your arm measurement in both inches and centimeters.   To measure your blood pressure correctly, it is important to have the right size cuff.   If your arm is up to 13 inches (up to 34 centimeters), get an adult cuff size.  If your arm is 13 to 17 inches (35 to 44 centimeters), get a large adult cuff size.    If your arm is 17 to 20 inches (45 to 52 centimeters), get an adult thigh cuff.  WHAT DO THE NUMBERS MEAN?   There are two numbers that make up your blood pressure. For example: 120/80.  The first number (120 in our example) is called the "systolic pressure." It is a measure of the pressure in your blood vessels when your heart is pumping blood.  The second number (80 in our example) is called the "diastolic pressure." It is a measure of the pressure in your blood vessels when your heart is resting between beats.  Your doctor will tell you what your blood pressure should be. WHAT SHOULD I DO BEFORE I CHECK MY BLOOD PRESSURE?   Try to rest or relax for at least 30 minutes before you check your blood pressure.  Do not smoke.  Do not have any drinks with caffeine, such as:  Soda.  Coffee.  Tea.  Check your blood pressure in a quiet room.  Sit down and stretch out your arm on a table. Keep your arm at about the level of your heart. Let your arm relax.  Make sure that your legs are not crossed. HOW DO I CHECK MY BLOOD PRESSURE?  Follow the directions that came with your machine.  Make sure you remove any tight-fitting clothing from your arm or wrist. Wrap the cuff around your upper arm or wrist. You should be able to fit a finger between the cuff and your  arm. If you cannot fit a finger between the cuff and your arm, it is too tight and should be removed and rewrapped.  Some units require you to manually pump up the arm cuff.  Automatic units inflate the cuff when you press a button.  Cuff deflation is automatic in both models.  After the cuff is inflated, the unit measures your blood pressure and pulse. The readings are shown on a monitor. Hold still and breathe normally while the cuff is inflated.  Getting a reading takes less than a minute.  Some models store readings in a memory. Some provide a printout of readings. If your machine does not store your readings,  keep a written record.  Take readings with you to your next visit with your doctor.   This information is not intended to replace advice given to you by your health care provider. Make sure you discuss any questions you have with your health care provider.   Document Released: 01/25/2008 Document Revised: 03/04/2014 Document Reviewed: 04/08/2013 Elsevier Interactive Patient Education [employer].

## 2016-01-04 ENCOUNTER — Ambulatory Visit: Payer: BLUE CROSS/BLUE SHIELD

## 2016-01-04 DIAGNOSIS — F32A Depression, unspecified: Secondary | ICD-10-CM

## 2016-01-04 DIAGNOSIS — F329 Major depressive disorder, single episode, unspecified: Secondary | ICD-10-CM

## 2016-01-04 NOTE — BH Specialist Note (Signed)
Rob reported that he and his friend in [place] are talking to each other again. He admitted he has feelings for him and was glad to hear his voice. He is torn about reconnecting with him though, because there are some things about him that Rob doesn't like, such as his negative attitude and his tendency to want to stay home most of the time. Rob said he likes to get out and do things. He said they are talking about Rob going down to visit him after Thanksgiving. I helped him process his emotions about all of this and said at least he is out of his shell of not talking to guys. Plan to meet again in one week. [name], LCSW

## 2016-01-05 ENCOUNTER — Encounter: Payer: Self-pay | Admitting: *Deleted

## 2016-01-05 ENCOUNTER — Telehealth: Payer: Self-pay | Admitting: Cardiology

## 2016-01-05 NOTE — Telephone Encounter (Addendum)
Called patients other number and was able to schedule his stress test. Reviewed instructions with him and he requested that I mail him a letter with those instructions. Will send that out to him in the mail with his instructions, date, time, and location. He was appreciative for the call and had no further questions at this time.

## 2016-01-05 NOTE — Telephone Encounter (Signed)
No answer. No voicemail. 

## 2016-01-09 ENCOUNTER — Other Ambulatory Visit: Payer: Self-pay | Admitting: *Deleted

## 2016-01-09 DIAGNOSIS — B20 Human immunodeficiency virus [HIV] disease: Secondary | ICD-10-CM

## 2016-01-09 MED ORDER — EMTRICITAB-RILPIVIR-TENOFOV AF 200-25-25 MG PO TABS: 1.0000 | ORAL_TABLET | Freq: Every day | ORAL | 3 refills | Status: DC

## 2016-01-11 ENCOUNTER — Ambulatory Visit: Payer: BLUE CROSS/BLUE SHIELD

## 2016-01-11 DIAGNOSIS — F330 Major depressive disorder, recurrent, mild: Secondary | ICD-10-CM

## 2016-01-11 NOTE — BH Specialist Note (Signed)
Albert Brown was in a good mood today and talked about how much he enjoys cooking, but needs to lose weight, per his cardiologist. He talked about the upcoming holidays as well and his plans to visit with family. No major concerns today.  Plan to meet again in 2 weeks. Albert Spice, LCSW

## 2016-01-15 ENCOUNTER — Encounter
Admission: RE | Admit: 2016-01-15 | Discharge: 2016-01-15 | Disposition: A | Discharge status: Home / Self Care | Payer: BLUE CROSS/BLUE SHIELD | Source: Ambulatory Visit | Attending: Cardiology | Admitting: Cardiology

## 2016-01-15 DIAGNOSIS — Z9861 Coronary angioplasty status: Secondary | ICD-10-CM | POA: Insufficient documentation

## 2016-01-15 DIAGNOSIS — I2510 Atherosclerotic heart disease of native coronary artery without angina pectoris: Secondary | ICD-10-CM

## 2016-01-15 DIAGNOSIS — E785 Hyperlipidemia, unspecified: Secondary | ICD-10-CM | POA: Insufficient documentation

## 2016-01-15 LAB — NM MYOCAR MULTI W/SPECT W/WALL MOTION / EF
CHL CUP RESTING HR STRESS: 67 {beats}/min
CHL CUP STRESS STAGE 1 GRADE: 0 %
CHL CUP STRESS STAGE 2 GRADE: 0 %
CHL CUP STRESS STAGE 2 HR: 82 {beats}/min
CHL CUP STRESS STAGE 2 SPEED: 0 mph
CHL CUP STRESS STAGE 3 GRADE: 10 %
CHL CUP STRESS STAGE 3 HR: 139 {beats}/min
CHL CUP STRESS STAGE 4 DBP: 86 mmHg
CHL CUP STRESS STAGE 5 GRADE: 0 %
CHL CUP STRESS STAGE 5 SPEED: 0 mph
CHL CUP STRESS STAGE 6 GRADE: 0 %
CHL CUP STRESS STAGE 6 HR: 99 {beats}/min
CSEPEDS: 47 s
CSEPEW: 7 METS
CSEPPBP: 124 mmHg
CSEPPHR: 160 {beats}/min
CSEPPMHR: 98 %
Exercise duration (min): 5 min
LV dias vol: 59 mL (ref 62–150)
LV sys vol: 17 mL
NUC STRESS TID: 0.78
Percent HR: 98 %
SDS: 0
SRS: 3
SSS: 3
Stage 1 HR: 82 {beats}/min
Stage 1 Speed: 0 mph
Stage 3 Speed: 1.7 mph
Stage 4 Grade: 12 %
Stage 4 HR: 160 {beats}/min
Stage 4 SBP: 124 mmHg
Stage 4 Speed: 2.5 mph
Stage 5 HR: 129 {beats}/min
Stage 6 Speed: 0 mph

## 2016-01-15 MED ORDER — TECHNETIUM TC 99M TETROFOSMIN IV KIT
33.0000 | PACK | Freq: Once | INTRAVENOUS | Status: AC | PRN
  Administered 2016-01-15: 32.586 via INTRAVENOUS

## 2016-01-15 MED ORDER — TECHNETIUM TC 99M TETROFOSMIN IV KIT
13.0000 | PACK | Freq: Once | INTRAVENOUS | Status: AC | PRN
  Administered 2016-01-15: 12.767 via INTRAVENOUS

## 2016-01-25 ENCOUNTER — Ambulatory Visit: Payer: BLUE CROSS/BLUE SHIELD

## 2016-02-01 ENCOUNTER — Ambulatory Visit: Payer: BLUE CROSS/BLUE SHIELD

## 2016-02-01 DIAGNOSIS — F331 Major depressive disorder, recurrent, moderate: Secondary | ICD-10-CM

## 2016-02-01 NOTE — BH Specialist Note (Signed)
Albert Brown reported that he has been depressed lately and said he always does around this time of year. He said it is partly the holidays, but is also the shorter daylight. I helped him process his emotions re: this. He also said things have not worked out with the man he was talking to in [place], after the man didn't answer his phone and never called him back. I suggested he try one of the other online dating sites. Plan to meet again in one week. [name], LCSW

## 2016-02-08 ENCOUNTER — Encounter: Payer: Self-pay | Admitting: Internal Medicine

## 2016-02-08 ENCOUNTER — Ambulatory Visit: Payer: BLUE CROSS/BLUE SHIELD

## 2016-02-08 DIAGNOSIS — F330 Major depressive disorder, recurrent, mild: Secondary | ICD-10-CM
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
## 2016-02-15 ENCOUNTER — Ambulatory Visit: Payer: BLUE CROSS/BLUE SHIELD

## 2016-02-15 DIAGNOSIS — F330 Major depressive disorder, recurrent, mild: Secondary | ICD-10-CM

## 2016-02-15 NOTE — BH Specialist Note (Signed)
Rob was in a pleasant mood today reporting that he has gone back on [email or web address] and has found someone from [place] that he plans to contact because he thinks they could be friends. I praised him for this. 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 This made him feel good. Plan to meet again in one week. [name], LCSW

## 2016-02-22 ENCOUNTER — Ambulatory Visit: Payer: BLUE CROSS/BLUE SHIELD

## 2016-02-22 DIAGNOSIS — F330 Major depressive disorder, recurrent, mild: Secondary | ICD-10-CM
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
## 2016-02-29 ENCOUNTER — Ambulatory Visit: Payer: BLUE CROSS/BLUE SHIELD

## 2016-02-29 DIAGNOSIS — F330 Major depressive disorder, recurrent, mild: Secondary | ICD-10-CM

## 2016-02-29 NOTE — BH Specialist Note (Signed)
Albert Brown reported that he has been texting a new friend from [place], [place] that he met on the [email or web address] personals. I told him I was glad for him. He said he needed help uploading a picture to his profile page, so I helped him with this and he thanked me for it. He said he has been depressed, but is hopeful that he is getting some attention from that site. Overall, he seems to be doing okay. Plan to meet again in one week. [name], LCSW

## 2016-03-04 ENCOUNTER — Ambulatory Visit: Payer: BLUE CROSS/BLUE SHIELD

## 2016-03-05 ENCOUNTER — Ambulatory Visit: Payer: BLUE CROSS/BLUE SHIELD | Admitting: Internal Medicine

## 2016-03-07 ENCOUNTER — Ambulatory Visit: Payer: BLUE CROSS/BLUE SHIELD

## 2016-03-12 ENCOUNTER — Encounter: Payer: Self-pay | Admitting: Infectious Diseases

## 2016-03-14 ENCOUNTER — Ambulatory Visit: Payer: BLUE CROSS/BLUE SHIELD

## 2016-03-21 ENCOUNTER — Ambulatory Visit: Payer: BLUE CROSS/BLUE SHIELD

## 2016-03-21 DIAGNOSIS — F330 Major depressive disorder, recurrent, mild: Secondary | ICD-10-CM
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
## 2016-03-26 ENCOUNTER — Ambulatory Visit (INDEPENDENT_AMBULATORY_CARE_PROVIDER_SITE_OTHER): Payer: BLUE CROSS/BLUE SHIELD | Admitting: Cardiology

## 2016-03-26 ENCOUNTER — Encounter: Payer: Self-pay | Admitting: Cardiology

## 2016-03-26 ENCOUNTER — Ambulatory Visit: Payer: BLUE CROSS/BLUE SHIELD | Admitting: Internal Medicine

## 2016-03-26 VITALS — BP 153/89 | HR 76 | Ht 68.0 in | Wt 198.5 lb

## 2016-03-26 DIAGNOSIS — I2510 Atherosclerotic heart disease of native coronary artery without angina pectoris: Secondary | ICD-10-CM

## 2016-03-26 DIAGNOSIS — Z9861 Coronary angioplasty status: Secondary | ICD-10-CM | POA: Diagnosis not present

## 2016-03-26 DIAGNOSIS — R030 Elevated blood-pressure reading, without diagnosis of hypertension: Secondary | ICD-10-CM

## 2016-03-26 DIAGNOSIS — E785 Hyperlipidemia, unspecified: Secondary | ICD-10-CM

## 2016-03-26 NOTE — Patient Instructions (Signed)
Labwork: Your physician recommends that you return for lab work. Make sure to not eat or drink after midnight before having these done.   Your physician has requested that you regularly monitor and record your blood pressure readings at home. Please use the same machine at the same time of day to check your readings and record them to bring to your follow-up visit.  Please call if your blood pressure remains over 140/80.  Follow-Up: Your physician wants you to follow-up in: 6 months with Dr. [name]. You will receive a reminder letter in the mail two months in advance. If you don't receive a letter, please call our office to schedule the follow-up appointment.  It was a pleasure seeing you today here in the office. Please do not hesitate to give [place] a call back if you have any further questions. [place], BSN

## 2016-03-26 NOTE — Progress Notes (Signed)
Cardiology Office Note   Date:  03/27/2016   ID:  Albert Brown, DOB 11/08/1955, MRN [id number]  Referring Doctor:  [name], MD   Cardiologist:   Wende Bushy, MD   Reason for consultation:  Chief Complaint  Patient presents with  . OTHER    3 month f/u. Meds reviewed verball with pt.       History of Present Illness: Albert Brown is a 61 y.o. male who presents for follow-up for CAD  Since last visit, patient has been doing well. Denies chest pain and shortness of breath.  He does admit to poor eating habits. He would try to eat healthier but at times would eat pork skins and food that I high in fat and salt.  He tries to exercise but feels tired at the end of the day and therefore does not. Pt will try better.  He stll does not have PCP and was advised again on importance of getting one esp for routine physical exams, screening.   ROS:  Please see the history of present illness. Aside from mentioned under HPI, all other systems are reviewed and negative.    Past Medical History:  Diagnosis Date  . ALLERGIC RHINITIS   . Allergic sinusitis   . Anxiety state, unspecified   . CAD S/P percutaneous coronary angioplasty 08/08/2013   mRCA  Xience Alpine DES 2.75 mm x 33 mm (3.0 mm)  . Cerumen impaction, right   . DEPRESSION   . Diarrhea   . Erectile dysfunction   . EXTERNAL HEMORRHOIDS WITHOUT MENTION COMP   . GERD   . HIV DISEASE   . HIV infection ([place])   . HYPOGONADISM   . Non-ST elevated myocardial infarction (non-STEMI) ([facility]) 08/08/2013   SubAcute100% RCA -- PCI; Echo: EF 55-605, Normal LV Size & Function - ~mild HK of Inferior Myocardium with mildly reduced RV function.  Normal Valve function  . Periodontal disease   . PERIPHERAL NEUROPATHY   . PHARYNGITIS, RECURRENT   . Pterygium of left eye   . Urinary frequency   . VERTIGO     Past Surgical History:  Procedure Laterality Date  . LEFT HEART CATHETERIZATION WITH CORONARY ANGIOGRAM N/A  08/08/2013   Procedure: LEFT HEART CATHETERIZATION WITH CORONARY ANGIOGRAM;  Surgeon: [name], MD;  Location: [facility] CATH LAB;  Service: Cardiovascular;  Laterality: N/A;  . PERCUTANEOUS CORONARY STENT INTERVENTION (PCI-S)  08/08/2013   mRCA 100% --> Xience Alpine DES 2.75 mm x 33 mm --> 3.0 mm     reports that he quit smoking about 9 years ago. His smoking use included Cigarettes. He has never used smokeless tobacco. He reports that he drinks alcohol. He reports that he does not use drugs.   family history includes Cancer in his brother and mother; Osteosarcoma in his father; Prostate cancer in his brother, brother, and father; Rheum arthritis in his mother.   Outpatient Medications Prior to Visit  Medication Sig Dispense Refill  . aspirin EC 81 MG tablet Take 1 tablet (81 mg total) by mouth daily. 90 tablet 3  . clopidogrel (PLAVIX) 75 MG tablet Take 1 tablet (75 mg total) by mouth daily. 30 tablet 11  . emtricitabine-rilpivir-tenofovir AF (ODEFSEY) 200-25-25 MG TABS tablet Take 1 tablet by mouth daily with breakfast. 90 tablet 3  . Multiple Vitamin (MULTI VITAMIN MENS PO) Take 1 tablet by mouth daily.    . nitroGLYCERIN (NITROSTAT) 0.4 MG SL tablet Place 1 tablet (0.4 mg total) under  the tongue every 5 (five) minutes as needed for chest pain. 25 tablet 3  . simvastatin (ZOCOR) 20 MG tablet Take 1 tablet (20 mg total) by mouth daily at 6 PM. 30 tablet 6   No facility-administered medications prior to visit.      Allergies: Brilinta [ticagrelor]; Codeine; Bactrim [sulfamethoxazole-trimethoprim]; Ciprofloxacin; Dapsone; Pravastatin; and Sulfamethoxazole-trimethoprim    PHYSICAL EXAM: VS:  BP (!) 153/89 (BP Location: Left Arm, Patient Position: Sitting, Cuff Size: Normal)   Pulse 76   Ht 5' 8"  (1.727 m)   Wt 198 lb 8 oz (90 kg)   BMI 30.18 kg/m  , Body mass index is 30.18 kg/m. Wt Readings from Last 3 Encounters:  03/26/16 198 lb 8 oz (90 kg)  01/02/16 193 lb 12.8 oz (87.9 kg)    12/19/15 193 lb (87.5 kg)    GENERAL:  well developed, well nourished, obese, not in acute distress HEENT: normocephalic, pink conjunctivae, anicteric sclerae, no xanthelasma, normal dentition, oropharynx clear NECK:  no neck vein engorgement, JVP normal, no hepatojugular reflux, carotid upstroke brisk and symmetric, no bruit, no thyromegaly, no lymphadenopathy LUNGS:  good respiratory effort, clear to auscultation bilaterally CV:  PMI not displaced, no thrills, no lifts, S1 and S2 within normal limits, no palpable S3 or S4, no murmurs, no rubs, no gallops ABD:  Soft, nontender, nondistended, normoactive bowel sounds, no abdominal aortic bruit, no hepatomegaly, no splenomegaly MS: nontender back, no kyphosis, no scoliosis, no joint deformities EXT:  2+ DP/PT pulses, no edema, no varicosities, no cyanosis, no clubbing SKIN: warm, nondiaphoretic, normal turgor, no ulcers NEUROPSYCH: alert, oriented to person, place, and time, sensory/motor grossly intact, normal mood, appropriate affect    Recent Labs: 07/04/2015: ALT 20; BUN 19; Creat 1.10; Hemoglobin 15.4; Platelets 163; Potassium 4.5; Sodium 142   Lipid Panel    Component Value Date/Time   CHOL 179 12/01/2015 1045   TRIG 313 (H) 12/01/2015 1045   HDL 36 (L) 12/01/2015 1045   CHOLHDL 5.0 12/01/2015 1045   VLDL 63 (H) 12/01/2015 1045   LDLCALC 80 12/01/2015 1045     Other studies Reviewed:  EKG:  The ekg from 01/02/2016 was personally reviewed by me and it revealed sinus rhythm, 82 BPM.  Additional studies/ records that were reviewed personally reviewed by me today include:  Echocardiogram 08/08/2013: Left ventricle: The cavity size was normal. Systolic function was normal. The estimated ejection fraction was in the range of 55% to 60%. Possible mild hypokinesis of the inferior myocardium. - Right ventricle: Systolic function was mildly to moderately reduced.  Hemodynamics:   Central Aortic Pressure / Mean: 96/65/78  mmHg  Left Ventricular Pressure / LVEDP: 104/2/11 mmHg  Left Ventriculography:  EF: ~60-65%  Wall Motion: mild basal to mid inferior hypokinesis.  Coronary Anatomy:  Dominance: Right   Left Main: Large-caliber vessel that bifurcates into the LAD and nondominant Circumflex. Angiographically normal. There is a small, insignificant Ramus Intermedius.  LAD: Large-caliber vessel that gives off a major proximal first diagonal branch (D1) that courses almost is a Ramus Intermedius, followed by a smaller D2 and D3. The LAD reaches down to wrap the apex. Minimal (10-20%) luminal irregularities are noted at the branch point of significant diagonal vessels.  D1: Large-caliber vessel, angiographically normal  D2: Small-caliber vessel, angiographically normal.   D3: Moderate caliber vessel, angiographically normal  Left Circumflex: Large caliber vessel that essentially courses as a lateral OM branch with a very small AV groove branch and atrial branch. Minimal luminal irregularities.  RCA: Large-caliber, dominant vessel that has a early mid 70% stenosis followed by a tapering 100% thrombotic occlusion. Post PCI angiography reveals a long segment of 50-60% stenosis followed by a 70% stenosis prior to the crux. The vessel then normalizes into a relatively normal distal RCA bifurcating into the Right Posterior AV Groove Branch (RPAV) and small caliber RPDA. There is 1 RPL that is major.  Nuclear stress is 01/15/2016:  Blood pressure demonstrated a normal response to exercise.  There was no ST segment deviation noted during stress.  The study is normal.  This is a low risk study.  The left ventricular ejection fraction is normal (55-65%).  ASSESSMENT AND PLAN: CAD status post PTCA, drug-eluting stent to mid RCA History of NSTEMI No angina. No evidence of ischemia on stress test. Pt reassured. Cont medical therapy: ASA 63m po qd, simvastatin. He is due for labs.  Elevated blood  pressure Strongly advised to do BP log. Pt adamant that he does not want to start any meds for this. Emphasized importanc eof good BP control and risks of untreatred BP. Pt verbalized understanding.  Hyperlipidemia LDL goal less than 70. We'll be repeating lipid panel and CMP. Add TSH and free T4.  Current medicines are reviewed at length with the patient today.  The patient does not have concerns regarding medicines.  Labs/ tests ordered today include:  Orders Placed This Encounter  Procedures  . Lipid panel  . Comp Met (CMET)  . TSH  . T4, free   I had a lengthy and detailed discussion with the patient regarding diagnoses, prognosis, diagnostic options, treatment options , and side effects of medications.   I counseled the patient on importance of lifestyle modification including heart healthy diet, regular physical activity .  Disposition:   FU with undersigned in 6 months   Signed, AWende Bushy MD  03/27/2016 10:33 AM    [place] This note was generated in part with voice recognition software and I apologize for any typographical errors that were not detected and corrected.

## 2016-03-28 ENCOUNTER — Ambulatory Visit: Payer: BLUE CROSS/BLUE SHIELD

## 2016-03-28 DIAGNOSIS — F331 Major depressive disorder, recurrent, moderate: Secondary | ICD-10-CM
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
## 2016-04-04 ENCOUNTER — Encounter (HOSPITAL_COMMUNITY): Payer: Self-pay | Admitting: Emergency Medicine

## 2016-04-04 ENCOUNTER — Ambulatory Visit: Payer: BLUE CROSS/BLUE SHIELD

## 2016-04-04 ENCOUNTER — Ambulatory Visit (HOSPITAL_COMMUNITY)
Admission: EM | Admit: 2016-04-04 | Discharge: 2016-04-04 | Disposition: A | Discharge status: Home / Self Care | Payer: BLUE CROSS/BLUE SHIELD | Attending: Emergency Medicine | Admitting: Emergency Medicine

## 2016-04-04 DIAGNOSIS — R6889 Other general symptoms and signs: Secondary | ICD-10-CM | POA: Diagnosis not present

## 2016-04-04 MED ORDER — IBUPROFEN 800 MG PO TABS: 800.0000 mg | ORAL_TABLET | Freq: Three times a day (TID) | ORAL | 0 refills | Status: DC

## 2016-04-04 MED ORDER — ONDANSETRON 4 MG PO TBDP: 4.0000 mg | ORAL_TABLET | Freq: Three times a day (TID) | ORAL | 0 refills | Status: DC | PRN

## 2016-04-04 MED ORDER — AEROCHAMBER PLUS MISC: 2 refills | Status: DC

## 2016-04-04 MED ORDER — FLUTICASONE PROPIONATE 50 MCG/ACT NA SUSP: 2.0000 | Freq: Every day | NASAL | 0 refills | Status: DC

## 2016-04-04 MED ORDER — ALBUTEROL SULFATE HFA 108 (90 BASE) MCG/ACT IN AERS: 1.0000 | INHALATION_SPRAY | Freq: Four times a day (QID) | RESPIRATORY_TRACT | 0 refills | Status: DC | PRN

## 2016-04-04 MED ORDER — HYDROCOD POLST-CPM POLST ER 10-8 MG/5ML PO SUER: 5.0000 mL | Freq: Two times a day (BID) | ORAL | 0 refills | Status: DC | PRN

## 2016-04-04 MED ORDER — OSELTAMIVIR PHOSPHATE 75 MG PO CAPS: 75.0000 mg | ORAL_CAPSULE | Freq: Two times a day (BID) | ORAL | 0 refills | Status: DC

## 2016-04-04 NOTE — ED Triage Notes (Signed)
Pt had a sore throat two days ago.  He has since started suffering from chills relieved by aspirin, body aches, a headache, nasal congestion and drainage, and a cough.

## 2016-04-04 NOTE — ED Provider Notes (Signed)
HPI  SUBJECTIVE:  Albert Brown is a 61 y.o. male who presents with 3 days of body aches, diffuse headache, fatigue, chills and states that he feels feverish, has no documented fevers at home as he does not have a thermometer. He reports postnasal drip and a cough occasionally productive of yellowish sputum, wheezing. He reports chest soreness secondary to the cough and states he has difficulty sleeping at night due to this. denies chest pain or shortness of breath. He reports nasal congestion, clear rhinorrhea starting today. decreased appetite, but is tolerating by mouth. He tried aspirin and Alka-Seltzer cold plus which improved his symptoms. Symptoms are worse with walking around. He reports nausea, no vomiting or diarrhea. No ear pain, no purulent nasal drainage, dental pain, neck stiffness, photophobia, rash, abdominal pain, urinary complaints. No skin infection. He did get a flu shot this year. He thinks that he has been around several contacts with the flu. No antipyretic in the past 6-8 hours. Hypertension, diabetes, and terribly, kidney disease, UTI, lung disease. PMD: Dr. Johnnye Sima.  Past Medical History:  Diagnosis Date  . ALLERGIC RHINITIS   . Allergic sinusitis   . Anxiety state, unspecified   . CAD S/P percutaneous coronary angioplasty 08/08/2013   mRCA  Xience Alpine DES 2.75 mm x 33 mm (3.0 mm)  . Cerumen impaction, right   . DEPRESSION   . Diarrhea   . Erectile dysfunction   . EXTERNAL HEMORRHOIDS WITHOUT MENTION COMP   . GERD   . HIV DISEASE   . HIV infection ([place])   . HYPOGONADISM   . Non-ST elevated myocardial infarction (non-STEMI) ([place] [facility]) 08/08/2013   SubAcute100% RCA -- PCI; Echo: EF 55-605, Normal LV Size & Function - ~mild HK of Inferior Myocardium with mildly reduced RV function.  Normal Valve function  . Periodontal disease   . PERIPHERAL NEUROPATHY   . PHARYNGITIS, RECURRENT   . Pterygium of left eye   . Urinary frequency   . VERTIGO     Past Surgical  History:  Procedure Laterality Date  . LEFT HEART CATHETERIZATION WITH CORONARY ANGIOGRAM N/A 08/08/2013   Procedure: LEFT HEART CATHETERIZATION WITH CORONARY ANGIOGRAM;  Surgeon: [name], MD;  Location: [facility] CATH LAB;  Service: Cardiovascular;  Laterality: N/A;  . PERCUTANEOUS CORONARY STENT INTERVENTION (PCI-S)  08/08/2013   mRCA 100% --> Xience Alpine DES 2.75 mm x 33 mm --> 3.0 mm    Family History  Problem Relation Age of Onset  . Prostate cancer Father   . Osteosarcoma Father   . Prostate cancer Brother   . Prostate cancer Brother   . Cancer Brother   . Cancer Mother     brain tumor  . Rheum arthritis Mother     Social History  Substance Use Topics  . Smoking status: Former Smoker    Types: Cigarettes    Quit date: 03/02/2007  . Smokeless tobacco: Never Used     Comment: pt. no longer smokes  . Alcohol use Yes     Comment: weekends    No current facility-administered medications for this encounter.   Current Outpatient Prescriptions:  .  aspirin EC 81 MG tablet, Take 1 tablet (81 mg total) by mouth daily., Disp: 90 tablet, Rfl: 3 .  emtricitabine-rilpivir-tenofovir AF (ODEFSEY) 200-25-25 MG TABS tablet, Take 1 tablet by mouth daily with breakfast., Disp: 90 tablet, Rfl: 3 .  Multiple Vitamin (MULTI VITAMIN MENS PO), Take 1 tablet by mouth daily., Disp: , Rfl:  .  simvastatin (ZOCOR)  20 MG tablet, Take 1 tablet (20 mg total) by mouth daily at 6 PM., Disp: 30 tablet, Rfl: 6 .  albuterol (PROVENTIL HFA;VENTOLIN HFA) 108 (90 Base) MCG/ACT inhaler, Inhale 1-2 puffs into the lungs every 6 (six) hours as needed for wheezing or shortness of breath., Disp: 1 Inhaler, Rfl: 0 .  chlorpheniramine-HYDROcodone (TUSSIONEX PENNKINETIC ER) 10-8 MG/5ML SUER, Take 5 mLs by mouth every 12 (twelve) hours as needed for cough., Disp: 120 mL, Rfl: 0 .  fluticasone (FLONASE) 50 MCG/ACT nasal spray, Place 2 sprays into both nostrils daily., Disp: 16 g, Rfl: 0 .  ibuprofen (ADVIL,MOTRIN) 800 MG  tablet, Take 1 tablet (800 mg total) by mouth 3 (three) times daily., Disp: 30 tablet, Rfl: 0 .  nitroGLYCERIN (NITROSTAT) 0.4 MG SL tablet, Place 1 tablet (0.4 mg total) under the tongue every 5 (five) minutes as needed for chest pain., Disp: 25 tablet, Rfl: 3 .  ondansetron (ZOFRAN ODT) 4 MG disintegrating tablet, Take 1 tablet (4 mg total) by mouth every 8 (eight) hours as needed for nausea or vomiting., Disp: 20 tablet, Rfl: 0 .  oseltamivir (TAMIFLU) 75 MG capsule, Take 1 capsule (75 mg total) by mouth 2 (two) times daily. X 5 days, Disp: 10 capsule, Rfl: 0 .  Spacer/Aero-Holding Chambers (AEROCHAMBER PLUS) inhaler, Use as instructed, Disp: 1 each, Rfl: 2  Allergies  Allergen Reactions  . Brilinta [Ticagrelor] Shortness Of Breath    BREATHING ISSUES  . Codeine Anaphylaxis  . Bactrim [Sulfamethoxazole-Trimethoprim]     "Pt stated he died when taking this"  . Ciprofloxacin     REACTION: rash  . Dapsone     REACTION: severe-anaphylaxis  . Pravastatin Other (See Comments)    Muscle cramps  . Sulfamethoxazole-Trimethoprim     Anaphylaxis      ROS  As noted in HPI.   Physical Exam  BP 146/97 (BP Location: Left Arm)   Pulse 95   Temp 98.1 F (36.7 C) (Oral)   SpO2 97%   Constitutional: Well developed, well nourished, no acute distress Eyes: PERRL, EOMI, conjunctiva normal bilaterally HENT: Normocephalic, atraumatic,mucus membranes moist. TMs normal bilaterally. Mild nasal congestion. Erythematous, mildly swollen turbinates. No sinus tenderness. Slightly erythematous oropharynx, normal tonsils, uvula midline. Positive cobblestoning, postnasal drip. Neck: No cervical lymphadenopathy, meningismus Respiratory: Clear to auscultation bilaterally, no rales, no wheezing, no rhonchi. Positive diffuse chest wall tenderness Cardiovascular: Normal rate and rhythm, no murmurs, no gallops, no rubs GI: Soft, nondistended, normal bowel sounds, nontender, no rebound, no guarding Back: no  CVAT skin: No rash, skin intact Musculoskeletal: No edema, no tenderness, no deformities Neurologic: Alert & oriented x 3, CN II-XII grossly intact, no motor deficits, sensation grossly intact Psychiatric: Speech and behavior appropriate   ED Course   Medications - No data to display  No orders of the defined types were placed in this encounter.  No results found for this or any previous visit (from the past 24 hour(s)). No results found.  ED Clinical Impression  Flu-like symptoms   ED Assessment/Plan   presentation most consistent with an influenza-like illness. No Evidence of sinusitis, otitis, strep pharyngitis, meningitis, pneumonia, intra-abdominal process. Given that he has HIV and cardiac disease we'll send him home with Tamiflu and  supportive treatment including albuterol with spacer, Zofran, ibuprofen 800 mg, Tussionex, saline nasal irrigation and Flonase. Writing a work note so that he can return to work on Tuesday. Allergy to codeine noted, patient states that he thinks that he is okay with hydrocodone  and is willing to try it. Follow-up with PMD as needed. To The ER if he gets worse. Discussed medical decision-making, plan for follow-up, signs and symptoms that should prompt return to the emergency department. Patient agrees with plan.   Meds ordered this encounter  Medications  . chlorpheniramine-HYDROcodone (TUSSIONEX PENNKINETIC ER) 10-8 MG/5ML SUER    Sig: Take 5 mLs by mouth every 12 (twelve) hours as needed for cough.    Dispense:  120 mL    Refill:  0  . fluticasone (FLONASE) 50 MCG/ACT nasal spray    Sig: Place 2 sprays into both nostrils daily.    Dispense:  16 g    Refill:  0  . albuterol (PROVENTIL HFA;VENTOLIN HFA) 108 (90 Base) MCG/ACT inhaler    Sig: Inhale 1-2 puffs into the lungs every 6 (six) hours as needed for wheezing or shortness of breath.    Dispense:  1 Inhaler    Refill:  0  . ibuprofen (ADVIL,MOTRIN) 800 MG tablet    Sig: Take 1 tablet  (800 mg total) by mouth 3 (three) times daily.    Dispense:  30 tablet    Refill:  0  . Spacer/Aero-Holding Chambers (AEROCHAMBER PLUS) inhaler    Sig: Use as instructed    Dispense:  1 each    Refill:  2  . oseltamivir (TAMIFLU) 75 MG capsule    Sig: Take 1 capsule (75 mg total) by mouth 2 (two) times daily. X 5 days    Dispense:  10 capsule    Refill:  0  . ondansetron (ZOFRAN ODT) 4 MG disintegrating tablet    Sig: Take 1 tablet (4 mg total) by mouth every 8 (eight) hours as needed for nausea or vomiting.    Dispense:  20 tablet    Refill:  0    *This clinic note was created using [employer]. Therefore, there may be occasional mistakes despite careful proofreading.  ?   Melynda Ripple, MD 04/05/16 [phone]

## 2016-04-04 NOTE — Discharge Instructions (Signed)
Start some saline nasal irrigation with your Albert Brown med sinus rinse and start the Flonase. You may try Mucinex D if the nasal congestion becomes very bothersome.

## 2016-04-08 ENCOUNTER — Other Ambulatory Visit (INDEPENDENT_AMBULATORY_CARE_PROVIDER_SITE_OTHER): Payer: BLUE CROSS/BLUE SHIELD

## 2016-04-08 DIAGNOSIS — Z9861 Coronary angioplasty status: Secondary | ICD-10-CM | POA: Diagnosis not present

## 2016-04-08 DIAGNOSIS — I2510 Atherosclerotic heart disease of native coronary artery without angina pectoris: Secondary | ICD-10-CM

## 2016-04-09 LAB — COMPREHENSIVE METABOLIC PANEL
ALBUMIN: 4.4 g/dL (ref 3.5–5.5)
ALT: 21 IU/L (ref 0–44)
AST: 15 IU/L (ref 0–40)
Albumin/Globulin Ratio: 1.6 (ref 1.2–2.2)
Alkaline Phosphatase: 107 IU/L (ref 39–117)
BILIRUBIN TOTAL: 0.5 mg/dL (ref 0.0–1.2)
BUN / CREAT RATIO: 16 (ref 9–20)
BUN: 20 mg/dL (ref 6–24)
CALCIUM: 9.1 mg/dL (ref 8.7–10.2)
CO2: 21 mmol/L (ref 18–29)
CREATININE: 1.23 mg/dL (ref 0.76–1.27)
Chloride: 103 mmol/L (ref 96–106)
GFR, EST AFRICAN AMERICAN: 74 mL/min/{1.73_m2} (ref >59)
GFR, EST NON AFRICAN AMERICAN: 64 mL/min/{1.73_m2} (ref >59)
GLUCOSE: 101 mg/dL — AB (ref 65–99)
Globulin, Total: 2.8 g/dL (ref 1.5–4.5)
Potassium: 4.3 mmol/L (ref 3.5–5.2)
Sodium: 142 mmol/L (ref 134–144)
TOTAL PROTEIN: 7.2 g/dL (ref 6.0–8.5)

## 2016-04-09 LAB — LIPID PANEL
Chol/HDL Ratio: 3.4 ratio units (ref 0.0–5.0)
Cholesterol, Total: 112 mg/dL (ref 100–199)
HDL: 33 mg/dL — AB (ref >39)
LDL Calculated: 58 mg/dL (ref 0–99)
Triglycerides: 106 mg/dL (ref 0–149)
VLDL Cholesterol Cal: 21 mg/dL (ref 5–40)

## 2016-04-09 LAB — T4, FREE: Free T4: 1.05 ng/dL (ref 0.82–1.77)

## 2016-04-09 LAB — TSH: TSH: 4.21 u[IU]/mL (ref 0.450–4.500)

## 2016-04-11 ENCOUNTER — Ambulatory Visit: Payer: BLUE CROSS/BLUE SHIELD

## 2016-04-11 DIAGNOSIS — F331 Major depressive disorder, recurrent, moderate: Secondary | ICD-10-CM

## 2016-04-11 NOTE — BH Specialist Note (Signed)
Albert Brown talked about having the flu last week and how sick he was. He is much better today and was in a pleasant mood. He talked about how he is interested in nutrition and is trying to eat better so he can lose some weight and feel better. Plan to meet again in one week. [name], LCSW

## 2016-04-18 ENCOUNTER — Ambulatory Visit: Payer: BLUE CROSS/BLUE SHIELD

## 2016-04-18 DIAGNOSIS — F330 Major depressive disorder, recurrent, mild: Secondary | ICD-10-CM

## 2016-04-18 NOTE — BH Specialist Note (Signed)
Albert Brown was in a fairly good mood today, but reported that he has notice that he gets angry quickly lately. I talked with him about this and mentioned sleep. He said he never feels like he gets enough sleep. He also talked about how he wants to find someone to be in a relationship with, but hasn't had any luck with the [email or web address] website. Plan to meet again in one week. [name], LCSW

## 2016-04-25 ENCOUNTER — Ambulatory Visit: Payer: BLUE CROSS/BLUE SHIELD

## 2016-04-25 DIAGNOSIS — F331 Major depressive disorder, recurrent, moderate: Secondary | ICD-10-CM

## 2016-04-25 NOTE — BH Specialist Note (Signed)
Albert Brown and I talked about ways for him to increase socialization and meet someone. He is not interested in the "hook up" sites online, but rather wants to meet someone he can go on a date with. I gave him several suggestions. He is very hesitant to make changes in his life, but spends a great deal of time complaining about what he doesn't have in his life. Plan to meet again in one week. [name], LCSW

## 2016-05-02 ENCOUNTER — Ambulatory Visit: Payer: BLUE CROSS/BLUE SHIELD

## 2016-05-09 ENCOUNTER — Ambulatory Visit: Payer: BLUE CROSS/BLUE SHIELD

## 2016-05-09 ENCOUNTER — Other Ambulatory Visit (HOSPITAL_COMMUNITY)
Admission: RE | Admit: 2016-05-09 | Discharge: 2016-05-09 | Disposition: A | Discharge status: Home / Self Care | Payer: BLUE CROSS/BLUE SHIELD | Source: Ambulatory Visit | Attending: Infectious Diseases | Admitting: Infectious Diseases

## 2016-05-09 ENCOUNTER — Other Ambulatory Visit: Payer: BLUE CROSS/BLUE SHIELD

## 2016-05-09 DIAGNOSIS — F331 Major depressive disorder, recurrent, moderate: Secondary | ICD-10-CM

## 2016-05-09 DIAGNOSIS — E785 Hyperlipidemia, unspecified: Secondary | ICD-10-CM

## 2016-05-09 DIAGNOSIS — Z113 Encounter for screening for infections with a predominantly sexual mode of transmission: Secondary | ICD-10-CM | POA: Insufficient documentation

## 2016-05-09 DIAGNOSIS — B20 Human immunodeficiency virus [HIV] disease: Secondary | ICD-10-CM

## 2016-05-09 LAB — CBC
HEMATOCRIT: 45.1 % (ref 38.5–50.0)
HEMOGLOBIN: 15.1 g/dL (ref 13.2–17.1)
MCH: 31.2 pg (ref 27.0–33.0)
MCHC: 33.5 g/dL (ref 32.0–36.0)
MCV: 93.2 fL (ref 80.0–100.0)
MPV: 10.8 fL (ref 7.5–12.5)
Platelets: 162 10*3/uL (ref 140–400)
RBC: 4.84 MIL/uL (ref 4.20–5.80)
RDW: 13.3 % (ref 11.0–15.0)
WBC: 6.4 10*3/uL (ref 3.8–10.8)

## 2016-05-09 NOTE — BH Specialist Note (Signed)
Rob was in a pleasant mood and reported that he recently found out about an LGBT "meetup" group in [place]. We looked it up online so that he would know the next meeting, which is this weekend. Plan to meet again in 2 weeks. [name], LCSW

## 2016-05-10 LAB — COMPREHENSIVE METABOLIC PANEL
ALBUMIN: 4.2 g/dL (ref 3.6–5.1)
ALK PHOS: 84 U/L (ref 40–115)
ALT: 23 U/L (ref 9–46)
AST: 18 U/L (ref 10–35)
BILIRUBIN TOTAL: 0.4 mg/dL (ref 0.2–1.2)
BUN: 22 mg/dL (ref 7–25)
CALCIUM: 9.9 mg/dL (ref 8.6–10.3)
CO2: 24 mmol/L (ref 20–31)
Chloride: 108 mmol/L (ref 98–110)
Creat: 1.24 mg/dL (ref 0.70–1.33)
GLUCOSE: 104 mg/dL — AB (ref 65–99)
POTASSIUM: 4.5 mmol/L (ref 3.5–5.3)
Sodium: 142 mmol/L (ref 135–146)
TOTAL PROTEIN: 7 g/dL (ref 6.1–8.1)

## 2016-05-10 LAB — LIPID PANEL
CHOL/HDL RATIO: 3.4 ratio (ref <5.0)
CHOLESTEROL: 125 mg/dL (ref <200)
HDL: 37 mg/dL — AB (ref >40)
LDL Cholesterol: 59 mg/dL (ref <100)
Triglycerides: 147 mg/dL (ref <150)
VLDL: 29 mg/dL (ref <30)

## 2016-05-10 LAB — T-HELPER CELL (CD4) - (RCID CLINIC ONLY)
CD4 % Helper T Cell: 33 % (ref 33–55)
CD4 T Cell Abs: 600 /uL (ref 400–2700)

## 2016-05-10 LAB — RPR

## 2016-05-11 LAB — HIV-1 RNA QUANT-NO REFLEX-BLD
HIV 1 RNA Quant: <20 copies/mL — AB
HIV-1 RNA Quant, Log: <1.3 Log copies/mL — AB

## 2016-05-13 LAB — URINE CYTOLOGY ANCILLARY ONLY
CHLAMYDIA, DNA PROBE: NEGATIVE
NEISSERIA GONORRHEA: NEGATIVE

## 2016-05-21 ENCOUNTER — Other Ambulatory Visit: Payer: BLUE CROSS/BLUE SHIELD

## 2016-05-23 ENCOUNTER — Ambulatory Visit: Payer: BLUE CROSS/BLUE SHIELD

## 2016-05-23 DIAGNOSIS — F331 Major depressive disorder, recurrent, moderate: Secondary | ICD-10-CM
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
## 2016-05-30 ENCOUNTER — Ambulatory Visit: Payer: BLUE CROSS/BLUE SHIELD | Admitting: Infectious Diseases

## 2016-05-30 ENCOUNTER — Ambulatory Visit: Payer: BLUE CROSS/BLUE SHIELD

## 2016-05-30 DIAGNOSIS — F331 Major depressive disorder, recurrent, moderate: Secondary | ICD-10-CM

## 2016-05-30 NOTE — BH Specialist Note (Signed)
Albert Brown talked about how fortunate he is that his health is good - he just got his lab results and they were all good. He then talked about his frustrations with relationships. I talked to him about changing his attitude and behavior from spending all his energy on what he doesn't have to putting his energy into what he wants in life. He liked this advice and we agreed to work on this in future sessions. Plan to meet again in 2 weeks. [name], LCSW

## 2016-06-04 ENCOUNTER — Ambulatory Visit: Payer: BLUE CROSS/BLUE SHIELD | Admitting: Infectious Diseases

## 2016-06-13 ENCOUNTER — Ambulatory Visit (INDEPENDENT_AMBULATORY_CARE_PROVIDER_SITE_OTHER): Payer: BLUE CROSS/BLUE SHIELD

## 2016-06-13 DIAGNOSIS — F331 Major depressive disorder, recurrent, moderate: Secondary | ICD-10-CM | POA: Diagnosis not present

## 2016-06-13 NOTE — BH Specialist Note (Signed)
Rob was in a pleasant mood today, but complained about his job. He said his feet are starting to hurt and he needs to see a podiatrist. He doesn't think he can keep working a job where he is on his feet all day. I informed him of my leaving. Plan to meet again in 2 weeks. [name], LCSW

## 2016-06-20 ENCOUNTER — Ambulatory Visit: Payer: BLUE CROSS/BLUE SHIELD

## 2016-06-20 ENCOUNTER — Telehealth: Payer: Self-pay | Admitting: Lab

## 2016-06-20 NOTE — Telephone Encounter (Signed)
06/13/16-The patient asked me for a podiatry referral.  I gave him [facility]. [address].  [phone].

## 2016-06-27 ENCOUNTER — Ambulatory Visit (INDEPENDENT_AMBULATORY_CARE_PROVIDER_SITE_OTHER): Payer: BLUE CROSS/BLUE SHIELD

## 2016-06-27 DIAGNOSIS — F331 Major depressive disorder, recurrent, moderate: Secondary | ICD-10-CM

## 2016-06-27 NOTE — BH Specialist Note (Signed)
Rob was in a fairly pleasant mood today, talking some about his ongoing struggle with increasing his social connections and asking about LGBT support groups in the area. We also talked some about termination and I gave him the letter from [facility] re: follow up treatment there. He expressed his gratitude to me for listening without judgement over the years. Plan to meet one last time next week. [name], LCSW

## 2016-07-04 ENCOUNTER — Ambulatory Visit: Payer: BLUE CROSS/BLUE SHIELD

## 2016-07-04 DIAGNOSIS — F331 Major depressive disorder, recurrent, moderate: Secondary | ICD-10-CM

## 2016-07-04 NOTE — BH Specialist Note (Signed)
Albert Brown was in a good mood today, reporting that he talked to a friend about his foot hurting and then went online and figured out that it is plantar fasciitis. He found some exercises to do and it is getting better, he said. We discussed termination and he is considering going to [facility] or possibly seeing me in [place].  He thanked me for listening to him and helping him. [name], LCSW

## 2016-07-29 ENCOUNTER — Ambulatory Visit: Payer: BLUE CROSS/BLUE SHIELD | Admitting: Infectious Diseases

## 2016-07-31 ENCOUNTER — Encounter: Payer: Self-pay | Admitting: Infectious Diseases

## 2016-07-31 ENCOUNTER — Ambulatory Visit (INDEPENDENT_AMBULATORY_CARE_PROVIDER_SITE_OTHER): Payer: BLUE CROSS/BLUE SHIELD | Admitting: Infectious Diseases

## 2016-07-31 VITALS — BP 151/74 | HR 73 | Temp 98.3°F | Ht 68.0 in | Wt 191.0 lb

## 2016-07-31 DIAGNOSIS — B20 Human immunodeficiency virus [HIV] disease: Secondary | ICD-10-CM

## 2016-07-31 DIAGNOSIS — Z789 Other specified health status: Secondary | ICD-10-CM | POA: Diagnosis not present

## 2016-07-31 DIAGNOSIS — J309 Allergic rhinitis, unspecified: Secondary | ICD-10-CM

## 2016-07-31 DIAGNOSIS — Z113 Encounter for screening for infections with a predominantly sexual mode of transmission: Secondary | ICD-10-CM | POA: Diagnosis not present

## 2016-07-31 DIAGNOSIS — Z79899 Other long term (current) drug therapy: Secondary | ICD-10-CM | POA: Diagnosis not present

## 2016-07-31 DIAGNOSIS — I214 Non-ST elevation (NSTEMI) myocardial infarction: Secondary | ICD-10-CM | POA: Diagnosis not present

## 2016-07-31 NOTE — Progress Notes (Signed)
   Subjective:    Patient ID: Albert Brown, male    DOB: 21-Aug-1955, 61 y.o.   MRN: [id number]  HPI 61 yo M with HIV+ (on triumeq --> complera--> odefsy)  He presented to [facility] on 08/07/2013 and was found to have acute total occlusion of the mid RCA. This was successfully treated with PCI plus a drug-eluting stent. He had well-preserved left ventricular systolic function (EF 02-11%). There was mild basilar to mid inferior hypokinesis.   He is on ASA and zocor.   Wants to go back on hormone therapy. Decreased libido, energy. Increased weight. moodyt.  HIV 1 RNA Quant (copies/mL)  Date Value  05/09/2016 <20 DETECTED (A)  11/30/2015 <20  07/04/2015 <20   CD4 T Cell Abs (/uL)  Date Value  05/09/2016 600  11/30/2015 530  07/04/2015 480    Review of Systems  Constitutional: Positive for fatigue and unexpected weight change. Negative for appetite change.  HENT: Positive for postnasal drip and rhinorrhea.   Respiratory: Positive for shortness of breath. Negative for cough.   Cardiovascular: Positive for leg swelling. Negative for chest pain.  Gastrointestinal: Negative for constipation and diarrhea.  Genitourinary: Negative for difficulty urinating.  thinks he gets SOB as he forgets to breathe.  Plantar fascitis L heel. Has not seen podiatry yet.  Please see HPI. 12 point ROS o/w (-)     Objective:   Physical Exam  Constitutional: He appears well-developed and well-nourished.  HENT:  Mouth/Throat: No oropharyngeal exudate.  Eyes: EOM are normal. Pupils are equal, round, and reactive to light.  Neck: Neck supple.  Cardiovascular: Normal rate, regular rhythm and normal heart sounds.   Pulmonary/Chest: Effort normal and breath sounds normal.  Abdominal: Soft. Bowel sounds are normal. There is no tenderness. There is no rebound.  Musculoskeletal: He exhibits edema.  Lymphadenopathy:    He has no cervical adenopathy.      Assessment & Plan:

## 2016-07-31 NOTE — Assessment & Plan Note (Signed)
Suggested flonase

## 2016-07-31 NOTE — Assessment & Plan Note (Signed)
He is doing well.  Has aged out of mening.  Other vax uptodate.  Will see him back in 9 months with labs.  Offered/refused condoms.

## 2016-07-31 NOTE — Assessment & Plan Note (Signed)
Some worry about his worsening LE edema.  Will have him f/u with CV.

## 2016-09-05 ENCOUNTER — Other Ambulatory Visit: Payer: Self-pay | Admitting: *Deleted

## 2016-09-05 ENCOUNTER — Telehealth: Payer: Self-pay | Admitting: Cardiology

## 2016-09-05 ENCOUNTER — Other Ambulatory Visit: Payer: Self-pay

## 2016-09-05 DIAGNOSIS — I2510 Atherosclerotic heart disease of native coronary artery without angina pectoris: Secondary | ICD-10-CM

## 2016-09-05 DIAGNOSIS — E785 Hyperlipidemia, unspecified: Secondary | ICD-10-CM

## 2016-09-05 DIAGNOSIS — Z9861 Coronary angioplasty status: Principal | ICD-10-CM

## 2016-09-05 MED ORDER — SIMVASTATIN 20 MG PO TABS: 20.0000 mg | ORAL_TABLET | Freq: Every day | ORAL | 0 refills | Status: DC

## 2016-09-05 NOTE — Telephone Encounter (Signed)
Pharmacy request for refill medication listed below: Please advise if ok to refill.

## 2016-09-05 NOTE — Telephone Encounter (Signed)
Patient last refill sent to wrong pharmacy    *STAT* If patient is at the pharmacy, call can be transferred to refill team.   1. Which medications need to be refilled? (please list name of each medication and dose if known) simvastatin 20 mg po q night   2. Which pharmacy/location (including street and city if local pharmacy) is medication to be sent to? [facility] [address] [place]   3. Do they need a 30 day or 90 day supply? [place]

## 2016-09-06 ENCOUNTER — Other Ambulatory Visit: Payer: Self-pay

## 2016-09-06 DIAGNOSIS — I2510 Atherosclerotic heart disease of native coronary artery without angina pectoris: Secondary | ICD-10-CM

## 2016-09-06 DIAGNOSIS — E785 Hyperlipidemia, unspecified: Secondary | ICD-10-CM

## 2016-09-06 DIAGNOSIS — Z9861 Coronary angioplasty status: Principal | ICD-10-CM

## 2016-09-06 MED ORDER — SIMVASTATIN 20 MG PO TABS: 20.0000 mg | ORAL_TABLET | Freq: Every day | ORAL | 0 refills | Status: DC

## 2016-09-06 NOTE — Telephone Encounter (Signed)
Requested Prescriptions   Signed Prescriptions Disp Refills  . simvastatin (ZOCOR) 20 MG tablet 90 tablet 0    Sig: Take 1 tablet (20 mg total) by mouth daily at 6 PM.    Authorizing Provider: Minna Merritts    Ordering User: Janan Ridge

## 2016-09-10 ENCOUNTER — Telehealth: Payer: Self-pay | Admitting: Cardiology

## 2016-09-10 NOTE — Telephone Encounter (Signed)
-----   Message from Janan Ridge, [place] sent at 09/06/2016 10:52 AM EDT ----- Will you try to schedule an appointment was an ingal patient last seen 02/2016.

## 2016-09-10 NOTE — Telephone Encounter (Signed)
Lmov for patient to call back and schedule appointment here for refills Will try again at a later time

## 2016-09-12 NOTE — Telephone Encounter (Signed)
Pt coming on 09/25/16 to see Dr End

## 2016-09-13 ENCOUNTER — Other Ambulatory Visit: Payer: Self-pay | Admitting: *Deleted

## 2016-09-13 DIAGNOSIS — Z9861 Coronary angioplasty status: Principal | ICD-10-CM

## 2016-09-13 DIAGNOSIS — E785 Hyperlipidemia, unspecified: Secondary | ICD-10-CM

## 2016-09-13 DIAGNOSIS — I2510 Atherosclerotic heart disease of native coronary artery without angina pectoris: Secondary | ICD-10-CM

## 2016-09-13 MED ORDER — SIMVASTATIN 20 MG PO TABS
20.0000 mg | ORAL_TABLET | Freq: Every day | ORAL | 0 refills | Status: DC
Start: 2016-09-13 — End: 2016-09-23

## 2016-09-23 ENCOUNTER — Other Ambulatory Visit: Payer: Self-pay

## 2016-09-23 DIAGNOSIS — Z9861 Coronary angioplasty status: Principal | ICD-10-CM

## 2016-09-23 DIAGNOSIS — I2510 Atherosclerotic heart disease of native coronary artery without angina pectoris: Secondary | ICD-10-CM

## 2016-09-23 DIAGNOSIS — E785 Hyperlipidemia, unspecified: Secondary | ICD-10-CM

## 2016-09-23 MED ORDER — SIMVASTATIN 20 MG PO TABS
20.0000 mg | ORAL_TABLET | Freq: Every day | ORAL | 0 refills | Status: DC
Start: 2016-09-23 — End: 2017-07-16

## 2016-09-25 ENCOUNTER — Encounter: Payer: Self-pay | Admitting: Internal Medicine

## 2016-09-25 ENCOUNTER — Ambulatory Visit (INDEPENDENT_AMBULATORY_CARE_PROVIDER_SITE_OTHER): Payer: BLUE CROSS/BLUE SHIELD | Admitting: Internal Medicine

## 2016-09-25 VITALS — BP 140/80 | HR 63 | Ht 68.0 in | Wt 192.5 lb

## 2016-09-25 DIAGNOSIS — E785 Hyperlipidemia, unspecified: Secondary | ICD-10-CM

## 2016-09-25 DIAGNOSIS — I25119 Atherosclerotic heart disease of native coronary artery with unspecified angina pectoris: Secondary | ICD-10-CM

## 2016-09-25 DIAGNOSIS — I10 Essential (primary) hypertension: Secondary | ICD-10-CM

## 2016-09-25 NOTE — Progress Notes (Signed)
Follow-up Outpatient Visit Date: 09/25/2016  Primary Care Provider: Campbell Riches, MD [address] [place] [address]  Chief Complaint: Follow-up coronary artery disease  HPI:  Albert Brown is a 61 y.o. year-old male with history of coronary artery disease with NSTEMI in 07/2013 status post DES to the mid RCA, HIV, and GERD who presents for follow-up of coronary artery disease. He was previously seen in our office by Dr. [name], most recently January. Today, he reports feeling about the same as at that visit. He still has some fatigue which has been present for about a year. He is not sleeping well and has also gained some weight. He is not sure why he has put on weight but does not believe it is edema. He notes occasional swelling in his ankles. He notes occasional dull pain adjacent to his sternum that can come on randomly. It is mild and lasts a few minutes before resolving spontaneously. He does not have any exertional chest pain or shortness of breath. Of note, at the time of his NSTEMI in 2015, he had significant pain in both arms and heaviness in his legs. He has not felt anything reminiscent of this. He has not needed to use nitroglycerin. He also denies palpitations, lightheadedness, orthopnea, and PND. He does not add salt to his food but eats out quite a bit. He is also on his feet a lot at work. Albert Brown on his feet a lot at work but does not exercise regularly.  --------------------------------------------------------------------------------------------------  Cardiovascular History & Procedures: Cardiovascular Problems:  Coronary artery disease status post NSTEMI and PCI  Risk Factors:  Known coronary artery disease, prior tobacco use, male gender, and age greater than 76  Cath/PCI:  LMCA normal. Large LAD with mild luminal irregularities. Large LCx with minimal luminal irregularities. Large, dominant RCA with 70% proximal and thrombotic 100% mid vessel  stenoses. 50-60% mid RCA disease followed by 70% distal stenoses. LVEF 60-65% with mild basal and mid inferior hypokinesis. LVEDP 11 mmHg. Successful PCI to mid RCA with placement of a Xience Alpine 2.75 x 33 mm drug-eluting stent.  CV Surgery:  None  EP Procedures and Devices:  None  Non-Invasive Evaluation(s):  TTE (08/08/13): Normal LV size. LVEF 55-60% with mild hypokinesis of the inferior myocardium. RV systolic function mildly to moderately reduced. No significant valvular abnormalities.  Recent CV Pertinent Labs: Lab Results  Component Value Date   CHOL 125 05/09/2016   CHOL 112 04/08/2016   HDL 37 (L) 05/09/2016   HDL 33 (L) 04/08/2016   LDLCALC 59 05/09/2016   LDLCALC 58 04/08/2016   TRIG 147 05/09/2016   CHOLHDL 3.4 05/09/2016   INR 1.79 (H) 08/08/2013   K 4.5 05/09/2016   MG 2.1 08/08/2013   BUN 22 05/09/2016   BUN 20 04/08/2016   CREATININE 1.24 05/09/2016    Past medical and surgical history were reviewed and updated in EPIC.  Current Meds  Medication Sig  . albuterol (PROVENTIL HFA;VENTOLIN HFA) 108 (90 Base) MCG/ACT inhaler Inhale 1-2 puffs into the lungs every 6 (six) hours as needed for wheezing or shortness of breath.  Marland Kitchen aspirin EC 81 MG tablet Take 1 tablet (81 mg total) by mouth daily.  Marland Kitchen emtricitabine-rilpivir-tenofovir AF (ODEFSEY) 200-25-25 MG TABS tablet Take 1 tablet by mouth daily with breakfast.  . fluticasone (FLONASE) 50 MCG/ACT nasal spray Place 2 sprays into both nostrils daily.  Marland Kitchen ibuprofen (ADVIL,MOTRIN) 800 MG tablet Take 1 tablet (800 mg total)  by mouth 3 (three) times daily.  . Multiple Vitamin (MULTI VITAMIN MENS PO) Take 1 tablet by mouth daily.  . nitroGLYCERIN (NITROSTAT) 0.4 MG SL tablet Place 1 tablet (0.4 mg total) under the tongue every 5 (five) minutes as needed for chest pain.  Marland Kitchen ondansetron (ZOFRAN ODT) 4 MG disintegrating tablet Take 1 tablet (4 mg total) by mouth every 8 (eight) hours as needed for nausea or vomiting.  .  simvastatin (ZOCOR) 20 MG tablet Take 1 tablet (20 mg total) by mouth daily at 6 PM.  . Spacer/Aero-Holding Chambers (AEROCHAMBER PLUS) inhaler Use as instructed    Allergies: Brilinta [ticagrelor]; Codeine; Bactrim [sulfamethoxazole-trimethoprim]; Ciprofloxacin; Dapsone; Pravastatin; and Sulfamethoxazole-trimethoprim  Social History   Social History  . Marital status: Single    Spouse name: N/A  . Number of children: N/A  . Years of education: N/A   Occupational History  . Not on file.   Social History Main Topics  . Smoking status: Former Smoker    Types: Cigarettes    Quit date: 03/02/2007  . Smokeless tobacco: Never Used     Comment: pt. no longer smokes  . Alcohol use Yes     Comment: weekends  . Drug use: No  . Sexual activity: No     Comment: pt. refused condoms   Other Topics Concern  . Not on file   Social History Narrative  . No narrative on file    Family History  Problem Relation Age of Onset  . Prostate cancer Father   . Osteosarcoma Father   . Prostate cancer Brother   . Prostate cancer Brother   . Cancer Brother   . Cancer Mother        brain tumor  . Rheum arthritis Mother     Review of Systems: A 12-system review of systems was performed and was negative except as noted in the HPI.  --------------------------------------------------------------------------------------------------  Physical Exam: BP 140/80 (BP Location: Left Arm, Patient Position: Sitting, Cuff Size: Normal)   Pulse 63   Ht 5\' 8"  (1.727 m)   Wt 192 lb 8 oz (87.3 kg)   BMI 29.27 kg/m   General:  Overweight man, seated comfortably in the exam room. HEENT: No conjunctival pallor or scleral icterus. Moist mucous membranes.  OP clear. Neck: Supple without lymphadenopathy, thyromegaly, JVD, or HJR. No carotid bruit. Lungs: Normal work of breathing. Clear to auscultation bilaterally without wheezes or crackles. Heart: Regular rate and rhythm without murmurs, rubs, or gallops.  Non-displaced PMI. Abd: Bowel sounds present. Soft, NT/ND without hepatosplenomegaly Ext: No lower extremity edema. Radial, PT, and DP pulses are 2+ bilaterally. Skin: Warm and dry without rash.  EKG:  Normal sinus rhythm without abnormalities.  Lab Results  Component Value Date   WBC 6.4 05/09/2016   HGB 15.1 05/09/2016   HCT 45.1 05/09/2016   MCV 93.2 05/09/2016   PLT 162 05/09/2016    Lab Results  Component Value Date   NA 142 05/09/2016   K 4.5 05/09/2016   CL 108 05/09/2016   CO2 24 05/09/2016   BUN 22 05/09/2016   CREATININE 1.24 05/09/2016   GLUCOSE 104 (H) 05/09/2016   ALT 23 05/09/2016    Lab Results  Component Value Date   CHOL 125 05/09/2016   HDL 37 (L) 05/09/2016   LDLCALC 59 05/09/2016   TRIG 147 05/09/2016   CHOLHDL 3.4 05/09/2016    --------------------------------------------------------------------------------------------------  ASSESSMENT AND PLAN: Coronary artery disease with atypical chest pain Albert Brown reports occasional brief  episodes of atypical chest pain that are nonexertional. We discussed continued observation and medical therapy versus stress testing for further characterization; we have agreed to defer stress testing pending further management of his hypertension. EKG today is normal.  Hypertension Blood pressure is elevated today. We discussed adding an agent to help better control his blood pressure. However, Albert Brown is hesitant to do this and would like to try lifestyle modifications first. We have provided him with information on the DASH diet as well as antihypertensive agents that we would consider adding if his blood pressure remains elevated (i.e chlorothiazide, losartan, amlodipine).  Dyslipidemia Lipid panel in March showed normal total cholesterol, LDL, and triglycerides but low HDL. I encouraged Albert Brown to exercise. We will continue his current dose of simvastatin, given that his LDL is less than 70.  Follow-up: Return  to clinic in 3 months.  Nelva Bush, MD 09/25/2016 2:44 PM

## 2016-09-25 NOTE — Patient Instructions (Signed)
Medication Instructions:  Your physician recommends that you continue on your current medications as directed. Please refer to the Current Medication list given to you today.   Labwork: none  Testing/Procedures: none  Follow-Up: Your physician recommends that you schedule a follow-up appointment in: 3 MONTHS WITH DR END.  Dr End encourages you to lose weight.   If you need a refill on your cardiac medications before your next appointment, please call your pharmacy.    Lisinopril tablets What is this medicine? LISINOPRIL (lyse IN oh pril) is an ACE inhibitor. This medicine is used to treat high blood pressure and heart failure. It is also used to protect the heart immediately after a heart attack. This medicine may be used for other purposes; ask your health care provider or pharmacist if you have questions. COMMON BRAND NAME(S): Prinivil, Zestril What should I tell my health care provider before I take this medicine? They need to know if you have any of these conditions: -diabetes -heart or blood vessel disease -kidney disease -low blood pressure -previous swelling of the tongue, face, or lips with difficulty breathing, difficulty swallowing, hoarseness, or tightening of the throat -an unusual or allergic reaction to lisinopril, other ACE inhibitors, insect venom, foods, dyes, or preservatives -pregnant or trying to get pregnant -breast-feeding How should I use this medicine? Take this medicine by mouth with a glass of water. Follow the directions on your prescription label. You may take this medicine with or without food. If it upsets your stomach, take it with food. Take your medicine at regular intervals. Do not take it more often than directed. Do not stop taking except on your doctor's advice. Talk to your pediatrician regarding the use of this medicine in children. Special care may be needed. While this drug may be prescribed for children as young as 24 years of age for  selected conditions, precautions do apply. Overdosage: If you think you have taken too much of this medicine contact a poison control center or emergency room at once. NOTE: This medicine is only for you. Do not share this medicine with others. What if I miss a dose? If you miss a dose, take it as soon as you can. If it is almost time for your next dose, take only that dose. Do not take double or extra doses. What may interact with this medicine? Do not take this medicine with any of the following medications: -hymenoptera venom -sacubitril; valsartan This medicines may also interact with the following medications: -aliskiren -angiotensin receptor blockers, like losartan or valsartan -certain medicines for diabetes -diuretics -everolimus -gold compounds -lithium -NSAIDs, medicines for pain and inflammation, like ibuprofen or naproxen -potassium salts or supplements -salt substitutes -sirolimus -temsirolimus This list may not describe all possible interactions. Give your health care provider a list of all the medicines, herbs, non-prescription drugs, or dietary supplements you use. Also tell them if you smoke, drink alcohol, or use illegal drugs. Some items may interact with your medicine. What should I watch for while using this medicine? Visit your doctor or health care professional for regular check ups. Check your blood pressure as directed. Ask your doctor what your blood pressure should be, and when you should contact him or her. Do not treat yourself for coughs, colds, or pain while you are using this medicine without asking your doctor or health care professional for advice. Some ingredients may increase your blood pressure. Women should inform their doctor if they wish to become pregnant or think they might be  pregnant. There is a potential for serious side effects to an unborn child. Talk to your health care professional or pharmacist for more information. Check with your doctor  or health care professional if you get an attack of severe diarrhea, nausea and vomiting, or if you sweat a lot. The loss of too much body fluid can make it dangerous for you to take this medicine. You may get drowsy or dizzy. Do not drive, use machinery, or do anything that needs mental alertness until you know how this drug affects you. Do not stand or sit up quickly, especially if you are an older patient. This reduces the risk of dizzy or fainting spells. Alcohol can make you more drowsy and dizzy. Avoid alcoholic drinks. Avoid salt substitutes unless you are told otherwise by your doctor or health care professional. What side effects may I notice from receiving this medicine? Side effects that you should report to your doctor or health care professional as soon as possible: -allergic reactions like skin rash, itching or hives, swelling of the hands, feet, face, lips, throat, or tongue -breathing problems -signs and symptoms of kidney injury like trouble passing urine or change in the amount of urine -signs and symptoms of increased potassium like muscle weakness; chest pain; or fast, irregular heartbeat -signs and symptoms of liver injury like dark yellow or brown urine; general ill feeling or flu-like symptoms; light-colored stools; loss of appetite; nausea; right upper belly pain; unusually weak or tired; yellowing of the eyes or skin -signs and symptoms of low blood pressure like dizziness; feeling faint or lightheaded, falls; unusually weak or tired -stomach pain with or without nausea and vomiting Side effects that usually do not require medical attention (report to your doctor or health care professional if they continue or are bothersome): -changes in taste -cough -dizziness -fever -headache -sensitivity to light This list may not describe all possible side effects. Call your doctor for medical advice about side effects. You may report side effects to FDA at 1-800-FDA-1088. Where  should I keep my medicine? Keep out of the reach of children. Store at room temperature between 15 and 30 degrees C (59 and 86 degrees F). Protect from moisture. Keep container tightly closed. Throw away any unused medicine after the expiration date. NOTE: This sheet is a summary. It may not cover all possible information. If you have questions about this medicine, talk to your doctor, pharmacist, or health care provider.  2018 Elsevier/Gold Standard (2015-04-03 12:52:35)     Hydrochlorothiazide, HCTZ capsules or tablets What is this medicine? HYDROCHLOROTHIAZIDE (hye droe klor oh THYE a zide) is a diuretic. It increases the amount of urine passed, which causes the body to lose salt and water. This medicine is used to treat high blood pressure. It is also reduces the swelling and water retention caused by various medical conditions, such as heart, liver, or kidney disease. This medicine may be used for other purposes; ask your health care provider or pharmacist if you have questions. COMMON BRAND NAME(S): Esidrix, Ezide, HydroDIURIL, Microzide, Oretic, Zide What should I tell my health care provider before I take this medicine? They need to know if you have any of these conditions: -diabetes -gout -immune system problems, like lupus -kidney disease or kidney stones -liver disease -pancreatitis -small amount of urine or difficulty passing urine -an unusual or allergic reaction to hydrochlorothiazide, sulfa drugs, other medicines, foods, dyes, or preservatives -pregnant or trying to get pregnant -breast-feeding How should I use this medicine? Take this  medicine by mouth with a glass of water. Follow the directions on the prescription label. Take your medicine at regular intervals. Remember that you will need to pass urine frequently after taking this medicine. Do not take your doses at a time of day that will cause you problems. Do not stop taking your medicine unless your doctor tells you  to. Talk to your pediatrician regarding the use of this medicine in children. Special care may be needed. Overdosage: If you think you have taken too much of this medicine contact a poison control center or emergency room at once. NOTE: This medicine is only for you. Do not share this medicine with others. What if I miss a dose? If you miss a dose, take it as soon as you can. If it is almost time for your next dose, take only that dose. Do not take double or extra doses. What may interact with this medicine? -cholestyramine -colestipol -digoxin -dofetilide -lithium -medicines for blood pressure -medicines for diabetes -medicines that relax muscles for surgery -other diuretics -steroid medicines like prednisone or cortisone This list may not describe all possible interactions. Give your health care provider a list of all the medicines, herbs, non-prescription drugs, or dietary supplements you use. Also tell them if you smoke, drink alcohol, or use illegal drugs. Some items may interact with your medicine. What should I watch for while using this medicine? Visit your doctor or health care professional for regular checks on your progress. Check your blood pressure as directed. Ask your doctor or health care professional what your blood pressure should be and when you should contact him or her. You may need to be on a special diet while taking this medicine. Ask your doctor. Check with your doctor or health care professional if you get an attack of severe diarrhea, nausea and vomiting, or if you sweat a lot. The loss of too much body fluid can make it dangerous for you to take this medicine. You may get drowsy or dizzy. Do not drive, use machinery, or do anything that needs mental alertness until you know how this medicine affects you. Do not stand or sit up quickly, especially if you are an older patient. This reduces the risk of dizzy or fainting spells. Alcohol may interfere with the effect of  this medicine. Avoid alcoholic drinks. This medicine may affect your blood sugar level. If you have diabetes, check with your doctor or health care professional before changing the dose of your diabetic medicine. This medicine can make you more sensitive to the sun. Keep out of the sun. If you cannot avoid being in the sun, wear protective clothing and use sunscreen. Do not use sun lamps or tanning beds/booths. What side effects may I notice from receiving this medicine? Side effects that you should report to your doctor or health care professional as soon as possible: -allergic reactions such as skin rash or itching, hives, swelling of the lips, mouth, tongue, or throat -changes in vision -chest pain -eye pain -fast or irregular heartbeat -feeling faint or lightheaded, falls -gout attack -muscle pain or cramps -pain or difficulty when passing urine -pain, tingling, numbness in the hands or feet -redness, blistering, peeling or loosening of the skin, including inside the mouth -unusually weak or tired Side effects that usually do not require medical attention (report to your doctor or health care professional if they continue or are bothersome): -change in sex drive or performance -dry mouth -headache -stomach upset This list may not describe  all possible side effects. Call your doctor for medical advice about side effects. You may report side effects to FDA at 1-800-FDA-1088. Where should I keep my medicine? Keep out of the reach of children. Store at room temperature between 15 and 30 degrees C (59 and 86 degrees F). Do not freeze. Protect from light and moisture. Keep container closed tightly. Throw away any unused medicine after the expiration date. NOTE: This sheet is a summary. It may not cover all possible information. If you have questions about this medicine, talk to your doctor, pharmacist, or health care provider.  2018 Elsevier/Gold Standard (2009-10-06  12:57:37)    Amlodipine tablets What is this medicine? AMLODIPINE (am LOE di peen) is a calcium-channel blocker. It affects the amount of calcium found in your heart and muscle cells. This relaxes your blood vessels, which can reduce the amount of work the heart has to do. This medicine is used to lower high blood pressure. It is also used to prevent chest pain. This medicine may be used for other purposes; ask your health care provider or pharmacist if you have questions. COMMON BRAND NAME(S): Norvasc What should I tell my health care provider before I take this medicine? They need to know if you have any of these conditions: -heart problems like heart failure or aortic stenosis -liver disease -an unusual or allergic reaction to amlodipine, other medicines, foods, dyes, or preservatives -pregnant or trying to get pregnant -breast-feeding How should I use this medicine? Take this medicine by mouth with a glass of water. Follow the directions on the prescription label. Take your medicine at regular intervals. Do not take more medicine than directed. Talk to your pediatrician regarding the use of this medicine in children. Special care may be needed. This medicine has been used in children as young as 6. Persons over 22 years old may have a stronger reaction to this medicine and need smaller doses. Overdosage: If you think you have taken too much of this medicine contact a poison control center or emergency room at once. NOTE: This medicine is only for you. Do not share this medicine with others. What if I miss a dose? If you miss a dose, take it as soon as you can. If it is almost time for your next dose, take only that dose. Do not take double or extra doses. What may interact with this medicine? -herbal or dietary supplements -local or general anesthetics -medicines for high blood pressure -medicines for prostate problems -rifampin This list may not describe all possible interactions.  Give your health care provider a list of all the medicines, herbs, non-prescription drugs, or dietary supplements you use. Also tell them if you smoke, drink alcohol, or use illegal drugs. Some items may interact with your medicine. What should I watch for while using this medicine? Visit your doctor or health care professional for regular check ups. Check your blood pressure and pulse rate regularly. Ask your health care professional what your blood pressure and pulse rate should be, and when you should contact him or her. This medicine may make you feel confused, dizzy or lightheaded. Do not drive, use machinery, or do anything that needs mental alertness until you know how this medicine affects you. To reduce the risk of dizzy or fainting spells, do not sit or stand up quickly, especially if you are an older patient. Avoid alcoholic drinks; they can make you more dizzy. Do not suddenly stop taking amlodipine. Ask your doctor or health care professional  how you can gradually reduce the dose. What side effects may I notice from receiving this medicine? Side effects that you should report to your doctor or health care professional as soon as possible: -allergic reactions like skin rash, itching or hives, swelling of the face, lips, or tongue -breathing problems -changes in vision or hearing -chest pain -fast, irregular heartbeat -swelling of legs or ankles Side effects that usually do not require medical attention (report to your doctor or health care professional if they continue or are bothersome): -dry mouth -facial flushing -nausea, vomiting -stomach gas, pain -tired, weak -trouble sleeping This list may not describe all possible side effects. Call your doctor for medical advice about side effects. You may report side effects to FDA at 1-800-FDA-1088. Where should I keep my medicine? Keep out of the reach of children. Store at room temperature between 59 and 86 degrees F (15 and 30 degrees  C). Protect from light. Keep container tightly closed. Throw away any unused medicine after the expiration date. NOTE: This sheet is a summary. It may not cover all possible information. If you have questions about this medicine, talk to your doctor, pharmacist, or health care provider.  2018 Elsevier/Gold Standard (2012-01-10 11:40:58)    DASH Eating Plan DASH stands for "Dietary Approaches to Stop Hypertension." The DASH eating plan is a healthy eating plan that has been shown to reduce high blood pressure (hypertension). It may also reduce your risk for type 2 diabetes, heart disease, and stroke. The DASH eating plan may also help with weight loss. What are tips for following this plan? General guidelines  Avoid eating more than 2,300 mg (milligrams) of salt (sodium) a day. If you have hypertension, you may need to reduce your sodium intake to 1,500 mg a day.  Limit alcohol intake to no more than 1 drink a day for nonpregnant women and 2 drinks a day for men. One drink equals 12 oz of beer, 5 oz of wine, or 1 oz of hard liquor.  Work with your health care provider to maintain a healthy body weight or to lose weight. Ask what an ideal weight is for you.  Get at least 30 minutes of exercise that causes your heart to beat faster (aerobic exercise) most days of the week. Activities may include walking, swimming, or biking.  Work with your health care provider or diet and nutrition specialist (dietitian) to adjust your eating plan to your individual calorie needs. Reading food labels  Check food labels for the amount of sodium per serving. Choose foods with less than 5 percent of the Daily Value of sodium. Generally, foods with less than 300 mg of sodium per serving fit into this eating plan.  To find whole grains, look for the word "whole" as the first word in the ingredient list. Shopping  Buy products labeled as "low-sodium" or "no salt added."  Buy fresh foods. Avoid canned foods and  premade or frozen meals. Cooking  Avoid adding salt when cooking. Use salt-free seasonings or herbs instead of table salt or sea salt. Check with your health care provider or pharmacist before using salt substitutes.  Do not fry foods. Cook foods using healthy methods such as baking, boiling, grilling, and broiling instead.  Cook with heart-healthy oils, such as olive, canola, soybean, or sunflower oil. Meal planning   Eat a balanced diet that includes: ? 5 or more servings of fruits and vegetables each day. At each meal, try to fill half of your plate with  fruits and vegetables. ? Up to 6-8 servings of whole grains each day. ? Less than 6 oz of lean meat, poultry, or fish each day. A 3-oz serving of meat is about the same size as a deck of cards. One egg equals 1 oz. ? 2 servings of low-fat dairy each day. ? A serving of nuts, seeds, or beans 5 times each week. ? Heart-healthy fats. Healthy fats called Omega-3 fatty acids are found in foods such as flaxseeds and coldwater fish, like sardines, salmon, and mackerel.  Limit how much you eat of the following: ? Canned or prepackaged foods. ? Food that is high in trans fat, such as fried foods. ? Food that is high in saturated fat, such as fatty meat. ? Sweets, desserts, sugary drinks, and other foods with added sugar. ? Full-fat dairy products.  Do not salt foods before eating.  Try to eat at least 2 vegetarian meals each week.  Eat more home-cooked food and less restaurant, buffet, and fast food.  When eating at a restaurant, ask that your food be prepared with less salt or no salt, if possible. What foods are recommended? The items listed may not be a complete list. Talk with your dietitian about what dietary choices are best for you. Grains Whole-grain or whole-wheat bread. Whole-grain or whole-wheat pasta. Brown rice. [place]. Bulgur. Whole-grain and low-sodium cereals. Pita bread. Low-fat, low-sodium crackers.  Whole-wheat flour tortillas. Vegetables Fresh or frozen vegetables (raw, steamed, roasted, or grilled). Low-sodium or reduced-sodium tomato and vegetable juice. Low-sodium or reduced-sodium tomato sauce and tomato paste. Low-sodium or reduced-sodium canned vegetables. Fruits All fresh, dried, or frozen fruit. Canned fruit in natural juice (without added sugar). Meat and other protein foods Skinless chicken or [place]. Ground chicken or [place]. Pork with fat trimmed off. Fish and seafood. Egg whites. Dried beans, peas, or lentils. Unsalted nuts, nut butters, and seeds. Unsalted canned beans. Lean cuts of beef with fat trimmed off. Low-sodium, lean deli meat. Dairy Low-fat (1%) or fat-free (skim) milk. Fat-free, low-fat, or reduced-fat cheeses. Nonfat, low-sodium ricotta or cottage cheese. Low-fat or nonfat yogurt. Low-fat, low-sodium cheese. Fats and oils Soft margarine without trans fats. Vegetable oil. Low-fat, reduced-fat, or light mayonnaise and salad dressings (reduced-sodium). Canola, safflower, olive, soybean, and sunflower oils. Avocado. Seasoning and other foods Herbs. Spices. Seasoning mixes without salt. Unsalted popcorn and pretzels. Fat-free sweets. What foods are not recommended? The items listed may not be a complete list. Talk with your dietitian about what dietary choices are best for you. Grains Baked goods made with fat, such as croissants, muffins, or some breads. Dry pasta or rice meal packs. Vegetables Creamed or fried vegetables. Vegetables in a cheese sauce. Regular canned vegetables (not low-sodium or reduced-sodium). Regular canned tomato sauce and paste (not low-sodium or reduced-sodium). Regular tomato and vegetable juice (not low-sodium or reduced-sodium). Angie Fava. Olives. Fruits Canned fruit in a light or heavy syrup. Fried fruit. Fruit in cream or butter sauce. Meat and other protein foods Fatty cuts of meat. Ribs. Fried meat. Berniece Salines. Sausage. Bologna and other  processed lunch meats. Salami. Fatback. Hotdogs. Bratwurst. Salted nuts and seeds. Canned beans with added salt. Canned or smoked fish. Whole eggs or egg yolks. Chicken or [place] with skin. Dairy Whole or 2% milk, cream, and half-and-half. Whole or full-fat cream cheese. Whole-fat or sweetened yogurt. Full-fat cheese. Nondairy creamers. Whipped toppings. Processed cheese and cheese spreads. Fats and oils Butter. Stick margarine. Lard. Shortening. Ghee. Bacon fat. Tropical oils, such as coconut, palm  kernel, or palm oil. Seasoning and other foods Salted popcorn and pretzels. Onion salt, garlic salt, seasoned salt, table salt, and sea salt. Worcestershire sauce. Tartar sauce. Barbecue sauce. Teriyaki sauce. Soy sauce, including reduced-sodium. Steak sauce. Canned and packaged gravies. Fish sauce. Oyster sauce. Cocktail sauce. Horseradish that you find on the shelf. Ketchup. Mustard. Meat flavorings and tenderizers. Bouillon cubes. Hot sauce and Tabasco sauce. Premade or packaged marinades. Premade or packaged taco seasonings. Relishes. Regular salad dressings. Where to find more information:  National Heart, Lung, and [place]: https://wilson-eaton.com/  American Heart Association: www.heart.org Summary  The DASH eating plan is a healthy eating plan that has been shown to reduce high blood pressure (hypertension). It may also reduce your risk for type 2 diabetes, heart disease, and stroke.  With the DASH eating plan, you should limit salt (sodium) intake to 2,300 mg a day. If you have hypertension, you may need to reduce your sodium intake to 1,500 mg a day.  When on the DASH eating plan, aim to eat more fresh fruits and vegetables, whole grains, lean proteins, low-fat dairy, and heart-healthy fats.  Work with your health care provider or diet and nutrition specialist (dietitian) to adjust your eating plan to your individual calorie needs. This information is not intended to replace advice given to  you by your health care provider. Make sure you discuss any questions you have with your health care provider. Document Released: 01/31/2011 Document Revised: 02/05/2016 Document Reviewed: 02/05/2016 Elsevier Interactive Patient Education  2017 [employer].

## 2016-09-27 DIAGNOSIS — I10 Essential (primary) hypertension: Secondary | ICD-10-CM | POA: Insufficient documentation

## 2016-10-23 ENCOUNTER — Ambulatory Visit: Payer: BLUE CROSS/BLUE SHIELD

## 2016-10-24 ENCOUNTER — Encounter: Payer: Self-pay | Admitting: Infectious Diseases

## 2016-12-11 ENCOUNTER — Encounter (INDEPENDENT_AMBULATORY_CARE_PROVIDER_SITE_OTHER): Payer: Self-pay

## 2016-12-30 NOTE — Progress Notes (Signed)
Follow-up Outpatient Visit Date: 12/31/2016  Primary Care Provider: Campbell Riches, MD [address] [place] [address]  Chief Complaint: High blood pressure  HPI:  Albert Brown is a 61 y.o. year-old male with history of coronary artery disease with an STEMI in 2015 status post drug-eluting stent to the mid RCA, HIV, and GERD, who presents for follow-up of coronary artery disease. I last saw him in August, at which time his only complaint was of chronic fatigue over the preceding year. His blood pressure was mildly elevated at that time. Albert Brown wished defer adding an antihypertensive agent in favor of lifestyle modifications.  Today, Albert Brown is somewhat disappointed by his lack of weight loss.  He has had a hard time adhering to a heart healthy diet due to his work.  Additionally, he is not been exercising regularly.  He notes occasional upper chest pains below both collarbones.  The pain is not related to any particular activity and typically lasts a few minutes at a time.  He wonders if it is related to musculoskeletal problems, as he does some lifting at work.  He also notes occasional shortness of breath.  He has mild dependent leg edema, unchanged.  He denies orthopnea, PND, palpitations, and lightheadedness.  He has been compliant with his medications.  He has not needed to use sublingual nitroglycerin.  --------------------------------------------------------------------------------------------------  Cardiovascular History & Procedures: Cardiovascular Problems:  Coronary artery disease status post NSTEMI and PCI  Risk Factors:  Known coronary artery disease, prior tobacco use, male gender, and age greater than 3  Cath/PCI:  LHC/PCI (08/08/13): LMCA normal. Large LAD with mild luminal irregularities. Large LCx with minimal luminal irregularities. Large, dominant RCA with 70% proximal and thrombotic 100% mid vessel stenoses. 50-60% mid RCA disease followed  by 70% distal stenoses. LVEF 60-65% with mild basal and mid inferior hypokinesis. LVEDP 11 mmHg. Successful PCI to mid RCA with placement of a Xience Alpine 2.75 x 33 mm drug-eluting stent.  CV Surgery:  None  EP Procedures and Devices:  None  Non-Invasive Evaluation(s):  TTE (08/08/13): Normal LV size. LVEF 55-60% with mild hypokinesis of the inferior myocardium. RV systolic function mildly to moderately reduced. No significant valvular abnormalities.  Recent CV Pertinent Labs: Lab Results  Component Value Date   CHOL 125 05/09/2016   CHOL 112 04/08/2016   HDL 37 (L) 05/09/2016   HDL 33 (L) 04/08/2016   LDLCALC 59 05/09/2016   LDLCALC 58 04/08/2016   TRIG 147 05/09/2016   CHOLHDL 3.4 05/09/2016   INR 1.79 (H) 08/08/2013   K 4.5 05/09/2016   MG 2.1 08/08/2013   BUN 22 05/09/2016   BUN 20 04/08/2016   CREATININE 1.24 05/09/2016    Past medical and surgical history were reviewed and updated in EPIC.  Current Meds  Medication Sig  . aspirin EC 81 MG tablet Take 1 tablet (81 mg total) by mouth daily.  Marland Kitchen emtricitabine-rilpivir-tenofovir AF (ODEFSEY) 200-25-25 MG TABS tablet Take 1 tablet by mouth daily with breakfast.  . fluticasone (FLONASE) 50 MCG/ACT nasal spray Place 2 sprays into both nostrils daily.  . Multiple Vitamin (MULTI VITAMIN MENS PO) Take 1 tablet by mouth daily.  . nitroGLYCERIN (NITROSTAT) 0.4 MG SL tablet Place 1 tablet (0.4 mg total) under the tongue every 5 (five) minutes as needed for chest pain.  Marland Kitchen ondansetron (ZOFRAN ODT) 4 MG disintegrating tablet Take 1 tablet (4 mg total) by mouth every 8 (eight) hours as needed for  nausea or vomiting.  . simvastatin (ZOCOR) 20 MG tablet Take 1 tablet (20 mg total) by mouth daily at 6 PM.    Allergies: Brilinta [ticagrelor]; Codeine; Bactrim [sulfamethoxazole-trimethoprim]; Ciprofloxacin; Dapsone; Pravastatin; and Sulfamethoxazole-trimethoprim  Social History   Socioeconomic History  . Marital status: Single      Spouse name: Not on file  . Number of children: Not on file  . Years of education: Not on file  . Highest education level: Not on file  Social Needs  . Financial resource strain: Not on file  . Food insecurity - worry: Not on file  . Food insecurity - inability: Not on file  . Transportation needs - medical: Not on file  . Transportation needs - non-medical: Not on file  Occupational History  . Not on file  Tobacco Use  . Smoking status: Former Smoker    Types: Cigarettes    Last attempt to quit: 03/02/2007    Years since quitting: 9.8  . Smokeless tobacco: Never Used  . Tobacco comment: pt. no longer smokes  Substance and Sexual Activity  . Alcohol use: Yes    Comment: weekends  . Drug use: No  . Sexual activity: No    Partners: Male    Comment: pt. refused condoms  Other Topics Concern  . Not on file  Social History Narrative  . Not on file    Family History  Problem Relation Age of Onset  . Prostate cancer Father   . Osteosarcoma Father   . Prostate cancer Brother   . Prostate cancer Brother   . Cancer Brother   . Cancer Mother        brain tumor  . Rheum arthritis Mother     Review of Systems: A 12-system review of systems was performed and was negative except as noted in the HPI.  --------------------------------------------------------------------------------------------------  Physical Exam: BP (!) 142/72 (BP Location: Left Arm, Patient Position: Sitting, Cuff Size: Normal)   Pulse 72   Ht 5' 8.5" (1.74 m)   Wt 86.6 kg (191 lb)   BMI 28.62 kg/m   General: Overweight man, seated comfortably in the exam room. HEENT: No conjunctival pallor or scleral icterus. Moist mucous membranes.  OP clear. Neck: Supple without lymphadenopathy, thyromegaly, JVD, or HJR. Lungs: Normal work of breathing. Clear to auscultation bilaterally without wheezes or crackles. Heart: Regular rate and rhythm without murmurs, rubs, or gallops. Non-displaced PMI. Abd: Bowel  sounds present. Soft, NT/ND without hepatosplenomegaly Ext: Trace pretibial edema bilaterally. Radial, PT, and DP pulses are 2+ bilaterally. Skin: Warm and dry without rash.  EKG: Normal sinus rhythm without abnormalities.  Lab Results  Component Value Date   WBC 6.4 05/09/2016   HGB 15.1 05/09/2016   HCT 45.1 05/09/2016   MCV 93.2 05/09/2016   PLT 162 05/09/2016    Lab Results  Component Value Date   NA 142 05/09/2016   K 4.5 05/09/2016   CL 108 05/09/2016   CO2 24 05/09/2016   BUN 22 05/09/2016   CREATININE 1.24 05/09/2016   GLUCOSE 104 (H) 05/09/2016   ALT 23 05/09/2016    Lab Results  Component Value Date   CHOL 125 05/09/2016   HDL 37 (L) 05/09/2016   LDLCALC 59 05/09/2016   TRIG 147 05/09/2016   CHOLHDL 3.4 05/09/2016    --------------------------------------------------------------------------------------------------  ASSESSMENT AND PLAN: Coronary artery disease with atypical angina Chest pain is atypical.  However, given the patient's history of CAD with PCI 2015 and multiple cardiac risk factors, we  have agreed to further evaluation with an exercise tolerance test.  His EKG today is normal.  We will not make any medication changes at this time.  I have encouraged Albert Brown to continue to try to lose weight.  Hypertension Blood pressure remains mildly elevated today.  We again discussed lifestyle modifications at length as well as pharmacologic therapy. Albert Brown remains resistant to adding a medication.  We have agreed to recheck his blood pressure and blood pressure response during the upcoming exercise tolerance test.  His blood pressure is poorly controlled at that time, we will start pharmacotherapy to treat his hypertension.  Hyperlipidemia Most recent LDL in March was 59 (goal less than 70).  Continue current dose of pravastatin.  Follow-up: Return to clinic in 3 months.  Nelva Bush, MD 12/31/2016 2:42 PM

## 2016-12-31 ENCOUNTER — Ambulatory Visit: Payer: BLUE CROSS/BLUE SHIELD | Admitting: Internal Medicine

## 2016-12-31 ENCOUNTER — Encounter: Payer: Self-pay | Admitting: Internal Medicine

## 2016-12-31 VITALS — BP 142/72 | HR 72 | Ht 68.5 in | Wt 191.0 lb

## 2016-12-31 DIAGNOSIS — E785 Hyperlipidemia, unspecified: Secondary | ICD-10-CM | POA: Diagnosis not present

## 2016-12-31 DIAGNOSIS — I25119 Atherosclerotic heart disease of native coronary artery with unspecified angina pectoris: Secondary | ICD-10-CM | POA: Diagnosis not present

## 2016-12-31 DIAGNOSIS — I10 Essential (primary) hypertension: Secondary | ICD-10-CM | POA: Diagnosis not present

## 2016-12-31 NOTE — Patient Instructions (Addendum)
Medication Instructions:  Your physician recommends that you continue on your current medications as directed. Please refer to the Current Medication list given to you today.   Labwork: none  Testing/Procedures: Your physician has requested that you have an exercise tolerance test. For further information please visit [email or web address]. Please also follow instruction sheet, as given. AT [facility] - Eat.   DO NOT drink or eat foods with caffeine for 24 hours before the test. (Chocolate, coffee, tea, decaf coffee/tea, or energy drinks)  DO NOT smoke for 4 hours before your test.  If you use an inhaler, bring it with you to the test.  Wear comfortable shoes and clothing. Women do not wear dresses.    Follow-Up: Your physician recommends that you schedule a follow-up appointment in: 3 MONTHS WITH DR END AT [facility].   If you need a refill on your cardiac medications before your next appointment, please call your pharmacy.

## 2017-01-01 ENCOUNTER — Encounter: Payer: Self-pay | Admitting: Internal Medicine

## 2017-01-07 ENCOUNTER — Telehealth: Payer: Self-pay | Admitting: Internal Medicine

## 2017-01-07 NOTE — Telephone Encounter (Signed)
Spoke with patient and he wanted [place] to add the Q[place] to his medication list. He takes a liquid form of this and does  Tablespoons a day. Entered into medication list and he was appreciative for the call back. He had no further questions or concerns at this time.

## 2017-01-07 NOTE — Telephone Encounter (Signed)
Pt states at his appt last week, he forgot to mention that he is taking Co Q 10. He wanted to make sure it was added to his medication list

## 2017-01-22 ENCOUNTER — Other Ambulatory Visit: Payer: Self-pay | Admitting: Infectious Diseases

## 2017-01-22 DIAGNOSIS — B20 Human immunodeficiency virus [HIV] disease: Secondary | ICD-10-CM

## 2017-02-26 ENCOUNTER — Ambulatory Visit (INDEPENDENT_AMBULATORY_CARE_PROVIDER_SITE_OTHER): Payer: BLUE CROSS/BLUE SHIELD

## 2017-02-26 DIAGNOSIS — I25119 Atherosclerotic heart disease of native coronary artery with unspecified angina pectoris: Secondary | ICD-10-CM

## 2017-02-26 DIAGNOSIS — I10 Essential (primary) hypertension: Secondary | ICD-10-CM

## 2017-02-26 LAB — EXERCISE TOLERANCE TEST
CHL CUP MPHR: 161 {beats}/min
CHL CUP RESTING HR STRESS: 78 {beats}/min
CHL RATE OF PERCEIVED EXERTION: 17
CSEPEDS: 0 s
CSEPHR: 93 %
Estimated workload: 8.5 METS
Exercise duration (min): 7 min
Peak HR: 150 {beats}/min

## 2017-03-12 ENCOUNTER — Ambulatory Visit: Payer: BLUE CROSS/BLUE SHIELD

## 2017-03-19 ENCOUNTER — Encounter: Payer: Self-pay | Admitting: Infectious Diseases

## 2017-04-03 ENCOUNTER — Ambulatory Visit: Payer: BLUE CROSS/BLUE SHIELD | Admitting: Internal Medicine

## 2017-04-10 NOTE — Addendum Note (Signed)
Addended by: Reggy Eye on: 04/10/2017 03:38 PM   Modules accepted: Orders

## 2017-04-16 ENCOUNTER — Other Ambulatory Visit (HOSPITAL_COMMUNITY)
Admission: RE | Admit: 2017-04-16 | Discharge: 2017-04-16 | Disposition: A | Discharge status: Home / Self Care | Payer: BLUE CROSS/BLUE SHIELD | Source: Ambulatory Visit | Attending: Infectious Diseases | Admitting: Infectious Diseases

## 2017-04-16 ENCOUNTER — Other Ambulatory Visit: Payer: Self-pay | Admitting: Infectious Diseases

## 2017-04-16 ENCOUNTER — Other Ambulatory Visit: Payer: BLUE CROSS/BLUE SHIELD

## 2017-04-16 DIAGNOSIS — B20 Human immunodeficiency virus [HIV] disease: Secondary | ICD-10-CM

## 2017-04-16 DIAGNOSIS — Z113 Encounter for screening for infections with a predominantly sexual mode of transmission: Secondary | ICD-10-CM | POA: Insufficient documentation

## 2017-04-16 DIAGNOSIS — F329 Major depressive disorder, single episode, unspecified: Secondary | ICD-10-CM

## 2017-04-16 DIAGNOSIS — F32A Depression, unspecified: Secondary | ICD-10-CM

## 2017-04-16 DIAGNOSIS — Z79899 Other long term (current) drug therapy: Secondary | ICD-10-CM

## 2017-04-16 NOTE — Progress Notes (Signed)
Wants testosterone checked

## 2017-04-17 LAB — LIPID PANEL
CHOL/HDL RATIO: 2.9 (calc) (ref <5.0)
CHOLESTEROL: 138 mg/dL (ref <200)
HDL: 48 mg/dL (ref >40)
LDL CHOLESTEROL (CALC): 67 mg/dL
Non-HDL Cholesterol (Calc): 90 mg/dL (calc) (ref <130)
Triglycerides: 147 mg/dL (ref <150)

## 2017-04-17 LAB — CBC
HCT: 44.3 % (ref 38.5–50.0)
Hemoglobin: 15.7 g/dL (ref 13.2–17.1)
MCH: 31.5 pg (ref 27.0–33.0)
MCHC: 35.4 g/dL (ref 32.0–36.0)
MCV: 89 fL (ref 80.0–100.0)
MPV: 11.1 fL (ref 7.5–12.5)
PLATELETS: 187 10*3/uL (ref 140–400)
RBC: 4.98 10*6/uL (ref 4.20–5.80)
RDW: 12.2 % (ref 11.0–15.0)
WBC: 6.7 10*3/uL (ref 3.8–10.8)

## 2017-04-17 LAB — COMPREHENSIVE METABOLIC PANEL
AG Ratio: 1.7 (calc) (ref 1.0–2.5)
ALBUMIN MSPROF: 4.6 g/dL (ref 3.6–5.1)
ALKALINE PHOSPHATASE (APISO): 86 U/L (ref 40–115)
ALT: 27 U/L (ref 9–46)
AST: 23 U/L (ref 10–35)
BUN: 18 mg/dL (ref 7–25)
CO2: 24 mmol/L (ref 20–32)
CREATININE: 1.19 mg/dL (ref 0.70–1.33)
Calcium: 9.8 mg/dL (ref 8.6–10.3)
Chloride: 106 mmol/L (ref 98–110)
GLUCOSE: 87 mg/dL (ref 65–99)
Globulin: 2.7 g/dL (calc) (ref 1.9–3.7)
POTASSIUM: 4.3 mmol/L (ref 3.5–5.3)
SODIUM: 139 mmol/L (ref 135–146)
TOTAL PROTEIN: 7.3 g/dL (ref 6.1–8.1)
Total Bilirubin: 0.5 mg/dL (ref 0.2–1.2)

## 2017-04-17 LAB — URINE CYTOLOGY ANCILLARY ONLY
CHLAMYDIA, DNA PROBE: NEGATIVE
NEISSERIA GONORRHEA: NEGATIVE

## 2017-04-17 LAB — TESTOSTERONE: Testosterone: 262 ng/dL (ref 250–827)

## 2017-04-17 LAB — T-HELPER CELL (CD4) - (RCID CLINIC ONLY)
CD4 T CELL HELPER: 32 % — AB (ref 33–55)
CD4 T Cell Abs: 630 /uL (ref 400–2700)

## 2017-04-17 LAB — RPR: RPR Ser Ql: NONREACTIVE

## 2017-04-18 ENCOUNTER — Ambulatory Visit: Payer: BLUE CROSS/BLUE SHIELD | Admitting: Internal Medicine

## 2017-04-18 LAB — HIV-1 RNA QUANT-NO REFLEX-BLD
HIV 1 RNA QUANT: NOT DETECTED {copies}/mL
HIV-1 RNA QUANT, LOG: NOT DETECTED {Log_copies}/mL

## 2017-04-30 ENCOUNTER — Encounter: Payer: Self-pay | Admitting: Infectious Diseases

## 2017-04-30 ENCOUNTER — Ambulatory Visit (INDEPENDENT_AMBULATORY_CARE_PROVIDER_SITE_OTHER): Payer: BLUE CROSS/BLUE SHIELD | Admitting: Infectious Diseases

## 2017-04-30 VITALS — BP 171/93 | HR 80 | Temp 98.1°F | Ht 68.0 in | Wt 194.0 lb

## 2017-04-30 DIAGNOSIS — B20 Human immunodeficiency virus [HIV] disease: Secondary | ICD-10-CM | POA: Diagnosis not present

## 2017-04-30 DIAGNOSIS — I10 Essential (primary) hypertension: Secondary | ICD-10-CM | POA: Diagnosis not present

## 2017-04-30 DIAGNOSIS — F329 Major depressive disorder, single episode, unspecified: Secondary | ICD-10-CM

## 2017-04-30 DIAGNOSIS — E785 Hyperlipidemia, unspecified: Secondary | ICD-10-CM | POA: Diagnosis not present

## 2017-04-30 DIAGNOSIS — F32A Depression, unspecified: Secondary | ICD-10-CM

## 2017-04-30 DIAGNOSIS — F411 Generalized anxiety disorder: Secondary | ICD-10-CM | POA: Diagnosis not present

## 2017-04-30 DIAGNOSIS — I2510 Atherosclerotic heart disease of native coronary artery without angina pectoris: Secondary | ICD-10-CM | POA: Diagnosis not present

## 2017-04-30 DIAGNOSIS — Z9861 Coronary angioplasty status: Secondary | ICD-10-CM

## 2017-04-30 DIAGNOSIS — G4700 Insomnia, unspecified: Secondary | ICD-10-CM | POA: Diagnosis not present

## 2017-04-30 MED ORDER — TRAZODONE HCL 50 MG PO TABS: 50.0000 mg | ORAL_TABLET | Freq: Every evening | ORAL | 3 refills | Status: DC | PRN

## 2017-04-30 NOTE — Assessment & Plan Note (Signed)
Will get him in with Marcie Bal

## 2017-04-30 NOTE — Assessment & Plan Note (Signed)
He has CV f/u later this month I encouraged him to work on diet.

## 2017-04-30 NOTE — Progress Notes (Signed)
   Subjective:    Patient ID: ELIAZ FOUT, male    DOB: [birth year], 62 y.o.   MRN: [id number]  HPI 62yo M with HIV+ (on triumeq --> complera--> odefsy)  He presented to [facility] on 08/07/2013 and was found to have acute total occlusion of the mid RCA. This was successfully treated with PCI plus a drug-eluting stent. He had well-preserved left ventricular systolic function (EF 84-69%). There was mild basilar to mid inferior hypokinesis.   Hsa been fatigued lately, low energy. Has AM nausea.   Depression, anxiety has worsened. Has lost interest in exercise.  Has CV f/u this month.   HIV 1 RNA Quant (copies/mL)  Date Value  04/16/2017 <20 NOT DETECTED  05/09/2016 <20 DETECTED (A)  11/30/2015 <20   CD4 T Cell Abs (/uL)  Date Value  04/16/2017 630  05/09/2016 600  11/30/2015 530    Review of Systems  Constitutional: Positive for fatigue and unexpected weight change. Negative for appetite change.  Respiratory: Negative for cough and shortness of breath.   Cardiovascular: Positive for chest pain.  Gastrointestinal: Positive for nausea. Negative for vomiting.  Genitourinary: Negative for difficulty urinating.  Neurological: Positive for headaches.  Psychiatric/Behavioral: Positive for dysphoric mood and sleep disturbance. Negative for suicidal ideas. The patient is nervous/anxious.   has been reluctant to go on anti-htn rx.  Pain in L upper chest- occas numbness of overlying skin. Itches. Feels like it is under his skin. No change with exercise, exertion.  Wants to go back on xanax.  Takes muscle relaxers to help sleep. Prev had restoril, didn't feel like it helped. Am awakening. Tried trazadone 50mg  prev, didn't think the dose was high enough.  Please see HPI. All other systems reviewed and negative.     Objective:   Physical Exam  Constitutional: He appears well-developed and well-nourished.  HENT:  Mouth/Throat: No oropharyngeal exudate.  Eyes: EOM are normal.  Pupils are equal, round, and reactive to light.  Neck: Neck supple.  Cardiovascular: Normal rate, regular rhythm and normal heart sounds.  Pulmonary/Chest: Effort normal and breath sounds normal.  Abdominal: Soft. Bowel sounds are normal. There is no tenderness. There is no rebound.  Musculoskeletal: He exhibits no edema.  Lymphadenopathy:    He has no cervical adenopathy.  Psychiatric: His mood appears anxious.       Assessment & Plan:

## 2017-04-30 NOTE — Assessment & Plan Note (Signed)
He is doing well Refuses condoms Refuses flu shot.  Will see him back in 6 months Will try to get him in with IMTS

## 2017-04-30 NOTE — Assessment & Plan Note (Signed)
Will refill trazadone Increase dose after first week if not working, he will call.

## 2017-04-30 NOTE — Assessment & Plan Note (Signed)
Continues on statin.  Will f/u with CV.  lfts normal  Hepatic Function Latest Ref Rng & Units 04/16/2017 05/09/2016 04/08/2016  Total Protein 6.1 - 8.1 g/dL 7.3 7.0 7.2  Albumin 3.6 - 5.1 g/dL - 4.2 4.4  AST 10 - 35 U/L _0 ALT 9 - 46 U/L _1 Alk Phosphatase 40 - 115 U/L - 84 107  Total Bilirubin 0.2 - 1.2 mg/dL 0.5 0.4 0.5

## 2017-04-30 NOTE — Assessment & Plan Note (Signed)
Bp repeated 150/105 Will f/u with CV I asked him to consider that his fatigue could be related to uncontrolled bp.

## 2017-04-30 NOTE — Assessment & Plan Note (Addendum)
>>  ASSESSMENT AND PLAN FOR ANXIETY AND DEPRESSION WRITTEN ON 04/30/2017 10:53 AM BY Makar Slatter C, MD  Will get him in with Leary Provencal No SI   >>ASSESSMENT AND PLAN FOR INSOMNIA WRITTEN ON 04/30/2017 10:54 AM BY Khyleigh Furney C, MD  Will refill trazadone Increase dose after first week if not working, he will call.

## 2017-05-19 ENCOUNTER — Ambulatory Visit: Payer: BLUE CROSS/BLUE SHIELD | Admitting: Internal Medicine

## 2017-05-22 ENCOUNTER — Ambulatory Visit: Payer: BLUE CROSS/BLUE SHIELD

## 2017-05-29 ENCOUNTER — Ambulatory Visit: Payer: BLUE CROSS/BLUE SHIELD

## 2017-07-12 ENCOUNTER — Other Ambulatory Visit: Payer: Self-pay | Admitting: Internal Medicine

## 2017-07-12 DIAGNOSIS — I2510 Atherosclerotic heart disease of native coronary artery without angina pectoris: Secondary | ICD-10-CM

## 2017-07-12 DIAGNOSIS — E785 Hyperlipidemia, unspecified: Secondary | ICD-10-CM

## 2017-07-12 DIAGNOSIS — Z9861 Coronary angioplasty status: Principal | ICD-10-CM

## 2017-07-16 ENCOUNTER — Telehealth: Payer: Self-pay | Admitting: Internal Medicine

## 2017-07-16 ENCOUNTER — Other Ambulatory Visit: Payer: Self-pay | Admitting: *Deleted

## 2017-07-16 DIAGNOSIS — Z9861 Coronary angioplasty status: Principal | ICD-10-CM

## 2017-07-16 DIAGNOSIS — I2510 Atherosclerotic heart disease of native coronary artery without angina pectoris: Secondary | ICD-10-CM

## 2017-07-16 DIAGNOSIS — E785 Hyperlipidemia, unspecified: Secondary | ICD-10-CM

## 2017-07-16 MED ORDER — SIMVASTATIN 20 MG PO TABS: 20.0000 mg | ORAL_TABLET | Freq: Every day | ORAL | 0 refills | Status: DC

## 2017-07-16 NOTE — Telephone Encounter (Signed)
Pt is scheduled for 07/31/17 with Dr Saunders Revel

## 2017-07-16 NOTE — Telephone Encounter (Signed)
-----   Message from Anselm Pancoast, [place] sent at 07/14/2017  9:32 AM EDT ----- Hello ladies!  This patient is past due for a follow up with Dr. Saunders Revel.  He is requesting refills. Can you please contact the patient to see if he is going somewhere else for heart care?  Thanks, Ivin Booty

## 2017-07-16 NOTE — Telephone Encounter (Signed)
Requested Prescriptions   Signed Prescriptions Disp Refills  . simvastatin (ZOCOR) 20 MG tablet 30 tablet 0    Sig: Take 1 tablet (20 mg total) by mouth daily at 6 PM.    Authorizing Provider: END, [place]    Ordering User: Britt Bottom

## 2017-07-16 NOTE — Telephone Encounter (Signed)
Requested Prescriptions   Signed Prescriptions Disp Refills  . simvastatin (ZOCOR) 20 MG tablet 30 tablet 0    Sig: Take 1 tablet (20 mg total) by mouth daily at 6 PM.    Authorizing Provider: END, [place]    Ordering User: Britt Bottom

## 2017-07-31 ENCOUNTER — Ambulatory Visit: Payer: BLUE CROSS/BLUE SHIELD | Admitting: Internal Medicine

## 2017-07-31 ENCOUNTER — Encounter: Payer: Self-pay | Admitting: Internal Medicine

## 2017-07-31 VITALS — BP 130/90 | HR 77 | Ht 68.5 in | Wt 183.6 lb

## 2017-07-31 DIAGNOSIS — I10 Essential (primary) hypertension: Secondary | ICD-10-CM

## 2017-07-31 DIAGNOSIS — I2510 Atherosclerotic heart disease of native coronary artery without angina pectoris: Secondary | ICD-10-CM | POA: Diagnosis not present

## 2017-07-31 DIAGNOSIS — E785 Hyperlipidemia, unspecified: Secondary | ICD-10-CM | POA: Diagnosis not present

## 2017-07-31 DIAGNOSIS — R0600 Dyspnea, unspecified: Secondary | ICD-10-CM

## 2017-07-31 DIAGNOSIS — R0609 Other forms of dyspnea: Secondary | ICD-10-CM

## 2017-07-31 MED ORDER — AMLODIPINE BESYLATE 2.5 MG PO TABS: 2.5000 mg | ORAL_TABLET | Freq: Every day | ORAL | 3 refills | Status: DC

## 2017-07-31 NOTE — Patient Instructions (Addendum)
Medication Instructions:  START Amlodipine (Norvasc) 2.5 mg daily   -- If you need a refill on your cardiac medications before your next appointment, please call your pharmacy. --  Labwork: None ordered  Testing/Procedures: None ordered  Follow-Up: Your physician wants you to follow-up in: 3 months with Dr. Saunders Revel.     Thank you for choosing [facility]!!    Any Other Special Instructions Will Be Listed Below (If Applicable).  Check with pharmacist about a hydrocortisone ointment for hemorrhoids.Marland Kitchen

## 2017-07-31 NOTE — Progress Notes (Signed)
Follow-up Outpatient Visit Date: 07/31/2017  Primary Care Provider: Campbell Riches, MD [address] [place] [address]  Chief Complaint: Follow-up coronary artery disease and hypertension  HPI:  Mr. Strader is a 62 y.o. year-old male with history of CAD with inferior STEMI (2015) s/p DES to the mid RCA, hypertension, hyperlipidemia, HIV, and GERD, who presents for follow-up of CAD and hypertension.  I last saw Mr. Loraine Leriche in November, at which time he complained of atypical chest pain as well as occasional shortness of breath.  Subsequent ETT was low risk without ischemic changes, though hypertensive blood pressure response was noted.  Today, Mr. Loraine Leriche happily reports that he has lost ~10 pounds through lifestyle modifications (mostly portion control).  He continues to have sporadic episodes of upper chest pain below the clavicles, which is not exertional.  He reports some exertional dyspnea/fatigue, though this has improved somewhat with weight loss.  He notes dependent edema, which he attributes to being on his feet all day.  He denies orthopnea, PND, palpitations, and lightheadedness.  --------------------------------------------------------------------------------------------------  Cardiovascular History & Procedures: Cardiovascular Problems:  Coronary artery disease status post NSTEMI and PCI  Risk Factors:  Known coronary artery disease, prior tobacco use, male gender, and age greater than 77  Cath/PCI:  LHC/PCI (08/08/13): LMCA normal. Large LAD with mild luminal irregularities. Large LCx with minimal luminal irregularities. Large, dominant RCA with 70% proximal and thrombotic 100% mid vessel stenoses. 50-60% mid RCA disease followed by 70% distal stenoses. LVEF 60-65% with mild basal and mid inferior hypokinesis. LVEDP 11 mmHg. Successful PCI to mid RCA with placement of a Xience Alpine 2.75 x 33 mm drug-eluting stent.  CV Surgery:  None  EP Procedures  and Devices:  None  Non-Invasive Evaluation(s):  ETT (02/26/17): Normal exercise capacity without ischemia.  Hypertensive BP response.  Patient exercise 7:00 minutes, achieving 150 bpm (93% MPHR) with max BP of 222/94  TTE (08/08/13): Normal LV size. LVEF 55-60% with mild hypokinesis of the inferior myocardium. RV systolic function mildly to moderately reduced. No significant valvular abnormalities.   Recent CV Pertinent Labs: Lab Results  Component Value Date   CHOL 138 04/16/2017   CHOL 112 04/08/2016   HDL 48 04/16/2017   HDL 33 (L) 04/08/2016   LDLCALC 67 04/16/2017   TRIG 147 04/16/2017   CHOLHDL 2.9 04/16/2017   INR 1.79 (H) 08/08/2013   K 4.3 04/16/2017   MG 2.1 08/08/2013   BUN 18 04/16/2017   BUN 20 04/08/2016   CREATININE 1.19 04/16/2017    Past medical and surgical history were reviewed and updated in EPIC.  Current Meds  Medication Sig  . aspirin EC 81 MG tablet Take 1 tablet (81 mg total) by mouth daily.  . fluticasone (FLONASE) 50 MCG/ACT nasal spray Place 2 sprays into both nostrils daily.  . Multiple Vitamin (MULTI VITAMIN MENS PO) Take 1 tablet by mouth daily.  . nitroGLYCERIN (NITROSTAT) 0.4 MG SL tablet Place 1 tablet (0.4 mg total) under the tongue every 5 (five) minutes as needed for chest pain.  . ODEFSEY 200-25-25 MG TABS tablet TAKE 1 TABLET BY MOUTH DAILY WITH BREAKFAST.  Marland Kitchen simvastatin (ZOCOR) 20 MG tablet Take 1 tablet (20 mg total) by mouth daily at 6 PM.  . traZODone (DESYREL) 50 MG tablet Take 1 tablet (50 mg total) by mouth at bedtime as needed for sleep.    Allergies: Brilinta [ticagrelor]; Codeine; Bactrim [sulfamethoxazole-trimethoprim]; Ciprofloxacin; Dapsone; Pravastatin; and Sulfamethoxazole-trimethoprim  Social History  Tobacco Use  . Smoking status: Former Smoker    Types: Cigarettes    Last attempt to quit: 03/02/2007    Years since quitting: 10.4  . Smokeless tobacco: Never Used  . Tobacco comment: pt. no longer smokes    Substance Use Topics  . Alcohol use: Yes    Comment: weekends  . Drug use: No    Family History  Problem Relation Age of Onset  . Prostate cancer Father   . Osteosarcoma Father   . Prostate cancer Brother   . Prostate cancer Brother   . Cancer Brother   . Cancer Mother        brain tumor  . Rheum arthritis Mother     Review of Systems: A 12-system review of systems was performed and was negative except as noted in the HPI.  --------------------------------------------------------------------------------------------------  Physical Exam: BP 130/90   Pulse 77   Ht 5' 8.5" (1.74 m)   Wt 183 lb 9.6 oz (83.3 kg)   SpO2 98%   BMI 27.51 kg/m   General:  NAD HEENT: No conjunctival pallor or scleral icterus. Moist mucous membranes.  OP clear. Neck: Supple without lymphadenopathy, thyromegaly, JVD, or HJR. Lungs: Normal work of breathing. Clear to auscultation bilaterally without wheezes or crackles. Heart: Regular rate and rhythm without murmurs, rubs, or gallops. Non-displaced PMI. Abd: Bowel sounds present. Soft, NT/ND without hepatosplenomegaly Ext: Trace ankle edema. Radial, PT, and DP pulses are 2+ bilaterally. Skin: Warm and dry without rash.  Lab Results  Component Value Date   WBC 6.7 04/16/2017   HGB 15.7 04/16/2017   HCT 44.3 04/16/2017   MCV 89.0 04/16/2017   PLT 187 04/16/2017    Lab Results  Component Value Date   NA 139 04/16/2017   K 4.3 04/16/2017   CL 106 04/16/2017   CO2 24 04/16/2017   BUN 18 04/16/2017   CREATININE 1.19 04/16/2017   GLUCOSE 87 04/16/2017   ALT 27 04/16/2017    Lab Results  Component Value Date   CHOL 138 04/16/2017   HDL 48 04/16/2017   LDLCALC 67 04/16/2017   TRIG 147 04/16/2017   CHOLHDL 2.9 04/16/2017    --------------------------------------------------------------------------------------------------  ASSESSMENT AND PLAN: Coronary artery disease without angina Overall, Mr. Loraine Leriche is doing well.  He continues  to have sporadic episodes of atypical chest pain.  Given normal ETT, I doubt that this represents worsening coronary insufficiency.  We will continue his current regiment for secondary prevention consisting of ASA and simvastatin (intolerant of other statins).  Hypertension Blood pressure mildly elevated today.  Hypertensive BP response also noted on recent ETT, which may be contributing to some of Mr. Collum's exertional fatigue.  We have discussed medical treatment options and have agreed to start amlodipine 2.5 mg daily, as there do not appear to be any potential interactions with his antiretroviral regimen.  Dyspnea on exertion Likely multifactorial and improved with weight loss since last visit.  I have encouraged continued weight loss as well as improved BP control, which could be contributing.  We will add amlodipine 2.5 mg daily.  Hyperlipidemia Most recent LDL at goal.  Continue simvastatin, given intolerance to other statins in the past.  Follow-up: Return to clinic in 3 months.  Nelva Bush, MD 07/31/2017 4:40 PM

## 2017-08-01 ENCOUNTER — Encounter: Payer: Self-pay | Admitting: Internal Medicine

## 2017-08-01 DIAGNOSIS — R0609 Other forms of dyspnea: Secondary | ICD-10-CM | POA: Insufficient documentation

## 2017-08-01 DIAGNOSIS — I25119 Atherosclerotic heart disease of native coronary artery with unspecified angina pectoris: Secondary | ICD-10-CM | POA: Insufficient documentation

## 2017-08-01 DIAGNOSIS — R0600 Dyspnea, unspecified: Secondary | ICD-10-CM | POA: Insufficient documentation

## 2017-08-01 DIAGNOSIS — I2510 Atherosclerotic heart disease of native coronary artery without angina pectoris: Secondary | ICD-10-CM | POA: Insufficient documentation

## 2017-08-25 ENCOUNTER — Other Ambulatory Visit: Payer: Self-pay | Admitting: Infectious Diseases

## 2017-08-25 DIAGNOSIS — B20 Human immunodeficiency virus [HIV] disease: Secondary | ICD-10-CM

## 2017-09-03 ENCOUNTER — Ambulatory Visit: Payer: BLUE CROSS/BLUE SHIELD

## 2017-09-10 ENCOUNTER — Encounter: Payer: Self-pay | Admitting: Infectious Diseases

## 2017-10-13 ENCOUNTER — Ambulatory Visit (INDEPENDENT_AMBULATORY_CARE_PROVIDER_SITE_OTHER): Payer: BLUE CROSS/BLUE SHIELD | Admitting: Licensed Clinical Social Worker

## 2017-10-13 DIAGNOSIS — F331 Major depressive disorder, recurrent, moderate: Secondary | ICD-10-CM | POA: Diagnosis not present

## 2017-10-14 NOTE — BH Specialist Note (Signed)
Integrated Behavioral Health Initial Visit  MRN: [id number] Name: Albert Brown  Number of [place] Clinician visits:: 1/6 Session Start time: 3:00pm  Session End time: 3:48pm Total time: 45 minutes  Type of Service: [place] Interpretor:No. Interpretor Name and Language: n/a  SUBJECTIVE: Albert Brown is a 62 y.o. male accompanied by self Patient self-referred for depression. Patient reports the following symptoms/concerns: depressed mood, uncharacteristic anger at small things, no desire or enjoyment in activities, lack of energy and motivation, desire to be alone, tired all the time, feeling worthless Duration of problem: 1 year; Severity of problem: moderate  OBJECTIVE: Mood: Depressed and Affect: Blunt Risk of harm to self or others: No plan to harm self or others  LIFE CONTEXT: Patient lives alone and works for [employer]. He loves his job, but reports that outside of work he has little energy, motivation or interest in the things he used to enjoy. Patient describes being very closed off from people and not trusting much of anyone, but states he used to be very social and enjoy having friends over as well as going out with others. He states that he was in an unhealthy relationship, but that ended 4 or 5 years ago, and he has not had much support since then. Patient used to enjoy painting, riding his bike, traveling, and hanging out with friends but now does not do or desire to do any of those things. He states that he has one brother in town, but does not see as much of him anymore and does not believe that this brother or his other siblings know him very well or care about how he is. Patient also states that people at work would be shocked to know he suffers from depression, as he is "on" at work all the time and is very pleasant.   GOALS ADDRESSED: Patient will: 1. Reduce symptoms of:  depression   INTERVENTIONS: Interventions utilized: Motivational Interviewing, Brief CBT and Supportive Counseling   ASSESSMENT: Patient currently experiencing depressed mood, irritability and mood swings, anhedonia, lack of energy and motivation, isolation, fatigue, feelings of  Worthlessness. His symptoms are most consistent with a diagnosis of Major Depressive Disorder, Recurrent and Moderate in severity. Counselor guided patient in exploring his symptoms. Patient is most bothered by the anger he is experiencing. Counselor and patient discussed the connection between anger and depression. Counselor guided patient in functional analysis of recent anger episodes. Patient was able to draw connections between the anger he experienced and other symptoms of depression, including feelings of worthlessness/low self-confidence, desire to isolate and fatigue/loss of energy. Counselor and patient discussed the ways that thoughts, feelings, and actions interact and impact one another. Counselor guided patient in identifying some harmful thought processes about self and others, and patient engaged in brainstorming replacement thoughts. Counselor encouraged patient to also try working up to engaging in activities he used to enjoy, even if only for short periods of time.    Patient may benefit from ongoing CBT to address depression.  PLAN: 1. Follow up with behavioral health clinician on : 10/29/17   Lillie Fragmin, LCSW

## 2017-10-20 ENCOUNTER — Other Ambulatory Visit: Payer: BLUE CROSS/BLUE SHIELD

## 2017-10-21 ENCOUNTER — Other Ambulatory Visit: Payer: BLUE CROSS/BLUE SHIELD

## 2017-10-28 ENCOUNTER — Other Ambulatory Visit: Payer: BLUE CROSS/BLUE SHIELD

## 2017-10-28 DIAGNOSIS — B20 Human immunodeficiency virus [HIV] disease: Secondary | ICD-10-CM

## 2017-10-29 ENCOUNTER — Institutional Professional Consult (permissible substitution): Payer: BLUE CROSS/BLUE SHIELD | Admitting: Licensed Clinical Social Worker

## 2017-10-29 LAB — T-HELPER CELL (CD4) - (RCID CLINIC ONLY)
CD4 T CELL HELPER: 28 % — AB (ref 33–55)
CD4 T Cell Abs: 430 /uL (ref 400–2700)

## 2017-10-30 LAB — CBC
HCT: 44.6 % (ref 38.5–50.0)
Hemoglobin: 15.1 g/dL (ref 13.2–17.1)
MCH: 31.4 pg (ref 27.0–33.0)
MCHC: 33.9 g/dL (ref 32.0–36.0)
MCV: 92.7 fL (ref 80.0–100.0)
MPV: 11.2 fL (ref 7.5–12.5)
PLATELETS: 177 10*3/uL (ref 140–400)
RBC: 4.81 10*6/uL (ref 4.20–5.80)
RDW: 12.1 % (ref 11.0–15.0)
WBC: 6.7 10*3/uL (ref 3.8–10.8)

## 2017-10-30 LAB — COMPREHENSIVE METABOLIC PANEL
AG RATIO: 1.9 (calc) (ref 1.0–2.5)
ALBUMIN MSPROF: 4.4 g/dL (ref 3.6–5.1)
ALKALINE PHOSPHATASE (APISO): 81 U/L (ref 40–115)
ALT: 25 U/L (ref 9–46)
AST: 20 U/L (ref 10–35)
BILIRUBIN TOTAL: 0.4 mg/dL (ref 0.2–1.2)
BUN: 22 mg/dL (ref 7–25)
CALCIUM: 9.5 mg/dL (ref 8.6–10.3)
CHLORIDE: 105 mmol/L (ref 98–110)
CO2: 28 mmol/L (ref 20–32)
Creat: 1.17 mg/dL (ref 0.70–1.33)
GLOBULIN: 2.3 g/dL (ref 1.9–3.7)
Glucose, Bld: 80 mg/dL (ref 65–99)
POTASSIUM: 5 mmol/L (ref 3.5–5.3)
Sodium: 139 mmol/L (ref 135–146)
Total Protein: 6.7 g/dL (ref 6.1–8.1)

## 2017-10-30 LAB — HIV-1 RNA QUANT-NO REFLEX-BLD
HIV 1 RNA Quant: <20 copies/mL
HIV-1 RNA QUANT, LOG: NOT DETECTED {Log_copies}/mL

## 2017-11-04 ENCOUNTER — Ambulatory Visit: Payer: BLUE CROSS/BLUE SHIELD | Admitting: Infectious Diseases

## 2017-11-04 ENCOUNTER — Ambulatory Visit: Payer: BLUE CROSS/BLUE SHIELD | Admitting: Licensed Clinical Social Worker

## 2017-11-05 ENCOUNTER — Institutional Professional Consult (permissible substitution): Payer: BLUE CROSS/BLUE SHIELD | Admitting: Licensed Clinical Social Worker

## 2017-11-11 ENCOUNTER — Ambulatory Visit: Payer: BLUE CROSS/BLUE SHIELD | Admitting: Licensed Clinical Social Worker

## 2017-12-25 ENCOUNTER — Other Ambulatory Visit: Payer: Self-pay | Admitting: Infectious Diseases

## 2017-12-25 DIAGNOSIS — B20 Human immunodeficiency virus [HIV] disease: Secondary | ICD-10-CM

## 2017-12-30 ENCOUNTER — Ambulatory Visit: Payer: BLUE CROSS/BLUE SHIELD | Admitting: Infectious Diseases

## 2017-12-30 ENCOUNTER — Encounter

## 2018-01-24 ENCOUNTER — Other Ambulatory Visit: Payer: Self-pay | Admitting: Internal Medicine

## 2018-01-24 DIAGNOSIS — I2510 Atherosclerotic heart disease of native coronary artery without angina pectoris: Secondary | ICD-10-CM

## 2018-01-24 DIAGNOSIS — Z9861 Coronary angioplasty status: Principal | ICD-10-CM

## 2018-01-24 DIAGNOSIS — E785 Hyperlipidemia, unspecified: Secondary | ICD-10-CM

## 2018-01-26 NOTE — Telephone Encounter (Signed)
Please review for refill, Thanks !  

## 2018-01-27 ENCOUNTER — Other Ambulatory Visit: Payer: Self-pay | Admitting: Internal Medicine

## 2018-01-27 DIAGNOSIS — E785 Hyperlipidemia, unspecified: Secondary | ICD-10-CM

## 2018-01-27 DIAGNOSIS — Z9861 Coronary angioplasty status: Principal | ICD-10-CM

## 2018-01-27 DIAGNOSIS — I2510 Atherosclerotic heart disease of native coronary artery without angina pectoris: Secondary | ICD-10-CM

## 2018-02-26 ENCOUNTER — Ambulatory Visit: Payer: BLUE CROSS/BLUE SHIELD | Admitting: Infectious Diseases

## 2018-04-27 ENCOUNTER — Ambulatory Visit: Payer: Self-pay

## 2018-04-30 ENCOUNTER — Other Ambulatory Visit: Payer: Self-pay | Admitting: Infectious Diseases

## 2018-04-30 ENCOUNTER — Telehealth: Payer: Self-pay

## 2018-04-30 DIAGNOSIS — B20 Human immunodeficiency virus [HIV] disease: Secondary | ICD-10-CM

## 2018-04-30 NOTE — Telephone Encounter (Signed)
Called patient to schedule an appointment with our office for lab work/office visit. Patient was able to take my call, and states he would like to hold off on a appointment until he gets his insurance "straigtned out" Patient will call office once he is ready to schedule appointment. [place]

## 2018-05-12 ENCOUNTER — Encounter: Payer: Self-pay | Admitting: Infectious Diseases

## 2018-05-14 ENCOUNTER — Ambulatory Visit: Payer: Self-pay

## 2018-05-14 ENCOUNTER — Telehealth: Payer: Self-pay | Admitting: Pharmacy Technician

## 2018-05-14 ENCOUNTER — Other Ambulatory Visit: Payer: Self-pay

## 2018-05-14 NOTE — Telephone Encounter (Signed)
[facility] Patient Advocate Encounter  Completed and sent [employer] Advancing Access application for [place] for this patient who is uninsured until 05/27/2018 when new insurance is active.  Patient is approved for 30 days.  He is aware and has the pharmacy card information for needed refill.  BIN      [id number] PCN    [id number] GRP    [id number] ID        [id number]   Albert Brown Patient [facility] for Infectious Disease Phone: [phone] Fax:  [phone]

## 2018-05-24 ENCOUNTER — Other Ambulatory Visit: Payer: Self-pay | Admitting: Cardiovascular Disease

## 2018-05-24 DIAGNOSIS — Z9861 Coronary angioplasty status: Principal | ICD-10-CM

## 2018-05-24 DIAGNOSIS — E785 Hyperlipidemia, unspecified: Secondary | ICD-10-CM

## 2018-05-24 DIAGNOSIS — I2510 Atherosclerotic heart disease of native coronary artery without angina pectoris: Secondary | ICD-10-CM

## 2018-06-02 ENCOUNTER — Other Ambulatory Visit: Payer: Self-pay | Admitting: Behavioral Health

## 2018-06-02 ENCOUNTER — Other Ambulatory Visit (HOSPITAL_COMMUNITY)
Admission: RE | Admit: 2018-06-02 | Discharge: 2018-06-02 | Disposition: A | Discharge status: Home / Self Care | Payer: PRIVATE HEALTH INSURANCE | Source: Ambulatory Visit | Attending: Infectious Diseases | Admitting: Infectious Diseases

## 2018-06-02 ENCOUNTER — Other Ambulatory Visit: Payer: Self-pay

## 2018-06-02 ENCOUNTER — Other Ambulatory Visit: Payer: PRIVATE HEALTH INSURANCE

## 2018-06-02 DIAGNOSIS — Z113 Encounter for screening for infections with a predominantly sexual mode of transmission: Secondary | ICD-10-CM | POA: Diagnosis not present

## 2018-06-02 DIAGNOSIS — B20 Human immunodeficiency virus [HIV] disease: Secondary | ICD-10-CM

## 2018-06-02 DIAGNOSIS — Z79899 Other long term (current) drug therapy: Secondary | ICD-10-CM

## 2018-06-02 MED ORDER — EMTRICITAB-RILPIVIR-TENOFOV AF 200-25-25 MG PO TABS: 1.0000 | ORAL_TABLET | Freq: Every day | ORAL | 0 refills | Status: DC

## 2018-06-03 ENCOUNTER — Telehealth: Payer: Self-pay | Admitting: Behavioral Health

## 2018-06-03 ENCOUNTER — Telehealth: Payer: Self-pay | Admitting: Pharmacy Technician

## 2018-06-03 LAB — URINE CYTOLOGY ANCILLARY ONLY
Chlamydia: NEGATIVE
Neisseria Gonorrhea: NEGATIVE

## 2018-06-03 LAB — T-HELPER CELL (CD4) - (RCID CLINIC ONLY)
CD4 % Helper T Cell: 30 % — ABNORMAL LOW (ref 33–55)
CD4 T Cell Abs: 520 /uL (ref 400–2700)

## 2018-06-03 NOTE — Telephone Encounter (Signed)
[facility] Patient Advocate Encounter   Received notification from [place] that prior authorization for Vernell Leep is required.   PA submitted on 06/03/2018 Key [id number] Status is pending    [facility] will continue to follow.   Albert Brown. Nadara Mustard [place] Patient [facility] for Infectious Disease Phone: [phone] Fax:  [phone]

## 2018-06-03 NOTE — Telephone Encounter (Signed)
Prior Authorization initiated through [employer] my [employer]s for [employer].  Will receive an approval or denial in 24 hours. Contact number for [employer] from [place]  [facility] RN

## 2018-06-04 NOTE — Telephone Encounter (Signed)
PA for Albert Brown was approved for 365 days.  [place] .  Called patient made him aware as well.  Patient verbalized understanding. Pricilla Riffle RN

## 2018-06-06 LAB — CBC WITH DIFFERENTIAL/PLATELET
Absolute Monocytes: 600 {cells}/uL (ref 200–950)
Basophils Absolute: 7 {cells}/uL (ref 0–200)
Basophils Relative: 0.1 %
Eosinophils Absolute: 221 {cells}/uL (ref 15–500)
Eosinophils Relative: 3.2 %
HCT: 43.4 % (ref 38.5–50.0)
Hemoglobin: 14.7 g/dL (ref 13.2–17.1)
Lymphs Abs: 1746 {cells}/uL (ref 850–3900)
MCH: 30.6 pg (ref 27.0–33.0)
MCHC: 33.9 g/dL (ref 32.0–36.0)
MCV: 90.2 fL (ref 80.0–100.0)
MPV: 11.3 fL (ref 7.5–12.5)
Monocytes Relative: 8.7 %
Neutro Abs: 4326 {cells}/uL (ref 1500–7800)
Neutrophils Relative %: 62.7 %
Platelets: 191 Thousand/uL (ref 140–400)
RBC: 4.81 Million/uL (ref 4.20–5.80)
RDW: 12.2 % (ref 11.0–15.0)
Total Lymphocyte: 25.3 %
WBC: 6.9 Thousand/uL (ref 3.8–10.8)

## 2018-06-06 LAB — COMPREHENSIVE METABOLIC PANEL
AG Ratio: 1.9 (calc) (ref 1.0–2.5)
ALT: 26 U/L (ref 9–46)
AST: 20 U/L (ref 10–35)
Albumin: 4.5 g/dL (ref 3.6–5.1)
Alkaline phosphatase (APISO): 95 U/L (ref 35–144)
BUN: 16 mg/dL (ref 7–25)
CO2: 25 mmol/L (ref 20–32)
Calcium: 9.7 mg/dL (ref 8.6–10.3)
Chloride: 105 mmol/L (ref 98–110)
Creat: 1.04 mg/dL (ref 0.70–1.25)
Globulin: 2.4 g/dL (calc) (ref 1.9–3.7)
Glucose, Bld: 86 mg/dL (ref 65–99)
Potassium: 4.6 mmol/L (ref 3.5–5.3)
Sodium: 139 mmol/L (ref 135–146)
Total Bilirubin: 0.3 mg/dL (ref 0.2–1.2)
Total Protein: 6.9 g/dL (ref 6.1–8.1)

## 2018-06-06 LAB — HIV-1 RNA QUANT-NO REFLEX-BLD
HIV 1 RNA Quant: <20 copies/mL
HIV-1 RNA Quant, Log: <1.3 Log copies/mL

## 2018-06-06 LAB — RPR: RPR Ser Ql: NONREACTIVE

## 2018-06-06 LAB — LIPID PANEL
Cholesterol: 126 mg/dL (ref <200)
HDL: 43 mg/dL (ref >=40)
LDL Cholesterol (Calc): 62 mg/dL (calc)
Non-HDL Cholesterol (Calc): 83 mg/dL (calc) (ref <130)
Total CHOL/HDL Ratio: 2.9 (calc) (ref <5.0)
Triglycerides: 133 mg/dL (ref <150)

## 2018-06-15 NOTE — Telephone Encounter (Signed)
Patient called stating he was unable to get his medications.  Called [employer] of [place] to troubleshoot the issue.  Prior Authorization approved according to [employer].  Let Juliann Pulse know and patient has PCAP.  Juliann Pulse to call To find out more information. Pricilla Riffle RN

## 2018-06-17 ENCOUNTER — Telehealth: Payer: Self-pay | Admitting: *Deleted

## 2018-06-17 NOTE — Telephone Encounter (Signed)
Received message in triage from patient's pharmacist Alvis Lemmings.  She was calling him for a regular check-in when he reported a rash/hives that have been occurring off and on since February 6th. Per Alvis Lemmings, patient has eliminated any diet/detergent causes of rash. She is concerned that his Vernell Leep may be causing a rash. Please advise. Landis Gandy, RN

## 2018-06-18 ENCOUNTER — Telehealth: Payer: Self-pay | Admitting: Infectious Diseases

## 2018-06-18 ENCOUNTER — Other Ambulatory Visit: Payer: Self-pay

## 2018-06-18 DIAGNOSIS — L509 Urticaria, unspecified: Secondary | ICD-10-CM

## 2018-06-18 MED ORDER — PREDNISONE 10 MG (21) PO TBPK: ORAL_TABLET | ORAL | 0 refills | Status: DC

## 2018-06-18 NOTE — Progress Notes (Signed)
Spoke with pharmacist at [facility] who was able to take verbal order. Pharmacy will notify patient once prescription is ready to pick up Albert Brown, [name]

## 2018-06-18 NOTE — Telephone Encounter (Signed)
Called pt to discuss his allergies/hives since Feb 2020. After wearing an unwashed shit. Has not improved with po otc.  He has been on no new medications, soaps, shampoos, detergents, perfumes  Will give him steroid pack.  He will get allergy appt for f/u  HIV 1 RNA Quant (copies/mL)  Date Value  06/02/2018 <20 NOT DETECTED  10/28/2017 <20 NOT DETECTED  04/16/2017 <20 NOT DETECTED   CD4 T Cell Abs (/uL)  Date Value  06/02/2018 520  10/28/2017 430  04/16/2017 630

## 2018-06-18 NOTE — Telephone Encounter (Signed)
Could consider dovato or biktarvy.  Will call him in clinic this pm thanks

## 2018-06-23 ENCOUNTER — Other Ambulatory Visit: Payer: Self-pay

## 2018-06-23 ENCOUNTER — Ambulatory Visit: Payer: PRIVATE HEALTH INSURANCE

## 2018-06-23 ENCOUNTER — Ambulatory Visit: Payer: Self-pay | Admitting: Infectious Diseases

## 2018-07-10 ENCOUNTER — Other Ambulatory Visit: Payer: Self-pay | Admitting: Internal Medicine

## 2018-07-10 ENCOUNTER — Other Ambulatory Visit: Payer: Self-pay | Admitting: Infectious Diseases

## 2018-07-10 DIAGNOSIS — Z9861 Coronary angioplasty status: Secondary | ICD-10-CM

## 2018-07-10 DIAGNOSIS — E785 Hyperlipidemia, unspecified: Secondary | ICD-10-CM

## 2018-07-10 DIAGNOSIS — B20 Human immunodeficiency virus [HIV] disease: Secondary | ICD-10-CM

## 2018-07-10 DIAGNOSIS — I2510 Atherosclerotic heart disease of native coronary artery without angina pectoris: Secondary | ICD-10-CM

## 2018-07-10 NOTE — Telephone Encounter (Signed)
Refill Request.  

## 2018-07-23 ENCOUNTER — Telehealth: Payer: Self-pay

## 2018-07-23 NOTE — Telephone Encounter (Signed)
Patient called office requesting contact information for Internal Medicine for primary care. Was able to provide patient with Internal Medicine phone number. Patient will call them to schedule appointment to establish care. [place]

## 2018-08-04 ENCOUNTER — Telehealth: Payer: Self-pay

## 2018-08-04 NOTE — Telephone Encounter (Signed)
Patient called this morning requesting information for primary care. Patient is insured and will need to find a PCP in network for his insurance. Was able to provide patient with name and number for internal medicine and [facility].  Patient will call offices to set up an appointment. Albert Brown

## 2018-08-11 ENCOUNTER — Other Ambulatory Visit: Payer: Self-pay

## 2018-08-11 ENCOUNTER — Encounter: Payer: Self-pay | Admitting: Family

## 2018-08-11 ENCOUNTER — Telehealth: Payer: Self-pay

## 2018-08-11 ENCOUNTER — Ambulatory Visit (INDEPENDENT_AMBULATORY_CARE_PROVIDER_SITE_OTHER): Payer: PRIVATE HEALTH INSURANCE | Admitting: Family

## 2018-08-11 VITALS — BP 156/85 | HR 76 | Temp 98.2°F | Wt 186.0 lb

## 2018-08-11 DIAGNOSIS — L508 Other urticaria: Secondary | ICD-10-CM | POA: Insufficient documentation

## 2018-08-11 DIAGNOSIS — L509 Urticaria, unspecified: Secondary | ICD-10-CM

## 2018-08-11 MED ORDER — MONTELUKAST SODIUM 10 MG PO TABS: 10.0000 mg | ORAL_TABLET | Freq: Every day | ORAL | 1 refills | Status: DC

## 2018-08-11 NOTE — Telephone Encounter (Signed)
Patient calling " he is getting the run around from [place]" .  He has been calling to establish is primary care provider.     Since he is insured I have offered other options . As for now he has been scheduled to see our NP today.  He is having itching since Feb 2020 and "cant take it no more.   I ruled out new linen, clothing, detergents . Etc.   Laverle Patter, RN

## 2018-08-11 NOTE — Assessment & Plan Note (Signed)
Albert Brown appears to have urticaria of unclear origin that has been going on for about 4 months now. He continues to have symptoms despite making changes to his diet, laundry soaps, and body care products. Severity is currently affecting his lifestyle. Will continue Zyrtec and add montelukast to see if that improves his symptoms. In the meantime will refer him to allergy testing. Unlikely his HIV medications, but could switch to Dovato or possibly Biktarvy.

## 2018-08-11 NOTE — Progress Notes (Signed)
Subjective:    Patient ID: Albert Brown, male    DOB: 10/09/1955, 63 y.o.   MRN: [id number]  Chief Complaint  Patient presents with  . Rash     HPI:  Albert Brown is a 63 y.o. male with HIV disease and CAD s/p PCI presents today for an acute office visit.  Albert Brown has been experiencing hives/itching that has been going on since February 6th having been to his brother's house and located on his arms, back and chest. Symptoms have been refractory to prednisone course and changing laundry powder/soap, body care products. He has also tried Zyrtec. When he gets up in the morning he describes a sensation of flushing and burning in his legs He has remained on Odefsey. He feels his mental health has been struggling because of his hives, coronavirus pandemic and being away from family, and stressful job. Feels like his depression is increasing. No fevers, chills or sweats.   He has had had lips and eye swelling that lasts for a couple of hours at a time. No shortness of breath or difficulty breathing.    Allergies  Allergen Reactions  . Brilinta [Ticagrelor] Shortness Of Breath    BREATHING ISSUES  . Codeine Anaphylaxis  . Bactrim [Sulfamethoxazole-Trimethoprim]     "Pt stated he died when taking this"  . Ciprofloxacin     REACTION: rash  . Dapsone     REACTION: severe-anaphylaxis  . Pravastatin Other (See Comments)    Muscle cramps  . Sulfamethoxazole-Trimethoprim     Anaphylaxis       Outpatient Medications Prior to Visit  Medication Sig Dispense Refill  . aspirin EC 81 MG tablet Take 1 tablet (81 mg total) by mouth daily. 90 tablet 3  . Coenzyme Q10-Vitamin E (QUNOL ULTRA COQ10 PO) Take 2 Doses/Fill by mouth daily. Take 2 teaspoons by mouth daily.    . Multiple Vitamin (MULTI VITAMIN MENS PO) Take 1 tablet by mouth daily.    . ODEFSEY 200-25-25 MG TABS tablet TAKE 1 TABLET BY MOUTH EVERY DAY WITH BREAKFAST 30 tablet 4  . simvastatin (ZOCOR) 20 MG tablet TAKE 1 TABLET  BY MOUTH DAILY AT 6 PM 30 tablet 1  . traZODone (DESYREL) 50 MG tablet Take 1 tablet (50 mg total) by mouth at bedtime as needed for sleep. 30 tablet 3  . amLODipine (NORVASC) 2.5 MG tablet Take 1 tablet (2.5 mg total) by mouth daily. (Patient not taking: Reported on 08/11/2018) 90 tablet 3  . fluticasone (FLONASE) 50 MCG/ACT nasal spray Place 2 sprays into both nostrils daily. (Patient not taking: Reported on 08/11/2018) 16 g 0  . nitroGLYCERIN (NITROSTAT) 0.4 MG SL tablet Place 1 tablet (0.4 mg total) under the tongue every 5 (five) minutes as needed for chest pain. (Patient not taking: Reported on 08/11/2018) 25 tablet 3  . predniSONE (STERAPRED UNI-PAK 21 TAB) 10 MG (21) TBPK tablet 4 po on day 1, 2, .  3 po on day 4, 5 2 po on day 6, 7 1 po on day 8, 9, 10 (Patient not taking: Reported on 08/11/2018) 21 tablet 0   No facility-administered medications prior to visit.      Past Medical History:  Diagnosis Date  . ALLERGIC RHINITIS   . Allergic sinusitis   . Anxiety state, unspecified   . CAD S/P percutaneous coronary angioplasty 08/08/2013   mRCA  Xience Alpine DES 2.75 mm x 33 mm (3.0 mm)  . Cerumen impaction, right   .  DEPRESSION   . Diarrhea   . Erectile dysfunction   . EXTERNAL HEMORRHOIDS WITHOUT MENTION COMP   . GERD   . HIV DISEASE   . HIV infection ([place])   . HYPOGONADISM   . Non-ST elevated myocardial infarction (non-STEMI) ([place]) 08/08/2013   SubAcute100% RCA -- PCI; Echo: EF 55-605, Normal LV Size & Function - ~mild HK of Inferior Myocardium with mildly reduced RV function.  Normal Valve function  . Periodontal disease   . PERIPHERAL NEUROPATHY   . PHARYNGITIS, RECURRENT   . Pterygium of left eye   . Urinary frequency   . VERTIGO      Past Surgical History:  Procedure Laterality Date  . LEFT HEART CATHETERIZATION WITH CORONARY ANGIOGRAM N/A 08/08/2013   Procedure: LEFT HEART CATHETERIZATION WITH CORONARY ANGIOGRAM;  Surgeon: [name], MD;  Location: [facility] CATH  LAB;  Service: Cardiovascular;  Laterality: N/A;  . PERCUTANEOUS CORONARY STENT INTERVENTION (PCI-S)  08/08/2013   mRCA 100% --> Xience Alpine DES 2.75 mm x 33 mm --> 3.0 mm       Review of Systems  Constitutional: Negative for appetite change, chills, fatigue, fever and unexpected weight change.  Eyes: Negative for visual disturbance.  Respiratory: Negative for cough, chest tightness, shortness of breath and wheezing.   Cardiovascular: Negative for chest pain and leg swelling.  Gastrointestinal: Negative for abdominal pain, constipation, diarrhea, nausea and vomiting.  Genitourinary: Negative for dysuria, flank pain, frequency, genital sores, hematuria and urgency.  Skin: Positive for rash.  Allergic/Immunologic: Negative for immunocompromised state.  Neurological: Negative for dizziness and headaches.      Objective:    BP (!) 156/85   Pulse 76   Temp 98.2 F (36.8 C) (Oral)   Wt 186 lb (84.4 kg)   BMI 27.87 kg/m  Nursing note and vital signs reviewed.  Physical Exam Constitutional:      General: He is not in acute distress.    Appearance: He is well-developed.  Cardiovascular:     Rate and Rhythm: Normal rate and regular rhythm.     Heart sounds: Normal heart sounds.  Pulmonary:     Effort: Pulmonary effort is normal.     Breath sounds: Normal breath sounds.  Skin:    General: Skin is warm and dry.     Comments: Urticaria noted on bilateral upper extremities, chest, back and some lower extremity.   Neurological:     Mental Status: He is alert and oriented to person, place, and time.  Psychiatric:        Behavior: Behavior normal.        Thought Content: Thought content normal.        Judgment: Judgment normal.        Assessment & Plan:   Problem List Items Addressed This Visit      Musculoskeletal and Integument   Urticaria - Primary    Albert Brown appears to have urticaria of unclear origin that has been going on for about 4 months now. He continues to  have symptoms despite making changes to his diet, laundry soaps, and body care products. Severity is currently affecting his lifestyle. Will continue Zyrtec and add montelukast to see if that improves his symptoms. In the meantime will refer him to allergy testing. Unlikely his HIV medications, but could switch to Dovato or possibly Biktarvy.       Relevant Orders   Ambulatory referral to Allergy       I am having Sherren Kerns. Bruschi start  on montelukast. I am also having him maintain his Multiple Vitamin (MULTI VITAMIN MENS PO), nitroGLYCERIN, aspirin EC, fluticasone, traZODone, Coenzyme Q10-Vitamin E (QUNOL ULTRA COQ10 PO), amLODipine, predniSONE, Odefsey, and simvastatin.   Meds ordered this encounter  Medications  . montelukast (SINGULAIR) 10 MG tablet    Sig: Take 1 tablet (10 mg total) by mouth at bedtime.    Dispense:  30 tablet    Refill:  1    Order Specific Question:   Supervising Provider    Answer:   Carlyle Basques [[id number]]     Follow-up: Return if symptoms worsen or fail to improve.   Terri Piedra, MSN, FNP-C Nurse Practitioner [facility] for Infectious Disease [place] number: [phone]

## 2018-08-11 NOTE — Patient Instructions (Signed)
Nice to meet you.  We will try you on Singulair to see if that helps.  A referral to Allergy clinic will be in process.  Continue to take your Odefsey.  Keep your follow up with Primary Care.  Please continue to follow up with Dr. Johnnye Sima.

## 2018-08-12 ENCOUNTER — Telehealth: Payer: Self-pay | Admitting: Pharmacy Technician

## 2018-08-12 NOTE — Telephone Encounter (Signed)
[facility] Patient Advocate Encounter   Was successful in obtaining a Viiv copay card for Dovato or any other Viiv HIV medication.  This copay card will make the patients copay 0.  I have spoken with the patient.    The billing information is as follows and has been shared with [place].  RxBin: [id number] PCN: [id number] Member ID: [id number] Group ID: [id number]  [place] Nadara Mustard [place] Patient [facility] for Infectious Disease Phone: [phone] Fax:  [phone]

## 2018-08-19 ENCOUNTER — Other Ambulatory Visit: Payer: Self-pay

## 2018-08-19 ENCOUNTER — Ambulatory Visit: Payer: PRIVATE HEALTH INSURANCE | Admitting: Internal Medicine

## 2018-08-19 VITALS — BP 132/93 | HR 76 | Temp 98.4°F | Ht 68.0 in | Wt 183.9 lb

## 2018-08-19 DIAGNOSIS — L503 Dermatographic urticaria: Secondary | ICD-10-CM

## 2018-08-19 DIAGNOSIS — I2510 Atherosclerotic heart disease of native coronary artery without angina pectoris: Secondary | ICD-10-CM | POA: Diagnosis not present

## 2018-08-19 DIAGNOSIS — L509 Urticaria, unspecified: Secondary | ICD-10-CM | POA: Diagnosis not present

## 2018-08-19 DIAGNOSIS — I10 Essential (primary) hypertension: Secondary | ICD-10-CM

## 2018-08-19 DIAGNOSIS — Z1211 Encounter for screening for malignant neoplasm of colon: Secondary | ICD-10-CM

## 2018-08-19 DIAGNOSIS — E785 Hyperlipidemia, unspecified: Secondary | ICD-10-CM | POA: Diagnosis not present

## 2018-08-19 DIAGNOSIS — Z21 Asymptomatic human immunodeficiency virus [HIV] infection status: Secondary | ICD-10-CM

## 2018-08-19 DIAGNOSIS — Z79899 Other long term (current) drug therapy: Secondary | ICD-10-CM

## 2018-08-19 DIAGNOSIS — Z955 Presence of coronary angioplasty implant and graft: Secondary | ICD-10-CM

## 2018-08-19 NOTE — Patient Instructions (Addendum)
Mr. Hoopes, It was great meeting you! We are glad to having you establishing in our clinic.   I am so sorry you have been dealing with hives for this long. I am hopeful that the allergist appointment will help determine a trigger, as I do suspect it is environmental. I am going to do some basic lab work here to evaluate if we need to pursue other work-up. Unfortunately, there are not any other good treatments other than anti-histamines until we know specifically what it is.   I'd like you to try getting back on your blood pressure medication. Good blood pressure control is especially important given your cardiac history.   I will place a referral to get your colonoscopy screening done.   We will follow-up in approximately 3-4 weeks once your allergy testing is complete.   Take care! Dr. Koleen Distance

## 2018-08-20 LAB — CBC WITH DIFFERENTIAL/PLATELET
Basophils Absolute: 0 10*3/uL (ref 0.0–0.2)
Basos: 0 %
EOS (ABSOLUTE): 0.3 10*3/uL (ref 0.0–0.4)
Eos: 4 %
Hematocrit: 44.5 % (ref 37.5–51.0)
Hemoglobin: 15.5 g/dL (ref 13.0–17.7)
Immature Grans (Abs): 0 10*3/uL (ref 0.0–0.1)
Immature Granulocytes: 0 %
Lymphocytes Absolute: 1.8 10*3/uL (ref 0.7–3.1)
Lymphs: 29 %
MCH: 30.4 pg (ref 26.6–33.0)
MCHC: 34.8 g/dL (ref 31.5–35.7)
MCV: 87 fL (ref 79–97)
Monocytes Absolute: 0.7 10*3/uL (ref 0.1–0.9)
Monocytes: 12 %
Neutrophils Absolute: 3.6 10*3/uL (ref 1.4–7.0)
Neutrophils: 55 %
Platelets: 187 10*3/uL (ref 150–450)
RBC: 5.1 x10E6/uL (ref 4.14–5.80)
RDW: 12.4 % (ref 11.6–15.4)
WBC: 6.4 10*3/uL (ref 3.4–10.8)

## 2018-08-20 LAB — TSH: TSH: <0.006 u[IU]/mL — ABNORMAL LOW (ref 0.450–4.500)

## 2018-08-20 LAB — SEDIMENTATION RATE: Sed Rate: 5 mm/hr (ref 0–30)

## 2018-08-20 LAB — C-REACTIVE PROTEIN: CRP: 1 mg/L (ref 0–10)

## 2018-08-20 NOTE — Progress Notes (Signed)
CC: urticaria   HPI:  [name] is a 63 y.o. gentleman with history of HTN, HLD, HIV well controlled on Odefsey, CAD s/p DES in 2015 who presents to establish care and with an acute complaint of urticaria.   Patient first noticed hives on 04/02/18. They began on his trunk. Since then, the rash has spread to his extremities. He will have intermittent lip swelling. He has changed his sheets, detergent and soaps without any improvement. Also tried cutting gluten out of his diet, but did not not notice any improvement. Denies any new exposures or previous history of allergies. He has a cat, but has had her for 13 years. He notes his symptoms are slightly worse in the morning. He has been prescribed Prednisone and Symbicort without any relief. Anti-histamines have kept his symptoms somewhat manageable during the workday. He has been referred for allergy testing by his infectious disease doctor which is scheduled for early next month.   No fevers, chills, weight loss, joint pain, GI symptoms, dysuria, hematuria, shortness of breath.   Family History  Problem Relation Age of Onset   Prostate cancer Father    Osteosarcoma Father    Prostate cancer Brother    Prostate cancer Brother    Cancer Brother    Cancer Mother        brain tumor   Rheum arthritis Mother    Social: former tobacco user, drinks socially, no illicit drug use.   Past Medical History:  Diagnosis Date   ALLERGIC RHINITIS    Allergic sinusitis    Anxiety state, unspecified    CAD S/P percutaneous coronary angioplasty 08/08/2013   mRCA  Xience Alpine DES 2.75 mm x 33 mm (3.0 mm)   Cerumen impaction, right    DEPRESSION    Diarrhea    Erectile dysfunction    EXTERNAL HEMORRHOIDS WITHOUT MENTION COMP    GERD    HIV DISEASE    HIV infection ([place])    HYPOGONADISM    Non-ST elevated myocardial infarction (non-STEMI) ([place]) 08/08/2013   SubAcute100% RCA -- PCI; Echo: EF 55-605, Normal LV Size &  Function - ~mild HK of Inferior Myocardium with mildly reduced RV function.  Normal Valve function   Periodontal disease    PERIPHERAL NEUROPATHY    PHARYNGITIS, RECURRENT    Pterygium of left eye    Urinary frequency    VERTIGO    Family History  Problem Relation Age of Onset   Prostate cancer Father    Osteosarcoma Father    Prostate cancer Brother    Prostate cancer Brother    Cancer Brother    Cancer Mother        brain tumor   Rheum arthritis Mother      Review of Systems: Review of Systems  All other systems reviewed and are negative.   Physical Exam:  Vitals:   08/19/18 1502  BP: (!) 132/93  Pulse: 76  Temp: 98.4 F (36.9 C)  TempSrc: Oral  SpO2: 100%  Weight: 183 lb 14.4 oz (83.4 kg)  Height: 5\' 8"  (1.727 m)   Physical Exam Constitutional:      General: He is not in acute distress.    Appearance: Normal appearance.  Eyes:     Conjunctiva/sclera: Conjunctivae normal.     Pupils: Pupils are equal, round, and reactive to light.  Neck:     Musculoskeletal: Neck supple.  Cardiovascular:     Rate and Rhythm: Normal rate and regular rhythm.  Pulmonary:  Effort: Pulmonary effort is normal.     Breath sounds: Normal breath sounds.  Abdominal:     General: Bowel sounds are normal.     Palpations: Abdomen is soft.     Tenderness: There is no abdominal tenderness.  Musculoskeletal:     Right lower leg: No edema.     Left lower leg: No edema.  Lymphadenopathy:     Cervical: No cervical adenopathy.  Skin:    Comments: Urticarial rash most notably on lower extremities with scattered areas on trunk.   Neurological:     General: No focal deficit present.     Mental Status: He is alert and oriented to person, place, and time.  Psychiatric:        Mood and Affect: Mood normal.        Behavior: Behavior normal.      Assessment & Plan:   See Encounters Tab for problem based charting.  Patient discussed with Dr. Angelia Mould

## 2018-08-24 ENCOUNTER — Encounter: Payer: Self-pay | Admitting: Internal Medicine

## 2018-08-24 DIAGNOSIS — Z1211 Encounter for screening for malignant neoplasm of colon: Secondary | ICD-10-CM | POA: Insufficient documentation

## 2018-08-24 NOTE — Assessment & Plan Note (Signed)
Blood pressure mildly elevated at today's visit.  Patient is currently prescribed Amlodipine 2.5 mg, but has not been taking it for several months because he felt he didn't need it. I discussed with him the benefits of good blood pressure control, particularly with his cardiac history. He denies any symptoms of elevated blood pressure. He was agreeable to restarting his Amlodipine. Will continue to monitor at follow-up visits and adjust therapy as needed.

## 2018-08-24 NOTE — Assessment & Plan Note (Signed)
Patient has never had colonoscopy screening before. Will place referral to GI. No significant weight loss, changes in stools or family history.

## 2018-08-24 NOTE — Assessment & Plan Note (Signed)
Patient presents with 4 month history of diffuse urticaria. Pruritis seems quite severe and interfering with everyday life. He has tried changing sheets, soaps, detergents, body products, and diet without any improvement. Based on symptoms with waxing and waning nature, seems most likely environmental. Appointment with allergist will be helpful. Work-up today reveals normal CBC, CRP, and ESR. He does have low TSH. Will follow-up with free T4 and T3. May also need to consider switching HIV therapy, but will rule out other etiologies first.

## 2018-08-25 ENCOUNTER — Ambulatory Visit: Payer: PRIVATE HEALTH INSURANCE | Admitting: Internal Medicine

## 2018-08-25 NOTE — Progress Notes (Signed)
Internal Medicine Clinic Attending  Case discussed with Dr. Koleen Distance at the time of the visit.  We reviewed the resident's history and exam and pertinent patient test results.  I agree with the assessment, diagnosis, and plan of care documented in the resident's note.  Workup for urticaria has revealed low TSH, likely overt hyperthyroidism is the cause of his urticria, agree with plan to check free T4 and T3.

## 2018-08-31 ENCOUNTER — Ambulatory Visit: Payer: PRIVATE HEALTH INSURANCE | Admitting: Allergy

## 2018-09-01 ENCOUNTER — Other Ambulatory Visit: Payer: Self-pay

## 2018-09-01 ENCOUNTER — Other Ambulatory Visit (INDEPENDENT_AMBULATORY_CARE_PROVIDER_SITE_OTHER): Payer: PRIVATE HEALTH INSURANCE

## 2018-09-01 DIAGNOSIS — L503 Dermatographic urticaria: Secondary | ICD-10-CM | POA: Diagnosis not present

## 2018-09-02 LAB — T4, FREE: Free T4: 1.42 ng/dL (ref 0.82–1.77)

## 2018-09-02 LAB — T3: T3, Total: 174 ng/dL (ref 71–180)

## 2018-09-02 NOTE — Addendum Note (Signed)
Addended by: Joni Reining C on: 09/02/2018 11:28 AM   Modules accepted: Orders

## 2018-09-09 ENCOUNTER — Other Ambulatory Visit: Payer: Self-pay

## 2018-09-09 ENCOUNTER — Ambulatory Visit: Payer: PRIVATE HEALTH INSURANCE | Admitting: Allergy

## 2018-09-09 ENCOUNTER — Encounter: Payer: Self-pay | Admitting: *Deleted

## 2018-09-09 ENCOUNTER — Encounter: Payer: Self-pay | Admitting: Allergy

## 2018-09-09 VITALS — BP 166/90 | HR 82 | Temp 98.3°F | Resp 16 | Ht 68.0 in | Wt 184.4 lb

## 2018-09-09 DIAGNOSIS — I10 Essential (primary) hypertension: Secondary | ICD-10-CM

## 2018-09-09 DIAGNOSIS — L508 Other urticaria: Secondary | ICD-10-CM

## 2018-09-09 NOTE — Addendum Note (Signed)
Addended by: Garnet Sierras on: 09/09/2018 03:45 PM   Modules accepted: Orders

## 2018-09-09 NOTE — Assessment & Plan Note (Signed)
6 month history of almost daily hives. No triggers noted. No benefit from oral steroids. Initially zyrtec 10mg  daily did not seem to help but the last few weeks noticed improvement in hives when taking the medications. CBC diff normal, low TSH in June 2020. Free T4 normal, total T3 upper end of normal. No colonoscopy.   Today's skin testing showed: Negative to environmental allergy panel and basic common foods.  I do no think any foods or environmental allergies are contributing to the hives.   Get bloodwork as below.   Restart zyrtec 10mg  twice a day as long as it does not cause drowsiness.  Start proper skin care as below.   Avoid the following potential triggers: alcohol, tight clothing, NSAIDs.   Follow up with your PCP regarding your thyroid levels. It may be contributing to the hives.

## 2018-09-09 NOTE — Progress Notes (Signed)
New Patient Note  RE: Albert Brown MRN: [id number] DOB: 10-22-1955 Date of Office Visit: 09/09/2018  Referring provider: Golden Circle, FNP Primary care provider: Maudie Mercury, MD  Chief Complaint: Establish Care (urticaria)  History of Present Illness: I had the pleasure of seeing Albert Brown for initial evaluation at the [facility] on 09/09/2018. He is a 63 y.o. male, who is referred here by Maudie Mercury, MD for the evaluation of urticaria.   Rash started in February. He wore a shirt that he did not wash and thought it was this new shirt. The rash resolved but then it reoccurred and has been having daily hives. This an occur anywhere on his body. Describes them as raised, red, and pruritic. Individual rashes can last up to a few days. No ecchymosis upon resolution. Associated symptoms include: nausea, sometimes has some associated lip angioedema. Suspected triggers are unknown. Denies any fevers, chills, changes in medications, foods, personal care products or recent infections. He has tried the following therapies: zyrtec 10mg  daily with good benefit. Tried Singulair for 3 days with no benefit. Systemic steroids yes took 10 days with no benefit. Currently on zyrtec 10mg  daily.  He tried to avoid gluten with no benefit.  Reviewed images on the phone which were consistent with urticarial rash.   Past work up includes: none.  Dietary History: patient has been eating other foods including milk, eggs, peanut, treenuts, sesame, shellfish, seafood, soy, wheat, meats, fruits and vegetables.  Previous work up includes: CBC diff and TSH - low TSH. Previous history of rash/hives: no. Patient is up to date with the following cancer screening tests: not up to date with colonoscopy.  Assessment and Plan: Albert Brown is a 63 y.o. male with: Chronic urticaria 6 month history of almost daily hives. No triggers noted. No benefit from oral steroids. Initially zyrtec 10mg   daily did not seem to help but the last few weeks noticed improvement in hives when taking the medications. CBC diff normal, low TSH in June 2020. Free T4 normal, total T3 upper end of normal. No colonoscopy.   Today's skin testing showed: Negative to environmental allergy panel and basic common foods.  I do no think any foods or environmental allergies are contributing to the hives.   Get bloodwork as below.   Restart zyrtec 10mg  twice a day as long as it does not cause drowsiness.  Start proper skin care as below.   Avoid the following potential triggers: alcohol, tight clothing, NSAIDs.   Follow up with your PCP regarding your thyroid levels. It may be contributing to the hives.   Essential hypertension Patient stopped his antihypertensive medications. History of cardiac disease.  Advised patient to take blood pressure medication as prescribed. Blood pressure was 166/90 at the office today.   Return in about 4 weeks (around 10/07/2018).  Lab Orders     Comprehensive metabolic panel     Alpha-Albert Panel     ANA w/Reflex     C3 and C4     Tryptase     Chronic Urticaria  Other allergy screening: Asthma: no Rhino conjunctivitis: no Food allergy: no Medication allergy: yes  Bactrim - anaphylaxis  Codeine - anaphylaxis Hymenoptera allergy: no Urticaria: yes Eczema:no History of recurrent infections suggestive of immunodeficency: no  Diagnostics: Skin Testing: Environmental allergy panel and select foods. Negative test to: both panels.  Results discussed with patient/family. Airborne Adult Perc - 09/09/18 1457    Time Antigen Placed  1457  Allergen Manufacturer  Greer    Location  Back    Number of Test  59    1. Control-Buffer 50% Glycerol  Negative    2. Control-Histamine 1 mg/ml  3+    3. Albumin saline  Negative    4. [place]  Negative    5. [place]  Negative    6. [place]  Negative    7. [place] [address] Blue  Negative    8. Meadow Fescue  Negative    9. Perennial  Rye  Negative    10. Sweet Vernal  Negative    11. Timothy  Negative    12. Cocklebur  Negative    13. Burweed Marshelder  Negative    14. Ragweed, short  Negative    15. Ragweed, Giant  Negative    16. Plantain,  English  Negative    17. Lamb's Quarters  Negative    18. Sheep Sorrell  Negative    19. Rough Pigweed  Negative    20. Marsh Elder, Rough  Negative    21. Mugwort, Common  Negative    22. Ash mix  Negative    23. Birch mix  Negative    24. Beech American  Negative    25. Box, Elder  Negative    26. Cedar, red  Negative    27. Cottonwood, [place]  Negative    28. Elm mix  Negative    29. Hickory mix  Negative    30. Maple mix  Negative    31. Oak, [place] mix  Negative    32. Pecan Pollen  Negative    33. Pine mix  Negative    34. Sycamore Eastern  Negative    35. [place], Black Pollen  Negative    36. Alternaria alternata  Negative    37. Cladosporium Herbarum  Negative    38. Aspergillus mix  Negative    39. Penicillium mix  Negative    40. Bipolaris sorokiniana (Helminthosporium)  Negative    41. Drechslera spicifera (Curvularia)  Negative    42. Mucor plumbeus  Negative    43. Fusarium moniliforme  Negative    44. Aureobasidium pullulans (pullulara)  Negative    45. Rhizopus oryzae  Negative    46. Botrytis cinera  Negative    47. Epicoccum nigrum  Negative    48. Phoma betae  Negative    49. Candida Albicans  Negative    50. Trichophyton mentagrophytes  Negative    51. Mite, D Farinae  5,000 AU/ml  Negative    52. Mite, D Pteronyssinus  5,000 AU/ml  Negative    53. Cat Hair 10,000 BAU/ml  Negative    54.  Dog Epithelia  Negative    55. Mixed Feathers  Negative    56. Horse Epithelia  Negative    57. Cockroach, German  Negative    58. Mouse  Negative    59. Tobacco Leaf  Negative     Food Perc - 09/09/18 1458    Time Antigen Placed  1458    Allergen Manufacturer  Lavella Hammock    Location  Back    Number of allergen test  10    1. Peanut  Negative    2.  Soybean food  Negative    3. Wheat, whole  Negative    4. Sesame  Negative    5. Milk, cow  Negative    6. Egg White, chicken  Negative    7. Casein  Negative  8. Shellfish mix  Negative    9. Fish mix  Negative    10. Cashew  Negative       Past Medical History: Patient Active Problem List   Diagnosis Date Noted  . Encounter for screening colonoscopy 08/24/2018  . Chronic urticaria 08/11/2018  . Dyspnea on exertion 08/01/2017  . Coronary artery disease involving native coronary artery of native heart without angina pectoris 08/01/2017  . Insomnia 04/30/2017  . Essential hypertension 09/27/2016  . Hepatitis B immune 07/31/2016  . Hyperlipidemia LDL goal <70 12/05/2013  . Chest pain, central 12/05/2013  . Dyslipidemia 08/10/2013  . NSTEMI (non-ST elevated myocardial infarction) ([place]) 08/08/2013    Class: Hospitalized for  . CAD S/P percutaneous coronary angioplasty - Xience outline DES to mid RCA (2.75 mm x 33 mm --> 3.0 mm) 08/08/2013    Class: History of  . Presence of drug coated stent in right coronary artery 08/08/2013  . Chest pain, unspecified 03/09/2013  . Erectile dysfunction 01/18/2013  . Pterygium of left eye 08/03/2012  . Cerumen impaction, right 07/25/2011  . Periodontal disease 08/21/2010  . Allergic sinusitis 08/21/2010  . Hypotestosteronism 09/20/2009  . Anxiety state 09/20/2009  . URINARY FREQUENCY 03/16/2009  . PHARYNGITIS, RECURRENT 10/06/2008  . EXTERNAL HEMORRHOIDS WITHOUT MENTION COMP 09/15/2008  . DIARRHEA 09/15/2008  . Human immunodeficiency virus (HIV) disease ([place]) 08/04/2006  . Depression 08/04/2006  . PERIPHERAL NEUROPATHY 08/04/2006  . ALLERGIC RHINITIS 08/04/2006  . GERD 08/04/2006  . VERTIGO 08/04/2006   Past Medical History:  Diagnosis Date  . ALLERGIC RHINITIS   . Allergic sinusitis   . Anxiety state, unspecified   . CAD S/P percutaneous coronary angioplasty 08/08/2013   mRCA  Xience Alpine DES 2.75 mm x 33 mm (3.0 mm)  .  Cerumen impaction, right   . DEPRESSION   . Diarrhea   . Erectile dysfunction   . EXTERNAL HEMORRHOIDS WITHOUT MENTION COMP   . GERD   . HIV DISEASE   . HIV infection ([place])   . HYPOGONADISM   . Non-ST elevated myocardial infarction (non-STEMI) ([place]) 08/08/2013   SubAcute100% RCA -- PCI; Echo: EF 55-605, Normal LV Size & Function - ~mild HK of Inferior Myocardium with mildly reduced RV function.  Normal Valve function  . Periodontal disease   . PERIPHERAL NEUROPATHY   . PHARYNGITIS, RECURRENT   . Pterygium of left eye   . Urinary frequency   . Urticaria   . VERTIGO    Past Surgical History: Past Surgical History:  Procedure Laterality Date  . LEFT HEART CATHETERIZATION WITH CORONARY ANGIOGRAM N/A 08/08/2013   Procedure: LEFT HEART CATHETERIZATION WITH CORONARY ANGIOGRAM;  Surgeon: [name], MD;  Location[facility] C[facility] LAB;  Service: Cardiovascular;  Laterality: N/A;  . PERCUTANEOUS CORONARY STENT INTERVENTION (PCI-S)  08/08/2013   mRCA 100% --> Xience Alpine DES 2.75 mm x 33 mm --> 3.0 mm   Medication List:  Current Outpatient Medications  Medication Sig Dispense Refill  . aspirin EC 81 MG tablet Take 1 tablet (81 mg total) by mouth daily. 90 tablet 3  . Coenzyme Q10-Vitamin E (QUNOL ULTRA COQ10 PO) Take 2 Doses/Fill by mouth daily. Take 2 teaspoons by mouth daily.    . fluticasone (FLONASE) 50 MCG/ACT nasal spray Place 2 sprays into both nostrils daily. 16 g 0  . Multiple Vitamin (MULTI VITAMIN MENS PO) Take 1 tablet by mouth daily.    . nitroGLYCERIN (NITROSTAT) 0.4 MG SL tablet Place 1 tablet (0.4 mg total) under the  tongue every 5 (five) minutes as needed for chest pain. 25 tablet 3  . ODEFSEY 200-25-25 MG TABS tablet TAKE 1 TABLET BY MOUTH EVERY DAY WITH BREAKFAST 30 tablet 4  . simvastatin (ZOCOR) 20 MG tablet TAKE 1 TABLET BY MOUTH DAILY AT 6 PM 30 tablet 1  . traZODone (DESYREL) 50 MG tablet Take 1 tablet (50 mg total) by mouth at bedtime as needed for sleep. 30 tablet 3   . amLODipine (NORVASC) 2.5 MG tablet Take 1 tablet (2.5 mg total) by mouth daily. (Patient not taking: Reported on 09/09/2018) 90 tablet 3   No current facility-administered medications for this visit.    Allergies: Allergies  Allergen Reactions  . Brilinta [Ticagrelor] Shortness Of Breath    BREATHING ISSUES  . Codeine Anaphylaxis  . Bactrim [Sulfamethoxazole-Trimethoprim]     "Pt stated he died when taking this"  . Ciprofloxacin     REACTION: rash  . Dapsone     REACTION: severe-anaphylaxis  . Pravastatin Other (See Comments)    Muscle cramps  . Sulfamethoxazole-Trimethoprim     Anaphylaxis    Social History: Social History   Socioeconomic History  . Marital status: Single    Spouse name: Not on file  . Number of children: Not on file  . Years of education: Not on file  . Highest education level: Not on file  Occupational History  . Not on file  Social Needs  . Financial resource strain: Not on file  . Food insecurity    Worry: Not on file    Inability: Not on file  . Transportation needs    Medical: Not on file    Non-medical: Not on file  Tobacco Use  . Smoking status: Former Smoker    Types: Cigarettes    Quit date: 03/02/2007    Years since quitting: 11.5  . Smokeless tobacco: Never Used  . Tobacco comment: pt. no longer smokes  Substance and Sexual Activity  . Alcohol use: Yes    Comment: weekends  . Drug use: No  . Sexual activity: Never    Partners: Male    Comment: pt. refused condoms  Lifestyle  . Physical activity    Days per week: Not on file    Minutes per session: Not on file  . Stress: Not on file  Relationships  . Social Herbalist on phone: Not on file    Gets together: Not on file    Attends religious service: Not on file    Active member of club or organization: Not on file    Attends meetings of clubs or organizations: Not on file    Relationship status: Not on file  Other Topics Concern  . Not on file  Social History  Narrative  . Not on file   Lives in a house which is about 61 years old. Smoking: quit 10 years ago, 1 ppd x 10+ yrs Occupation: Advertising account planner History: Water Damage/mildew in the house: no Charity fundraiser in the family room: no Carpet in the bedroom: no Heating: gas Cooling: central Pet: yes 1 cat x 13 yrs  Family History: Family History  Problem Relation Age of Onset  . Prostate cancer Father   . Osteosarcoma Father   . Prostate cancer Brother   . Prostate cancer Brother   . Cancer Brother   . Cancer Mother        brain tumor  . Rheum arthritis Mother    Problem  Relation Asthma                                   No  Eczema                                No  Food allergy                          No  Allergic rhino conjunctivitis     No  Urticaria    No  Review of Systems  Constitutional: Negative for appetite change, chills, fever and unexpected weight change.  HENT: Negative for congestion and rhinorrhea.   Eyes: Negative for itching.  Respiratory: Negative for cough, chest tightness, shortness of breath and wheezing.   Cardiovascular: Negative for chest pain.  Gastrointestinal: Positive for nausea. Negative for abdominal pain.  Genitourinary: Negative for difficulty urinating.  Skin: Positive for rash.  Allergic/Immunologic: Negative for environmental allergies and food allergies.  Neurological: Negative for headaches.   Objective: BP (!) 166/90 (BP Location: Left Arm, Patient Position: Sitting)   Pulse 82   Temp 98.3 F (36.8 C)   Resp 16   Ht 5\' 8"  (1.727 m)   Wt 184 lb 6.4 oz (83.6 kg)   SpO2 97%   BMI 28.04 kg/m  Body mass index is 28.04 kg/m. Physical Exam  Constitutional: He is oriented to person, place, and time. He appears well-developed and well-nourished.  HENT:  Head: Normocephalic and atraumatic.  Left Ear: External ear normal.  Nose: Nose normal.  Mouth/Throat: Oropharynx is clear and moist.  Right  cerumen impaction  Eyes: Conjunctivae and EOM are normal.  Neck: Neck supple.  Cardiovascular: Normal rate, regular rhythm and normal heart sounds. Exam reveals no gallop and no friction rub.  No murmur heard. Pulmonary/Chest: Effort normal and breath sounds normal. He has no wheezes. He has no rales.  Abdominal: Soft. Bowel sounds are normal. There is no abdominal tenderness.  Neurological: He is alert and oriented to person, place, and time.  Skin: Skin is warm. Rash noted.  Raised, erythematous urticarial rash on lower extremities, back and posterior cervical neckline.   Psychiatric: He has a normal mood and affect. His behavior is normal.  Nursing note and vitals reviewed.  The plan was reviewed with the patient/family, and all questions/concerned were addressed.  It was my pleasure to see Albert Brown today and participate in his care. Please feel free to contact me with any questions or concerns.  Sincerely,  Rexene Alberts, DO Allergy & Immunology  [facility] office: [phone] [facility] office: [place] office: [phone]

## 2018-09-09 NOTE — Patient Instructions (Addendum)
Today's skin testing showed: Negative to environmental allergy panel and basic common foods. I do no think any foods or environmental allergies are contributing to your hives.   Get bloodwork  Restart zyrtec 10mg  twice a day as long as it does not cause drowsiness. Start proper skin care as below.  Avoid the following potential triggers: alcohol, tight clothing, NSAIDs.   Follow up with your PCP regarding your thyroid levels. Take your blood pressure medication as prescribed. Your blood pressure was 166/90 at the office today.   Follow up in 4 weeks.    Skin care recommendations  Bath time: . Always use lukewarm water. AVOID very hot or cold water. Marland Kitchen Keep bathing time to 5-10 minutes. . Do NOT use bubble bath. . Use a mild soap and use just enough to wash the dirty areas. . Do NOT scrub skin vigorously.  . After bathing, pat dry your skin with a towel. Do NOT rub or scrub the skin.  Moisturizers and prescriptions:  . ALWAYS apply moisturizers immediately after bathing (within 3 minutes). This helps to lock-in moisture. . Use the moisturizer several times a day over the whole body. Kermit Balo summer moisturizers include: Aveeno, CeraVe, Cetaphil. Kermit Balo winter moisturizers include: Aquaphor, Vaseline, Cerave, Cetaphil, Eucerin, Vanicream. . When using moisturizers along with medications, the moisturizer should be applied about one hour after applying the medication to prevent diluting effect of the medication or moisturize around where you applied the medications. When not using medications, the moisturizer can be continued twice daily as maintenance.  Laundry and clothing: . Avoid laundry products with added color or perfumes. . Use unscented hypo-allergenic laundry products such as Tide free, Cheer free & gentle, and All free and clear.  . If the skin still seems dry or sensitive, you can try double-rinsing the clothes. . Avoid tight or scratchy clothing such as wool. . Do not use  fabric softeners or dyer sheets.

## 2018-09-09 NOTE — Assessment & Plan Note (Signed)
Patient stopped his antihypertensive medications. History of cardiac disease.  Advised patient to take blood pressure medication as prescribed. Blood pressure was 166/90 at the office today.

## 2018-09-10 ENCOUNTER — Ambulatory Visit: Payer: PRIVATE HEALTH INSURANCE

## 2018-09-12 ENCOUNTER — Other Ambulatory Visit: Payer: Self-pay | Admitting: Internal Medicine

## 2018-09-12 DIAGNOSIS — E785 Hyperlipidemia, unspecified: Secondary | ICD-10-CM

## 2018-09-12 DIAGNOSIS — Z9861 Coronary angioplasty status: Secondary | ICD-10-CM

## 2018-09-12 DIAGNOSIS — I2510 Atherosclerotic heart disease of native coronary artery without angina pectoris: Secondary | ICD-10-CM

## 2018-09-14 NOTE — Telephone Encounter (Signed)
Please review for refill. Thank you! 

## 2018-09-15 NOTE — Telephone Encounter (Signed)
This is a [place] pt 

## 2018-09-16 LAB — COMPREHENSIVE METABOLIC PANEL
ALT: 19 IU/L (ref 0–44)
AST: 17 IU/L (ref 0–40)
Albumin/Globulin Ratio: 2.3 — ABNORMAL HIGH (ref 1.2–2.2)
Albumin: 4.4 g/dL (ref 3.8–4.9)
Alkaline Phosphatase: 106 IU/L (ref 39–117)
BUN/Creatinine Ratio: 18 (ref 10–24)
BUN: 19 mg/dL (ref 8–27)
Bilirubin Total: 0.3 mg/dL (ref 0.0–1.2)
CO2: 23 mmol/L (ref 20–29)
Calcium: 9.2 mg/dL (ref 8.6–10.2)
Chloride: 108 mmol/L — ABNORMAL HIGH (ref 96–106)
Creatinine, Ser: 1.06 mg/dL (ref 0.76–1.27)
GFR calc Af Amer: 88 mL/min/{1.73_m2} (ref >59)
GFR calc non Af Amer: 76 mL/min/{1.73_m2} (ref >59)
Globulin, Total: 1.9 g/dL (ref 1.5–4.5)
Glucose: 90 mg/dL (ref 65–99)
Potassium: 4.7 mmol/L (ref 3.5–5.2)
Sodium: 146 mmol/L — ABNORMAL HIGH (ref 134–144)
Total Protein: 6.3 g/dL (ref 6.0–8.5)

## 2018-09-16 LAB — ALPHA-GAL PANEL
Alpha Gal IgE*: <0.1 kU/L (ref <0.10)
Beef (Bos spp) IgE: <0.1 kU/L (ref <0.35)
Class Interpretation: 0
Class Interpretation: 0
Class Interpretation: 0
Lamb/Mutton (Ovis spp) IgE: <0.1 kU/L (ref <0.35)
Pork (Sus spp) IgE: <0.1 kU/L (ref <0.35)

## 2018-09-16 LAB — ENA+DNA/DS+SJORGEN'S
ENA RNP Ab: 1.7 AI — ABNORMAL HIGH (ref 0.0–0.9)
ENA SM Ab Ser-aCnc: <0.2 AI (ref 0.0–0.9)
ENA SSA (RO) Ab: <0.2 AI (ref 0.0–0.9)
ENA SSB (LA) Ab: <0.2 AI (ref 0.0–0.9)
dsDNA Ab: 1 IU/mL (ref 0–9)

## 2018-09-16 LAB — C3 AND C4
Complement C3, Serum: 119 mg/dL (ref 82–167)
Complement C4, Serum: 12 mg/dL — ABNORMAL LOW (ref 14–44)

## 2018-09-16 LAB — TRYPTASE: Tryptase: 5.7 ug/L (ref 2.2–13.2)

## 2018-09-16 LAB — CHRONIC URTICARIA: cu index: 40.7 — ABNORMAL HIGH (ref <10)

## 2018-09-16 LAB — ANA W/REFLEX: Anti Nuclear Antibody (ANA): POSITIVE — AB

## 2018-09-28 ENCOUNTER — Telehealth: Payer: Self-pay

## 2018-09-28 ENCOUNTER — Telehealth: Payer: Self-pay | Admitting: Allergy

## 2018-09-28 NOTE — Telephone Encounter (Signed)
Patient would like the results of his recent tests. He said he keeps getting alerts on his My Chart about them, but he cannot get into his My Chart and would like someone to read these to him.

## 2018-09-28 NOTE — Telephone Encounter (Signed)
Please discuss results regarding patient lab results.

## 2018-09-28 NOTE — Telephone Encounter (Signed)
Returned patient's call regarding lab results. Recommended returning for visit in 2-3 weeks for repeat labs and meet new PCP.

## 2018-09-28 NOTE — Telephone Encounter (Signed)
Requesting lab results. Please call back.  

## 2018-09-30 NOTE — Telephone Encounter (Signed)
Spoke with patient and reviewed results. Taking zyrtec daily with good benefit. HIV has been under control for the last few years.  Thyroid is being checked later this month.  Please place referral to rheumatology regarding the abnormal ANA and RNP ab and call patient with appointment time/date. He wants to stay within the [place] system.  Thank you.

## 2018-10-05 ENCOUNTER — Ambulatory Visit (INDEPENDENT_AMBULATORY_CARE_PROVIDER_SITE_OTHER): Payer: PRIVATE HEALTH INSURANCE | Admitting: Internal Medicine

## 2018-10-05 ENCOUNTER — Encounter: Payer: Self-pay | Admitting: Internal Medicine

## 2018-10-05 ENCOUNTER — Other Ambulatory Visit: Payer: Self-pay

## 2018-10-05 VITALS — BP 152/92 | HR 68 | Temp 98.0°F | Ht 68.0 in | Wt 182.6 lb

## 2018-10-05 DIAGNOSIS — I2510 Atherosclerotic heart disease of native coronary artery without angina pectoris: Secondary | ICD-10-CM | POA: Diagnosis not present

## 2018-10-05 DIAGNOSIS — Z955 Presence of coronary angioplasty implant and graft: Secondary | ICD-10-CM

## 2018-10-05 DIAGNOSIS — I10 Essential (primary) hypertension: Secondary | ICD-10-CM | POA: Diagnosis not present

## 2018-10-05 DIAGNOSIS — Z79899 Other long term (current) drug therapy: Secondary | ICD-10-CM

## 2018-10-05 DIAGNOSIS — I252 Old myocardial infarction: Secondary | ICD-10-CM | POA: Diagnosis not present

## 2018-10-05 MED ORDER — LISINOPRIL 20 MG PO TABS: 20.0000 mg | ORAL_TABLET | Freq: Every day | ORAL | 3 refills | Status: DC

## 2018-10-05 NOTE — Progress Notes (Signed)
   CC: Follow-up hypertension  HPI:  Mr.Jeremi H Bertini is a 63 y.o. very pleasant gentleman presenting to follow-up on hypertension.  Please see problem based charting for further details.  Past Medical History:  Diagnosis Date  . ALLERGIC RHINITIS   . Allergic sinusitis   . Anxiety state, unspecified   . CAD S/P percutaneous coronary angioplasty 08/08/2013   mRCA  Xience Alpine DES 2.75 mm x 33 mm (3.0 mm)  . Cerumen impaction, right   . DEPRESSION   . Diarrhea   . Erectile dysfunction   . EXTERNAL HEMORRHOIDS WITHOUT MENTION COMP   . GERD   . HIV DISEASE   . HIV infection ([place])   . HYPOGONADISM   . Non-ST elevated myocardial infarction (non-STEMI) ([place]) 08/08/2013   SubAcute100% RCA -- PCI; Echo: EF 55-605, Normal LV Size & Function - ~mild HK of Inferior Myocardium with mildly reduced RV function.  Normal Valve function  . Periodontal disease   . PERIPHERAL NEUROPATHY   . PHARYNGITIS, RECURRENT   . Pterygium of left eye   . Urinary frequency   . Urticaria   . VERTIGO    Review of Systems: As per HPI  Physical Exam:  Vitals:   10/05/18 1525 10/05/18 1531  BP: (!) 153/97 (!) 152/92  Pulse: 72 68  Temp: 98 F (36.7 C)   TempSrc: Oral   SpO2: 99%   Weight: 182 lb 9.6 oz (82.8 kg)   Height: 5\' 8"  (1.727 m)    Physical Exam  Constitutional: He is well-developed, well-nourished, and in no distress.  HENT:  Head: Normocephalic and atraumatic.  Eyes: Conjunctivae are normal.  Cardiovascular: Normal rate, regular rhythm and normal heart sounds.  No murmur heard. Pulmonary/Chest: Effort normal and breath sounds normal. He has no wheezes. He has no rales.    Assessment & Plan:   See Encounters Tab for problem based charting.  Patient discussed with Dr. Lynnae January

## 2018-10-05 NOTE — Assessment & Plan Note (Signed)
Hypertension: Albert Brown was recently started on amlodipine and states that he is now started taking the medication yet due to renal pollen its side effects.  He does have a history of coronary artery disease status post drug-eluting stent in 2015 following an NSTEMI.  He was tried on a low-dose beta-blocker however could not tolerate medication due to fatigue.  Per chart review, there were talks of initiating ACE inhibitor or ARB however at that point he had soft blood pressures and thus medication was not initiated.  BP Readings from Last 3 Encounters:  10/05/18 (!) 152/92  09/09/18 (!) 166/90  08/19/18 (!) 132/93   Plan: - Start lisinopril 20 mg daily -Discontinue amlodipine 2.5 mg daily

## 2018-10-05 NOTE — Patient Instructions (Signed)
Mr. Macbride,   It was a pleasure taking care of your the clinic today.  As we discussed, your blood pressure was high today which is in part due to not taking the medication.  I would like to start you on a medication called lisinopril as this will help protect your heart given your history of the heart attack.  Stop the amlodipine as soon as you pick up the new blood pressure medicine.  Take Care! Dr. Eileen Stanford  Please call the internal medicine center clinic if you have any questions or concerns, we may be able to help and keep you from a long and expensive emergency room wait. Our clinic and after hours phone number is [phone], the best time to call is Monday through Friday 9 am to 4 pm but there is always someone available 24/7 if you have an emergency. If you need medication refills please notify your pharmacy one week in advance and they will send [place] a request.

## 2018-10-06 NOTE — Telephone Encounter (Signed)
Patient has been placed to [place] [facility].   Thanks

## 2018-10-06 NOTE — Progress Notes (Signed)
Internal Medicine Clinic Attending  Case discussed with Dr. Eileen Stanford at the time of the visit.  We reviewed the resident's history and exam and pertinent patient test results.  I agree with the assessment, diagnosis, and plan of care documented in the resident's note.  Goal would be to also add BB due to CAD.

## 2018-10-14 ENCOUNTER — Ambulatory Visit: Payer: PRIVATE HEALTH INSURANCE | Admitting: Allergy

## 2018-10-29 ENCOUNTER — Encounter: Payer: Self-pay | Admitting: Infectious Diseases

## 2018-11-11 NOTE — Progress Notes (Deleted)
Office Visit Note  Patient: Albert Brown             Date of Birth: [birth year]           MRN: [id number]             PCP: Maudie Mercury, MD Referring: Garnet Sierras, DO Visit Date: 11/19/2018 Occupation: @GUAROCC @  Subjective:  No chief complaint on file.   History of Present Illness: Albert Brown is a 63 y.o. male ***   Activities of Daily Living:  Patient reports morning stiffness for *** {minute/hour:19697}.   Patient {ACTIONS;DENIES/REPORTS:21021675::"Denies"} nocturnal pain.  Difficulty dressing/grooming: {ACTIONS;DENIES/REPORTS:21021675::"Denies"} Difficulty climbing stairs: {ACTIONS;DENIES/REPORTS:21021675::"Denies"} Difficulty getting out of chair: {ACTIONS;DENIES/REPORTS:21021675::"Denies"} Difficulty using hands for taps, buttons, cutlery, and/or writing: {ACTIONS;DENIES/REPORTS:21021675::"Denies"}  No Rheumatology ROS completed.   PMFS History:  Patient Active Problem List   Diagnosis Date Noted  . Encounter for screening colonoscopy 08/24/2018  . Chronic urticaria 08/11/2018  . Dyspnea on exertion 08/01/2017  . Coronary artery disease involving native coronary artery of native heart without angina pectoris 08/01/2017  . Insomnia 04/30/2017  . Essential hypertension 09/27/2016  . Hepatitis B immune 07/31/2016  . Hyperlipidemia LDL goal <70 12/05/2013  . Chest pain, central 12/05/2013  . Dyslipidemia 08/10/2013  . NSTEMI (non-ST elevated myocardial infarction) ([place]) 08/08/2013    Class: Hospitalized for  . CAD S/P percutaneous coronary angioplasty - Xience outline DES to mid RCA (2.75 mm x 33 mm --> 3.0 mm) 08/08/2013    Class: History of  . Presence of drug coated stent in right coronary artery 08/08/2013  . Chest pain, unspecified 03/09/2013  . Erectile dysfunction 01/18/2013  . Pterygium of left eye 08/03/2012  . Cerumen impaction, right 07/25/2011  . Periodontal disease 08/21/2010  . Allergic sinusitis 08/21/2010  . Hypotestosteronism 09/20/2009   . Anxiety state 09/20/2009  . URINARY FREQUENCY 03/16/2009  . PHARYNGITIS, RECURRENT 10/06/2008  . EXTERNAL HEMORRHOIDS WITHOUT MENTION COMP 09/15/2008  . DIARRHEA 09/15/2008  . Human immunodeficiency virus (HIV) disease ([place]) 08/04/2006  . Depression 08/04/2006  . PERIPHERAL NEUROPATHY 08/04/2006  . ALLERGIC RHINITIS 08/04/2006  . GERD 08/04/2006  . VERTIGO 08/04/2006    Past Medical History:  Diagnosis Date  . ALLERGIC RHINITIS   . Allergic sinusitis   . Anxiety state, unspecified   . CAD S/P percutaneous coronary angioplasty 08/08/2013   mRCA  Xience Alpine DES 2.75 mm x 33 mm (3.0 mm)  . Cerumen impaction, right   . DEPRESSION   . Diarrhea   . Erectile dysfunction   . EXTERNAL HEMORRHOIDS WITHOUT MENTION COMP   . GERD   . HIV DISEASE   . HIV infection ([place])   . HYPOGONADISM   . Non-ST elevated myocardial infarction (non-STEMI) ([place]) 08/08/2013   SubAcute100% RCA -- PCI; Echo: EF 55-605, Normal LV Size & Function - ~mild HK of Inferior Myocardium with mildly reduced RV function.  Normal Valve function  . Periodontal disease   . PERIPHERAL NEUROPATHY   . PHARYNGITIS, RECURRENT   . Pterygium of left eye   . Urinary frequency   . Urticaria   . VERTIGO     Family History  Problem Relation Age of Onset  . Prostate cancer Father   . Osteosarcoma Father   . Prostate cancer Brother   . Prostate cancer Brother   . Cancer Brother   . Cancer Mother        brain tumor  . Rheum arthritis Mother    Past Surgical History:  Procedure Laterality  Date  . LEFT HEART CATHETERIZATION WITH CORONARY ANGIOGRAM N/A 08/08/2013   Procedure: LEFT HEART CATHETERIZATION WITH CORONARY ANGIOGRAM;  Surgeon: [name], MD;  Location: [facility] CATH LAB;  Service: Cardiovascular;  Laterality: N/A;  . PERCUTANEOUS CORONARY STENT INTERVENTION (PCI-S)  08/08/2013   mRCA 100% --> Xience Alpine DES 2.75 mm x 33 mm --> 3.0 mm   Social History   Social History Narrative  . Not on file    Immunization History  Administered Date(s) Administered  . Influenza Whole 04/05/2010  . Influenza,inj,Quad PF,6+ Mos 12/19/2015  . Pneumococcal Polysaccharide-23 11/26/2002, 07/25/2011  . Tdap 09/10/2011     Objective: Vital Signs: There were no vitals taken for this visit.   Physical Exam   Musculoskeletal Exam: ***  CDAI Exam: CDAI Score: - Patient Global: -; Provider Global: - Swollen: -; Tender: - Joint Exam   No joint exam has been documented for this visit   There is currently no information documented on the homunculus. Go to the Rheumatology activity and complete the homunculus joint exam.  Investigation: Findings:  09/09/18: ANA+, dsDNA 1, RNP 1.7, Smith-, Ro-, La-, C3 119, C4 12  Component     Latest Ref Rng & Units 09/09/2018  Beef (Bos spp) IgE     <0.35 kU/L <0.10  Class Interpretation      0  Lamb/Mutton (Ovis spp) IgE     <0.35 kU/L <0.10  Class Interpretation      0  Pork (Sus spp) IgE     <0.35 kU/L <0.10  Class Interpretation      0  Alpha Gal IgE*     <0.10 kU/L <0.10  dsDNA Ab     0 - 9 IU/mL 1  ENA RNP Ab     0.0 - 0.9 AI 1.7 (H)  ENA SM Ab Ser-aCnc     0.0 - 0.9 AI <0.2  ENA SSA (RO) Ab     0.0 - 0.9 AI <0.2  ENA SSB (LA) Ab     0.0 - 0.9 AI <0.2  SEE BELOW      Comment  Complement C3, Serum     82 - 167 mg/dL 119  Complement C4, Serum     14 - 44 mg/dL 12 (L)  Anti Nuclear Antibody (ANA)     Negative Positive (A)  Tryptase     2.2 - 13.2 ug/L 5.7  cu index     <10 40.7 (H)   Imaging: No results found.  Recent Labs: Lab Results  Component Value Date   WBC 6.4 08/19/2018   HGB 15.5 08/19/2018   PLT 187 08/19/2018   NA 146 (H) 09/09/2018   K 4.7 09/09/2018   CL 108 (H) 09/09/2018   CO2 23 09/09/2018   GLUCOSE 90 09/09/2018   BUN 19 09/09/2018   CREATININE 1.06 09/09/2018   BILITOT 0.3 09/09/2018   ALKPHOS 106 09/09/2018   AST 17 09/09/2018   ALT 19 09/09/2018   PROT 6.3 09/09/2018   ALBUMIN 4.4 09/09/2018    CALCIUM 9.2 09/09/2018   GFRAA 88 09/09/2018    Speciality Comments: No specialty comments available.  Procedures:  No procedures performed Allergies: Brilinta [ticagrelor], Codeine, Bactrim [sulfamethoxazole-trimethoprim], Ciprofloxacin, Dapsone, Pravastatin, and Sulfamethoxazole-trimethoprim   Assessment / Plan:     Visit Diagnoses: No diagnosis found.  Orders: No orders of the defined types were placed in this encounter.  No orders of the defined types were placed in this encounter.   Face-to-face time spent with  patient was *** minutes. Greater than 50% of time was spent in counseling and coordination of care.  Follow-Up Instructions: No follow-ups on file.   Ofilia Neas, PA-C  Note - This record has been created using Dragon software.  Chart creation errors have been sought, but may not always  have been located. Such creation errors do not reflect on  the standard of medical care.

## 2018-11-13 NOTE — Progress Notes (Signed)
Office Visit Note  Patient: Albert Brown             Date of Birth: [birth year]           MRN: [id number]             PCP: Maudie Mercury, MD Referring: Garnet Sierras, DO Visit Date: 11/17/2018 Occupation: Environmental health practitioner  Subjective:  Urticaria and positive ANA.   History of Present Illness: Albert Brown is a 63 y.o. male seen in consultation per request of Dr. Maudie Mercury.  According to patient in February he wore a new shirt to visit his brother and developed hives on his chest.  The hives eventually subsided and but recurred.  As his hives were present all the time he went to see his PCP and was prescribed Zyrtec and H2 blocker.  He states that he initially had no response to the therapy.  He was referred to Dr. Megan Salon who did thyroid testing and told him that he does not have any environmental allergies.  She also did some autoimmune lab work which came positive and was referred to me.  He states he has not had any episodes of hives in the last 2 months.  He states he experiences some paresthesias in his hands which could be carpal tunnel syndrome.  He has some discomfort in his left elbow which she dislocated at age 29 and have occasional discomfort.  He denies any joint pain otherwise.  There is no history of oral ulcers, nasal ulcers, malar rash, photosensitivity, shortness of breath, Raynauds phenomenon or joint swelling.  Patient gives history of chronic lower back pain.  He states he is seeing chiropractor in the past.  He has been very active and does yard work and rides bike.  Activities of Daily Living:  Patient reports morning stiffness for 0 minutes.   Patient Denies nocturnal pain.  Difficulty dressing/grooming: Reports Difficulty climbing stairs: Denies Difficulty getting out of chair: Reports Difficulty using hands for taps, buttons, cutlery, and/or writing: Denies  Review of Systems  Constitutional: Positive for fatigue. Negative for night sweats.  HENT: Negative for  mouth sores, mouth dryness and nose dryness.   Eyes: Negative for redness, itching and dryness.  Respiratory: Negative for shortness of breath, wheezing and difficulty breathing.   Cardiovascular: Negative for chest pain, palpitations, hypertension, irregular heartbeat and swelling in legs/feet.  Gastrointestinal: Positive for blood in stool. Negative for constipation and diarrhea.  Endocrine: Negative for increased urination.  Genitourinary: Negative for difficulty urinating and painful urination.  Musculoskeletal: Negative for arthralgias, joint pain, joint swelling, myalgias, muscle weakness, morning stiffness, muscle tenderness and myalgias.  Skin: Negative for color change, rash, hair loss, nodules/bumps, skin tightness, ulcers and sensitivity to sunlight.  Allergic/Immunologic: Negative for susceptible to infections.  Neurological: Positive for numbness. Negative for dizziness, fainting, light-headedness, headaches, memory loss, night sweats and weakness.  Hematological: Negative for bruising/bleeding tendency and swollen glands.  Psychiatric/Behavioral: Positive for depressed mood and sleep disturbance. Negative for confusion. The patient is nervous/anxious.     PMFS History:  Patient Active Problem List   Diagnosis Date Noted  . Encounter for screening colonoscopy 08/24/2018  . Chronic urticaria 08/11/2018  . Dyspnea on exertion 08/01/2017  . Coronary artery disease involving native coronary artery of native heart without angina pectoris 08/01/2017  . Insomnia 04/30/2017  . Essential hypertension 09/27/2016  . Hepatitis B immune 07/31/2016  . Hyperlipidemia LDL goal <70 12/05/2013  . Chest pain, central 12/05/2013  . Dyslipidemia  08/10/2013  . NSTEMI (non-ST elevated myocardial infarction) ([place]) 08/08/2013    Class: Hospitalized for  . CAD S/P percutaneous coronary angioplasty - Xience outline DES to mid RCA (2.75 mm x 33 mm --> 3.0 mm) 08/08/2013    Class: History of  .  Presence of drug coated stent in right coronary artery 08/08/2013  . Chest pain, unspecified 03/09/2013  . Erectile dysfunction 01/18/2013  . Pterygium of left eye 08/03/2012  . Cerumen impaction, right 07/25/2011  . Periodontal disease 08/21/2010  . Allergic sinusitis 08/21/2010  . Hypotestosteronism 09/20/2009  . Anxiety state 09/20/2009  . URINARY FREQUENCY 03/16/2009  . PHARYNGITIS, RECURRENT 10/06/2008  . EXTERNAL HEMORRHOIDS WITHOUT MENTION COMP 09/15/2008  . DIARRHEA 09/15/2008  . Human immunodeficiency virus (HIV) disease ([place]) 08/04/2006  . Depression 08/04/2006  . PERIPHERAL NEUROPATHY 08/04/2006  . ALLERGIC RHINITIS 08/04/2006  . GERD 08/04/2006  . VERTIGO 08/04/2006    Past Medical History:  Diagnosis Date  . ALLERGIC RHINITIS   . Allergic sinusitis   . Anxiety state, unspecified   . CAD S/P percutaneous coronary angioplasty 08/08/2013   mRCA  Xience Alpine DES 2.75 mm x 33 mm (3.0 mm)  . Cerumen impaction, right   . DEPRESSION   . Diarrhea   . Erectile dysfunction   . EXTERNAL HEMORRHOIDS WITHOUT MENTION COMP   . GERD   . HIV DISEASE   . HIV infection ([place])   . HYPOGONADISM   . Non-ST elevated myocardial infarction (non-STEMI) ([place]) 08/08/2013   SubAcute100% RCA -- PCI; Echo: EF 55-605, Normal LV Size & Function - ~mild HK of Inferior Myocardium with mildly reduced RV function.  Normal Valve function  . Periodontal disease   . PERIPHERAL NEUROPATHY   . PHARYNGITIS, RECURRENT   . Pterygium of left eye   . Urinary frequency   . Urticaria   . VERTIGO     Family History  Problem Relation Age of Onset  . Prostate cancer Father   . Osteosarcoma Father   . Prostate cancer Brother   . Cancer Mother        brain tumor   Past Surgical History:  Procedure Laterality Date  . LEFT HEART CATHETERIZATION WITH CORONARY ANGIOGRAM N/A 08/08/2013   Procedure: LEFT HEART CATHETERIZATION WITH CORONARY ANGIOGRAM;  Surgeon: [name], MD;  Location: [facility] CATH LAB;   Service: Cardiovascular;  Laterality: N/A;  . PERCUTANEOUS CORONARY STENT INTERVENTION (PCI-S)  08/08/2013   mRCA 100% --> Xience Alpine DES 2.75 mm x 33 mm --> 3.0 mm   Social History   Social History Narrative  . Not on file   Immunization History  Administered Date(s) Administered  . Influenza Whole 04/05/2010  . Influenza,inj,Quad PF,6+ Mos 12/19/2015  . Pneumococcal Polysaccharide-23 11/26/2002, 07/25/2011  . Tdap 09/10/2011     Objective: Vital Signs: BP 123/75 (BP Location: Right Arm, Patient Position: Sitting, Cuff Size: Normal)   Pulse 66   Resp 14   Ht 5\' 8"  (1.727 m)   Wt 177 lb (80.3 kg)   BMI 26.91 kg/m    Physical Exam Vitals signs and nursing note reviewed.  Constitutional:      Appearance: He is well-developed.  HENT:     Head: Normocephalic and atraumatic.  Eyes:     Conjunctiva/sclera: Conjunctivae normal.     Pupils: Pupils are equal, round, and reactive to light.  Neck:     Musculoskeletal: Normal range of motion and neck supple.  Cardiovascular:     Rate and Rhythm: Normal rate  and regular rhythm.     Heart sounds: Normal heart sounds.  Pulmonary:     Effort: Pulmonary effort is normal.     Breath sounds: Normal breath sounds.  Abdominal:     General: Bowel sounds are normal.     Palpations: Abdomen is soft.  Skin:    General: Skin is warm and dry.     Capillary Refill: Capillary refill takes less than 2 seconds.  Neurological:     Mental Status: He is alert and oriented to person, place, and time.  Psychiatric:        Behavior: Behavior normal.      Musculoskeletal Exam: C-spine, thoracic and lumbar spine with good range of motion.  He has some discomfort range of motion of his lumbar spine.  Shoulder joints, elbow joints, wrist joints with good range of motion.  He has DIP and PIP thickening with no synovitis.  Hip joints, knee joints, ankles, MTPs and PIPs been good range of motion with no synovitis.  CDAI Exam: CDAI Score: - Patient  Global: -; Provider Global: - Swollen: -; Tender: - Joint Exam   No joint exam has been documented for this visit   There is currently no information documented on the homunculus. Go to the Rheumatology activity and complete the homunculus joint exam.  Investigation: Findings:  09/09/18: ANA+, dsDNA 1, RNP 1.7, smith-, Ro-, La-, C3 119, C4 12  Component     Latest Ref Rng & Units 09/09/2018  Beef (Bos spp) IgE     <0.35 kU/L <0.10  Class Interpretation      0  Lamb/Mutton (Ovis spp) IgE     <0.35 kU/L <0.10  Class Interpretation      0  Pork (Sus spp) IgE     <0.35 kU/L <0.10  Class Interpretation      0  Alpha Gal IgE*     <0.10 kU/L <0.10  dsDNA Ab     0 - 9 IU/mL 1  ENA RNP Ab     0.0 - 0.9 AI 1.7 (H)  ENA SM Ab Ser-aCnc     0.0 - 0.9 AI <0.2  ENA SSA (RO) Ab     0.0 - 0.9 AI <0.2  ENA SSB (LA) Ab     0.0 - 0.9 AI <0.2  SEE BELOW      Comment  Complement C3, Serum     82 - 167 mg/dL 119  Complement C4, Serum     14 - 44 mg/dL 12 (L)  Anti Nuclear Antibody (ANA)     Negative Positive (A)  Tryptase     2.2 - 13.2 ug/L 5.7  cu index     <10 40.7 (H)   Imaging: No results found.  Recent Labs: Lab Results  Component Value Date   WBC 6.4 08/19/2018   HGB 15.5 08/19/2018   PLT 187 08/19/2018   NA 146 (H) 09/09/2018   K 4.7 09/09/2018   CL 108 (H) 09/09/2018   CO2 23 09/09/2018   GLUCOSE 90 09/09/2018   BUN 19 09/09/2018   CREATININE 1.06 09/09/2018   BILITOT 0.3 09/09/2018   ALKPHOS 106 09/09/2018   AST 17 09/09/2018   ALT 19 09/09/2018   PROT 6.3 09/09/2018   ALBUMIN 4.4 09/09/2018   CALCIUM 9.2 09/09/2018   GFRAA 88 09/09/2018    Speciality Comments: No specialty comments available.  Procedures:  No procedures performed Allergies: Brilinta [ticagrelor], Codeine, Bactrim [sulfamethoxazole-trimethoprim], Ciprofloxacin, Dapsone, Pravastatin, and Sulfamethoxazole-trimethoprim  Assessment / Plan:     Visit Diagnoses:  Chronic  urticaria-patient has been experiencing recurrent hives since February 2020.  He has been on Zyrtec and H2 blocker.  He stopped H2 blocker for the last few months.  He has had no recurrence of hives in the last 2 months.  It would be unusual for patient to have autoimmune hives which will resolve with Zyrtec.  Positive ANA-his ANA is positive but no titer is given.  He does not have any clinical features of a particular autoimmune disease on examination or history.  I will obtain [facility] labs in about 6 months.  I have also informed him about the clinical features of autoimmune disease.  If he develops any new symptoms he should notify me.  Positive Smith/RNP-he has no clinical features at this time.  He will notify me if he develops any new symptoms.  Paresthesia of both hands-he states he has intermittent tingling in his hands when he is on the computer or working for a long time.  He most likely has carpal tunnel syndrome.  I offered nerve conduction velocities but she declined.  I have advised him to use carpal tunnel brace and if his symptoms persist she should notify me.  Primary osteoarthritis of both hands-clinical findings are consistent with osteoarthritis.  He does not have any symptoms at this time.  Joint protection was discussed.  Chronic midline low back pain without sciatica-he gives history of longstanding lower back pain for which she has been seeing chiropractor in the past.  He has been having some lower back discomfort.  Have given him a handout for back exercises.  History of peripheral neuropathy-patient states he developed peripheral neuropathy with some antiviral medications which is not very prominent now.  Coronary artery disease status post angioplasty-followed by cardiology  Dyslipidemia-he is on a statins and coenzyme Q 10  Essential hypertension-he is on lisinopril which is a new treatment within the last month.  His blood pressure was in the normal limits.  Anxiety  and depression-he currently does not require any treatment.  Human immunodeficiency virus-he was diagnosed in 2003.  He is on antiviral therapy.  There has been no change in his treatment for several years.  He has been followed by ID.  This will be another challenging area in case we have to immunosuppressed him in the future.   Orders: No orders of the defined types were placed in this encounter.  No orders of the defined types were placed in this encounter.   Face-to-face time spent with patient was 45 minutes. Greater than 50% of time was spent in counseling and coordination of care.  Follow-Up Instructions: Return in about 6 months (around 05/17/2019) for Urticaria, positive ANA.   Bo Merino, MD  Note - This record has been created using Editor, commissioning.  Chart creation errors have been sought, but may not always  have been located. Such creation errors do not reflect on  the standard of medical care.

## 2018-11-17 ENCOUNTER — Other Ambulatory Visit: Payer: Self-pay

## 2018-11-17 ENCOUNTER — Ambulatory Visit (INDEPENDENT_AMBULATORY_CARE_PROVIDER_SITE_OTHER): Payer: PRIVATE HEALTH INSURANCE | Admitting: Rheumatology

## 2018-11-17 ENCOUNTER — Encounter: Payer: Self-pay | Admitting: Rheumatology

## 2018-11-17 VITALS — BP 123/75 | HR 66 | Resp 14 | Ht 68.0 in | Wt 177.0 lb

## 2018-11-17 DIAGNOSIS — I10 Essential (primary) hypertension: Secondary | ICD-10-CM

## 2018-11-17 DIAGNOSIS — M545 Low back pain, unspecified: Secondary | ICD-10-CM

## 2018-11-17 DIAGNOSIS — I2510 Atherosclerotic heart disease of native coronary artery without angina pectoris: Secondary | ICD-10-CM

## 2018-11-17 DIAGNOSIS — E785 Hyperlipidemia, unspecified: Secondary | ICD-10-CM

## 2018-11-17 DIAGNOSIS — R768 Other specified abnormal immunological findings in serum: Secondary | ICD-10-CM

## 2018-11-17 DIAGNOSIS — M19041 Primary osteoarthritis, right hand: Secondary | ICD-10-CM | POA: Diagnosis not present

## 2018-11-17 DIAGNOSIS — F32A Depression, unspecified: Secondary | ICD-10-CM

## 2018-11-17 DIAGNOSIS — B20 Human immunodeficiency virus [HIV] disease: Secondary | ICD-10-CM

## 2018-11-17 DIAGNOSIS — M19042 Primary osteoarthritis, left hand: Secondary | ICD-10-CM

## 2018-11-17 DIAGNOSIS — F419 Anxiety disorder, unspecified: Secondary | ICD-10-CM

## 2018-11-17 DIAGNOSIS — F329 Major depressive disorder, single episode, unspecified: Secondary | ICD-10-CM

## 2018-11-17 DIAGNOSIS — R202 Paresthesia of skin: Secondary | ICD-10-CM

## 2018-11-17 DIAGNOSIS — L508 Other urticaria: Secondary | ICD-10-CM | POA: Diagnosis not present

## 2018-11-17 DIAGNOSIS — I214 Non-ST elevation (NSTEMI) myocardial infarction: Secondary | ICD-10-CM

## 2018-11-17 DIAGNOSIS — Z9861 Coronary angioplasty status: Secondary | ICD-10-CM

## 2018-11-17 DIAGNOSIS — Z8669 Personal history of other diseases of the nervous system and sense organs: Secondary | ICD-10-CM

## 2018-11-17 DIAGNOSIS — G8929 Other chronic pain: Secondary | ICD-10-CM

## 2018-11-17 NOTE — Patient Instructions (Signed)

## 2018-11-19 ENCOUNTER — Ambulatory Visit: Payer: PRIVATE HEALTH INSURANCE | Admitting: Rheumatology

## 2018-12-08 ENCOUNTER — Other Ambulatory Visit: Payer: Self-pay

## 2018-12-08 DIAGNOSIS — B20 Human immunodeficiency virus [HIV] disease: Secondary | ICD-10-CM

## 2018-12-08 DIAGNOSIS — Z113 Encounter for screening for infections with a predominantly sexual mode of transmission: Secondary | ICD-10-CM

## 2018-12-09 ENCOUNTER — Other Ambulatory Visit: Payer: PRIVATE HEALTH INSURANCE

## 2018-12-09 ENCOUNTER — Other Ambulatory Visit: Payer: Self-pay

## 2018-12-09 ENCOUNTER — Other Ambulatory Visit (HOSPITAL_COMMUNITY)
Admission: RE | Admit: 2018-12-09 | Discharge: 2018-12-09 | Disposition: A | Discharge status: Home / Self Care | Payer: PRIVATE HEALTH INSURANCE | Source: Ambulatory Visit | Attending: Infectious Diseases | Admitting: Infectious Diseases

## 2018-12-09 DIAGNOSIS — Z113 Encounter for screening for infections with a predominantly sexual mode of transmission: Secondary | ICD-10-CM

## 2018-12-09 DIAGNOSIS — B20 Human immunodeficiency virus [HIV] disease: Secondary | ICD-10-CM

## 2018-12-10 ENCOUNTER — Other Ambulatory Visit: Payer: PRIVATE HEALTH INSURANCE

## 2018-12-10 LAB — T-HELPER CELLS (CD4) COUNT (NOT AT ARMC)
CD4 % Helper T Cell: 32 % — ABNORMAL LOW (ref 33–65)
CD4 T Cell Abs: 584 /uL (ref 400–1790)

## 2018-12-12 LAB — CBC WITH DIFFERENTIAL/PLATELET
Absolute Monocytes: 619 cells/uL (ref 200–950)
Basophils Absolute: 0 cells/uL (ref 0–200)
Basophils Relative: 0 %
Eosinophils Absolute: 238 cells/uL (ref 15–500)
Eosinophils Relative: 3.5 %
HCT: 46.2 % (ref 38.5–50.0)
Hemoglobin: 15.5 g/dL (ref 13.2–17.1)
Lymphs Abs: 1897 cells/uL (ref 850–3900)
MCH: 30.2 pg (ref 27.0–33.0)
MCHC: 33.5 g/dL (ref 32.0–36.0)
MCV: 90.1 fL (ref 80.0–100.0)
MPV: 11.3 fL (ref 7.5–12.5)
Monocytes Relative: 9.1 %
Neutro Abs: 4046 cells/uL (ref 1500–7800)
Neutrophils Relative %: 59.5 %
Platelets: 189 10*3/uL (ref 140–400)
RBC: 5.13 10*6/uL (ref 4.20–5.80)
RDW: 12.8 % (ref 11.0–15.0)
Total Lymphocyte: 27.9 %
WBC: 6.8 10*3/uL (ref 3.8–10.8)

## 2018-12-12 LAB — COMPREHENSIVE METABOLIC PANEL
AG Ratio: 2.1 (calc) (ref 1.0–2.5)
ALT: 29 U/L (ref 9–46)
AST: 22 U/L (ref 10–35)
Albumin: 4.4 g/dL (ref 3.6–5.1)
Alkaline phosphatase (APISO): 84 U/L (ref 35–144)
BUN: 19 mg/dL (ref 7–25)
CO2: 29 mmol/L (ref 20–32)
Calcium: 10 mg/dL (ref 8.6–10.3)
Chloride: 106 mmol/L (ref 98–110)
Creat: 1.15 mg/dL (ref 0.70–1.25)
Globulin: 2.1 g/dL (calc) (ref 1.9–3.7)
Glucose, Bld: 92 mg/dL (ref 65–99)
Potassium: 5 mmol/L (ref 3.5–5.3)
Sodium: 141 mmol/L (ref 135–146)
Total Bilirubin: 0.3 mg/dL (ref 0.2–1.2)
Total Protein: 6.5 g/dL (ref 6.1–8.1)

## 2018-12-12 LAB — HIV-1 RNA QUANT-NO REFLEX-BLD
HIV 1 RNA Quant: <20 copies/mL — AB
HIV-1 RNA Quant, Log: <1.3 Log copies/mL — AB

## 2018-12-12 LAB — RPR: RPR Ser Ql: NONREACTIVE

## 2018-12-14 LAB — URINE CYTOLOGY ANCILLARY ONLY
Chlamydia: NEGATIVE
Comment: NEGATIVE
Comment: NORMAL
Neisseria Gonorrhea: NEGATIVE

## 2018-12-16 ENCOUNTER — Ambulatory Visit: Payer: PRIVATE HEALTH INSURANCE | Admitting: Rheumatology

## 2018-12-17 ENCOUNTER — Other Ambulatory Visit: Payer: Self-pay | Admitting: Infectious Diseases

## 2018-12-17 DIAGNOSIS — B20 Human immunodeficiency virus [HIV] disease: Secondary | ICD-10-CM

## 2018-12-18 ENCOUNTER — Other Ambulatory Visit: Payer: Self-pay | Admitting: Infectious Diseases

## 2018-12-18 DIAGNOSIS — B20 Human immunodeficiency virus [HIV] disease: Secondary | ICD-10-CM

## 2018-12-24 ENCOUNTER — Ambulatory Visit: Payer: PRIVATE HEALTH INSURANCE | Admitting: Infectious Diseases

## 2018-12-29 ENCOUNTER — Ambulatory Visit (INDEPENDENT_AMBULATORY_CARE_PROVIDER_SITE_OTHER): Payer: PRIVATE HEALTH INSURANCE | Admitting: Infectious Diseases

## 2018-12-29 ENCOUNTER — Other Ambulatory Visit: Payer: Self-pay

## 2018-12-29 VITALS — BP 136/89 | HR 82 | Temp 98.2°F | Wt 175.0 lb

## 2018-12-29 DIAGNOSIS — Z113 Encounter for screening for infections with a predominantly sexual mode of transmission: Secondary | ICD-10-CM | POA: Diagnosis not present

## 2018-12-29 DIAGNOSIS — Z79899 Other long term (current) drug therapy: Secondary | ICD-10-CM

## 2018-12-29 DIAGNOSIS — I214 Non-ST elevation (NSTEMI) myocardial infarction: Secondary | ICD-10-CM

## 2018-12-29 DIAGNOSIS — B20 Human immunodeficiency virus [HIV] disease: Secondary | ICD-10-CM

## 2018-12-29 DIAGNOSIS — M2021 Hallux rigidus, right foot: Secondary | ICD-10-CM

## 2018-12-29 DIAGNOSIS — Z23 Encounter for immunization: Secondary | ICD-10-CM | POA: Diagnosis not present

## 2018-12-29 NOTE — Addendum Note (Signed)
Addended by: Eunie Lawn C on: 12/29/2018 03:38 PM   Modules accepted: Orders

## 2018-12-29 NOTE — Assessment & Plan Note (Signed)
Doing well  Will continue odefsy, not clear that this is cause of rash.  Flu vax.  PCV 13 at next visit.  Offered/refused condoms.  Refer for colon.  rtc in 9 months.

## 2018-12-29 NOTE — Assessment & Plan Note (Addendum)
We will work to find cardiologist.

## 2018-12-29 NOTE — Progress Notes (Signed)
   Subjective:    Patient ID: Albert Brown, male    DOB: 1955/03/27, 63 y.o.   MRN: [id number]  HPI 63yo M with HIV+ (on triumeq -->complera-->odefsy)  He [facility]2015andwas found to have acute total occlusion of the mid RCA. This was successfully treated with PCI plus a drug-eluting stent. He had well-preserved left ventricular systolic function (EF [id number]). There was mild basilar to mid inferior hypokinesis.   He was seen in ID on 07-2018 for urticaria. He was referred to allergy (no environmental allergies) and then to rheum ("waste of time and money"). Has improved since he has been on zyrtec and singulair (singulair did not help) since February.  Takes zyrtec qod after getting under control qday. Missed a few days and felt a "little itchy" but did not break out.  Has cats.  Has been anxious, fatigued.   HIV 1 RNA Quant (copies/mL)  Date Value  12/09/2018 <20 DETECTED (A)  06/02/2018 <20 NOT DETECTED  10/28/2017 <20 NOT DETECTED   CD4 T Cell Abs (/uL)  Date Value  12/09/2018 584  06/02/2018 520  10/28/2017 430    Review of Systems  Constitutional: Positive for fatigue. Negative for appetite change and unexpected weight change.  Respiratory: Negative for cough and shortness of breath.   Cardiovascular: Positive for chest pain.  Gastrointestinal: Negative for constipation and diarrhea.  Genitourinary: Negative for difficulty urinating.  Musculoskeletal:       Wants to see podiatry for shortening of R 1st MTP. Believes is dislocated.   Skin: Negative for rash.  Psychiatric/Behavioral: The patient is nervous/anxious.   attributes CP to stress, muscles.  CV is out of his network. Has PCP at [facility].  Please see HPI. All other systems reviewed and negative.     Objective:   Physical Exam Constitutional:      Appearance: Normal appearance.  HENT:     Mouth/Throat:     Mouth: Mucous membranes are moist.     Pharynx: No oropharyngeal  exudate.  Eyes:     Extraocular Movements: Extraocular movements intact.     Pupils: Pupils are equal, round, and reactive to light.  Neck:     Musculoskeletal: Normal range of motion and neck supple.  Cardiovascular:     Rate and Rhythm: Normal rate and regular rhythm.  Pulmonary:     Effort: Pulmonary effort is normal.     Breath sounds: Normal breath sounds.  Abdominal:     General: Bowel sounds are normal. There is no distension.     Palpations: Abdomen is soft.  Musculoskeletal:     Right lower leg: No edema.     Left lower leg: No edema.       Feet:  Neurological:     General: No focal deficit present.     Mental Status: He is alert.  Psychiatric:        Mood and Affect: Mood normal.           Assessment & Plan:

## 2019-01-06 ENCOUNTER — Other Ambulatory Visit: Payer: Self-pay | Admitting: Internal Medicine

## 2019-01-06 DIAGNOSIS — Z9861 Coronary angioplasty status: Secondary | ICD-10-CM

## 2019-01-06 DIAGNOSIS — I2510 Atherosclerotic heart disease of native coronary artery without angina pectoris: Secondary | ICD-10-CM

## 2019-01-06 DIAGNOSIS — E785 Hyperlipidemia, unspecified: Secondary | ICD-10-CM

## 2019-01-06 NOTE — Telephone Encounter (Signed)
Please schedule overdue F/U with Dr. Saunders Revel. Thank you!

## 2019-01-07 ENCOUNTER — Other Ambulatory Visit: Payer: Self-pay

## 2019-01-07 ENCOUNTER — Encounter: Payer: Self-pay | Admitting: Podiatry

## 2019-01-07 ENCOUNTER — Ambulatory Visit: Payer: PRIVATE HEALTH INSURANCE | Admitting: Podiatry

## 2019-01-07 ENCOUNTER — Ambulatory Visit (INDEPENDENT_AMBULATORY_CARE_PROVIDER_SITE_OTHER): Payer: PRIVATE HEALTH INSURANCE

## 2019-01-07 VITALS — BP 147/76 | HR 70 | Resp 16

## 2019-01-07 DIAGNOSIS — M778 Other enthesopathies, not elsewhere classified: Secondary | ICD-10-CM

## 2019-01-07 NOTE — Patient Instructions (Signed)
Pre-Operative Instructions  Congratulations, you have decided to take an important step towards improving your quality of life.  You can be assured that the doctors and staff at [facility] will be with you every step of the way.  Here are some important things you should know:  1. Plan to be at the surgery center/hospital at least 1 (one) hour prior to your scheduled time, unless otherwise directed by the surgical center/hospital staff.  You must have a responsible adult accompany you, remain during the surgery and drive you home.  Make sure you have directions to the surgical center/hospital to ensure you arrive on time. 2. If you are having surgery at [facility] or  hospitals, you will need a copy of your medical history and physical form from your family physician within one month prior to the date of surgery. We will give you a form for your primary physician to complete.  3. We make every effort to accommodate the date you request for surgery.  However, there are times where surgery dates or times have to be moved.  We will contact you as soon as possible if a change in schedule is required.   4. No aspirin/ibuprofen for one week before surgery.  If you are on aspirin, any non-steroidal anti-inflammatory medications (Mobic, Aleve, Ibuprofen) should not be taken seven (7) days prior to your surgery.  You make take Tylenol for pain prior to surgery.  5. Medications - If you are taking daily heart and blood pressure medications, seizure, reflux, allergy, asthma, anxiety, pain or diabetes medications, make sure you notify the surgery center/hospital before the day of surgery so they can tell you which medications you should take or avoid the day of surgery. 6. No food or drink after midnight the night before surgery unless directed otherwise by surgical center/hospital staff. 7. No alcoholic beverages 24-hours prior to surgery.  No smoking 24-hours prior or 24-hours after  surgery. 8. Wear loose pants or shorts. They should be loose enough to fit over bandages, boots, and casts. 9. Don't wear slip-on shoes. Sneakers are preferred. 10. Bring your boot with you to the surgery center/hospital.  Also bring crutches or a walker if your physician has prescribed it for you.  If you do not have this equipment, it will be provided for you after surgery. 11. If you have not been contacted by the surgery center/hospital by the day before your surgery, call to confirm the date and time of your surgery. 12. Leave-time from work may vary depending on the type of surgery you have.  Appropriate arrangements should be made prior to surgery with your employer. 13. Prescriptions will be provided immediately following surgery by your doctor.  Fill these as soon as possible after surgery and take the medication as directed. Pain medications will not be refilled on weekends and must be approved by the doctor. 14. Remove nail polish on the operative foot and avoid getting pedicures prior to surgery. 15. Wash the night before surgery.  The night before surgery wash the foot and leg well with water and the antibacterial soap provided. Be sure to pay special attention to beneath the toenails and in between the toes.  Wash for at least three (3) minutes. Rinse thoroughly with water and dry well with a towel.  Perform this wash unless told not to do so by your physician.  Enclosed: 1 Ice pack (please put in freezer the night before surgery)   1 Hibiclens skin cleaner     Pre-op instructions  If you have any questions regarding the instructions, please do not hesitate to call our office.  [place]: [address], [place], [place] [address] -- [phone]  [place]: [address] [place], [place] [address] -- [phone]  [place]: [address]  [place],  [address] -- [phone]   Website: [email or web address] 

## 2019-01-12 ENCOUNTER — Telehealth: Payer: Self-pay | Admitting: Podiatry

## 2019-01-12 NOTE — Progress Notes (Signed)
Subjective:   Patient ID: Albert Brown, male   DOB: 63 y.o.   MRN: [id number]   HPI Patient states he has had terrible problems with the big toe joint of his right foot and states that he has trouble walking and he knows that he is can need surgery he has been holding off but he has an opportunity to get it done now.  States is been going on for years and gradually getting worse over that period of time.  Patient does not smoke likes to be active   Review of Systems  All other systems reviewed and are negative.       Objective:  Physical Exam Vitals signs and nursing note reviewed.  Constitutional:      Appearance: He is well-developed.  Pulmonary:     Effort: Pulmonary effort is normal.  Musculoskeletal: Normal range of motion.  Skin:    General: Skin is warm.  Neurological:     Mental Status: He is alert.     Neurovascular status intact muscle strength was found to be adequate range of motion within normal limits.  Patient is noted to have a very painful right first MPJ with diminished range of motion and large bone spur formation.  Patient is found to have good digital perfusion well oriented x3 and has pain on movement and pressure around the first metatarsal head     Assessment:  Hallux limitus rigidus deformity right with bone spur formation that is painful when pressed and make shoe gear difficult     Plan:  H&P reviewed the chronic nature of this condition and the damage to the joint surface.  Educated him on this with x-rays and discuss treatment options and he wants surgery as he is been thinking about it for a long time.  I discussed possibility for distal osteotomy versus implant and I explained this to him and allowed him to read consent form going over surgery and complications and alternative treatments.  After extensive review he signed consent form understands total recovery can take 6 months to 1 year and may require revisional surgery at 1 point in the future.   Patient is scheduled for outpatient surgery is encouraged to call with questions prior to procedure and had air fracture walker dispensed with all instructions on usage  X-rays indicated significant arthritis of the right first MPJ with bone spur formation and elevation of the joint

## 2019-01-12 NOTE — Telephone Encounter (Addendum)
DOS: 01/26/2019  SURGICAL PROCEDURE: Altamese [place] Bi-Planar [id number]) or Vilinda Blanks Implant 1st MPJ [phone])  [employer] Insurance:  Deductible is $3,250 and $3,250 has been met. CoInsurance is 100% Per  Patient was told he would be responsible for 40% of CoInsurance and Insurance would pay 60%. Out of Pocket is $6,500 and $6,500 has been met.  No prior authorization or referrals are required. Call reference# [id number].

## 2019-01-12 NOTE — Telephone Encounter (Signed)
lmov to schedule  °

## 2019-01-14 ENCOUNTER — Telehealth: Payer: Self-pay | Admitting: Podiatry

## 2019-01-14 NOTE — Telephone Encounter (Signed)
Pt called to cancel surgery. Stated his insurance stated he would be responsible of 40% of his co-insurance that they would only pay 60%. Pt stated he would not be able to afford his portion even with a payment plan. I told the pt I would contact the surgical center to let them know as well. Told pt to call us if he needed any conservative treatment. I have canceled pt's surgery on Dr. Mellody Drown schedule in both the book and on his schedule in Epic. I've also cancelled his postop visits and contacted Caren Griffins at [facility] to cancel his surgery with them as well.

## 2019-01-14 NOTE — Telephone Encounter (Signed)
Called and spoke to [place] with pt's insurance [employer] of [place]. She stated she didn't know who pt spoke to but that he has met both his deductible and out of pocket maximum and would only be responsible for a $20 co-pay and the rest would be covered at 100%. Reference# [id number].

## 2019-01-14 NOTE — Telephone Encounter (Signed)
Pt stated he spoke to someone with [employer] his insurance and they told him they would only pay 60% even though he has met is deductible and out of pocket maximum. I told the pt I would call them and see what I can find out and then call him back.

## 2019-01-14 NOTE — Telephone Encounter (Signed)
Called pt to let him know I spoke to the same the same representative I spoke to the other day. Stated I was told again since he met his deducible and out of pocket maximum he would only pay a $20 co-pay at the facility and the rest would be covered at 100%. Pt was nervous and I told him and gave him the name and reference number as well that if they came back, we would be able to refute and say we were told this information. Pt decided to go ahead with the surgery. I contacted Caren Griffins at [facility] and told her to keep the pt on the books for surgery on 12/01 with Dr. Paulla Dolly.

## 2019-01-14 NOTE — Telephone Encounter (Signed)
DOS: 01/26/2019  SURGICAL PROCEDURE: Altamese [place] Bi-Planar [id number]) or Vilinda Blanks Implant 1st MPJ [id number])  [employer] Effective Date: 04/26/2018 -   DEDUCTIBLE is $3,250 with $3,250 met and zero remaining.  OUT OF POCKET MAXIMUM $6,500 with $6,500 met and zero remaining.  COINSURANCE IS 100%  Pt is to pay a $20 co-pay at surgical facility  [facility] Call Reference # [id number].

## 2019-01-25 MED ORDER — PROMETHAZINE HCL 25 MG PO TABS: 25.0000 mg | ORAL_TABLET | Freq: Three times a day (TID) | ORAL | 0 refills | Status: DC | PRN

## 2019-01-25 MED ORDER — MEPERIDINE HCL 50 MG PO TABS: 50.0000 mg | ORAL_TABLET | ORAL | Status: DC | PRN

## 2019-01-25 NOTE — Addendum Note (Signed)
Addended by: Wallene Huh on: 01/25/2019 05:02 PM   Modules accepted: Orders

## 2019-01-26 ENCOUNTER — Other Ambulatory Visit: Payer: Self-pay | Admitting: Podiatry

## 2019-01-26 ENCOUNTER — Telehealth: Payer: Self-pay | Admitting: *Deleted

## 2019-01-26 ENCOUNTER — Encounter: Payer: Self-pay | Admitting: Podiatry

## 2019-01-26 DIAGNOSIS — M2021 Hallux rigidus, right foot: Secondary | ICD-10-CM

## 2019-01-26 MED ORDER — MEPERIDINE HCL 50 MG PO TABS: 50.0000 mg | ORAL_TABLET | ORAL | 0 refills | Status: DC | PRN

## 2019-01-26 NOTE — Telephone Encounter (Signed)
Pt called, asked if a pain medication was to be called in for him.

## 2019-01-26 NOTE — Telephone Encounter (Signed)
Dr. Jacqualyn Posey reviewed Meds & Orders and the Demerol had been put in as a clinic administered medication. Dr. Jacqualyn Posey reorder and sent to the [facility]. I informed pt of the orders.

## 2019-01-26 NOTE — Progress Notes (Signed)
Resent pain medication for Dr. Paulla Dolly

## 2019-01-26 NOTE — Telephone Encounter (Signed)
Pt states he would need a note to return to work after evaluation 02/17/2019, he is not insured through his employer. Pt states he has one more week of vacation and will need to be extended 2 more weeks. I told pt I could write a note stating he is to be out of work until reevaluated 02/17/2019. Pt agreed, would like faxed to [phone] Attn:  Herbie Baltimore or Lattie Haw, and email to [email or web address]. Letter sent to fax and email as requested by pt.

## 2019-01-26 NOTE — Telephone Encounter (Signed)
Dr. Paulla Dolly states he called in Demerol for the pt yesterday.

## 2019-01-26 NOTE — Telephone Encounter (Signed)
Pt states he had surgery with Dr. Paulla Dolly this morning and is on vacation, but will need a note for work to extend his leave.

## 2019-01-27 ENCOUNTER — Other Ambulatory Visit: Payer: Self-pay | Admitting: Podiatry

## 2019-01-27 ENCOUNTER — Telehealth: Payer: Self-pay | Admitting: *Deleted

## 2019-01-27 MED ORDER — HYDROMORPHONE HCL 4 MG PO TABS: 4.0000 mg | ORAL_TABLET | ORAL | 0 refills | Status: AC | PRN

## 2019-01-27 NOTE — Telephone Encounter (Signed)
Pt states his pharmacy does have the Demerol. Dr. Paulla Dolly ordered Dilaudid with the [facility]. I informed pt of the change of orders and to go to the ED if any symptoms of shortness of breath or difficulty breathing, swelling of the mouth or face as with any medication or narcotic and if he had ibuprofen and the tolerated he could take 800mg  3 times a day in between the doses of the pain medication. Pt states understanding.

## 2019-01-28 ENCOUNTER — Ambulatory Visit: Payer: PRIVATE HEALTH INSURANCE | Admitting: Interventional Cardiology

## 2019-02-04 ENCOUNTER — Other Ambulatory Visit: Payer: Self-pay

## 2019-02-04 ENCOUNTER — Ambulatory Visit (INDEPENDENT_AMBULATORY_CARE_PROVIDER_SITE_OTHER): Payer: PRIVATE HEALTH INSURANCE | Admitting: Podiatry

## 2019-02-04 ENCOUNTER — Encounter: Payer: PRIVATE HEALTH INSURANCE | Admitting: Podiatry

## 2019-02-04 ENCOUNTER — Ambulatory Visit (INDEPENDENT_AMBULATORY_CARE_PROVIDER_SITE_OTHER): Payer: PRIVATE HEALTH INSURANCE

## 2019-02-04 DIAGNOSIS — M21611 Bunion of right foot: Secondary | ICD-10-CM

## 2019-02-04 DIAGNOSIS — M778 Other enthesopathies, not elsewhere classified: Secondary | ICD-10-CM

## 2019-02-05 ENCOUNTER — Other Ambulatory Visit: Payer: Self-pay | Admitting: *Deleted

## 2019-02-05 ENCOUNTER — Telehealth: Payer: Self-pay

## 2019-02-05 DIAGNOSIS — Z9861 Coronary angioplasty status: Secondary | ICD-10-CM

## 2019-02-05 DIAGNOSIS — I10 Essential (primary) hypertension: Secondary | ICD-10-CM

## 2019-02-05 DIAGNOSIS — E785 Hyperlipidemia, unspecified: Secondary | ICD-10-CM

## 2019-02-05 DIAGNOSIS — I2510 Atherosclerotic heart disease of native coronary artery without angina pectoris: Secondary | ICD-10-CM

## 2019-02-05 MED ORDER — LISINOPRIL 20 MG PO TABS: 20.0000 mg | ORAL_TABLET | Freq: Every day | ORAL | 3 refills | Status: DC

## 2019-02-05 MED ORDER — SIMVASTATIN 20 MG PO TABS: ORAL_TABLET | ORAL | 0 refills | Status: DC

## 2019-02-05 NOTE — Telephone Encounter (Signed)
Requested Prescriptions   Signed Prescriptions Disp Refills  . simvastatin (ZOCOR) 20 MG tablet 30 tablet 0    Sig: TAKE 1 TABLET BY MOUTH DAILY AT 6 PM    Authorizing Provider: END, CHRISTOPHER    Ordering User: NEWCOMER MCCLAIN, Albert Brown

## 2019-02-05 NOTE — Progress Notes (Signed)
Subjective:   Patient ID: Albert Brown, male   DOB: 63 y.o.   MRN: [id number]   HPI Patient states doing well with my right foot with mild discomfort and I was not able to get my pain medication but I did do okay with it   ROS      Objective:  Physical Exam  Neurovascular status intact with patient's right first MPJ healing well wound edges well coapted reasonable range of motion with moderate amount of dorsal restriction     Assessment:  Overall doing well with surgery of the right foot with limited motion which is to be expected and good healing occurring     Plan:  H&P condition reviewed and at this point I have recommended physical therapy to try to improve motion.  I did discuss continued immobilization elevation and I reapplied sterile dressing and patient will be seen back in several weeks and is encouraged to call with questions concerns  X-rays indicate that the osteotomy is healing well fixation in place and joint looks relatively healthy

## 2019-02-10 ENCOUNTER — Telehealth: Payer: Self-pay | Admitting: *Deleted

## 2019-02-10 DIAGNOSIS — M21611 Bunion of right foot: Secondary | ICD-10-CM

## 2019-02-10 DIAGNOSIS — M778 Other enthesopathies, not elsewhere classified: Secondary | ICD-10-CM

## 2019-02-10 NOTE — Telephone Encounter (Signed)
Faxed referral to [facility]. 

## 2019-02-10 NOTE — Telephone Encounter (Signed)
[facility] - In-office states they are not in-network with pt's insurance.

## 2019-02-11 ENCOUNTER — Other Ambulatory Visit: Payer: Self-pay

## 2019-02-11 ENCOUNTER — Ambulatory Visit: Payer: PRIVATE HEALTH INSURANCE | Attending: Podiatry | Admitting: Physical Therapy

## 2019-02-11 DIAGNOSIS — M25571 Pain in right ankle and joints of right foot: Secondary | ICD-10-CM | POA: Diagnosis not present

## 2019-02-11 DIAGNOSIS — R2689 Other abnormalities of gait and mobility: Secondary | ICD-10-CM | POA: Insufficient documentation

## 2019-02-12 ENCOUNTER — Encounter: Payer: Self-pay | Admitting: Physical Therapy

## 2019-02-12 NOTE — Therapy (Addendum)
[place], [place], [address] Phone: [phone]   Fax:  [phone]  Physical Therapy Evaluation/Discharge   Patient Details  Name: Albert Brown MRN: [id number] Date of Birth: [birth year] Referring Provider (PT): Dr Ila Mcgill    Encounter Date: 02/11/2019  PT End of Session - 02/11/19 1559    Visit Number  1    Number of Visits  6    Date for PT Re-Evaluation  03/25/19    PT Start Time  1326    PT Stop Time  1412    PT Time Calculation (min)  46 min    Activity Tolerance  Patient tolerated treatment well    Behavior During Therapy  [facility] for tasks assessed/performed       Past Medical History:  Diagnosis Date  . ALLERGIC RHINITIS   . Allergic sinusitis   . Anxiety state, unspecified   . CAD S/P percutaneous coronary angioplasty 08/08/2013   mRCA  Xience Alpine DES 2.75 mm x 33 mm (3.0 mm)  . Cerumen impaction, right   . DEPRESSION   . Diarrhea   . Erectile dysfunction   . EXTERNAL HEMORRHOIDS WITHOUT MENTION COMP   . GERD   . HIV DISEASE   . HIV infection ([place])   . HYPOGONADISM   . Non-ST elevated myocardial infarction (non-STEMI) ([place]) 08/08/2013   SubAcute100% RCA -- PCI; Echo: EF 55-605, Normal LV Size & Function - ~mild HK of Inferior Myocardium with mildly reduced RV function.  Normal Valve function  . Periodontal disease   . PERIPHERAL NEUROPATHY   . PHARYNGITIS, RECURRENT   . Pterygium of left eye   . Urinary frequency   . Urticaria   . VERTIGO     Past Surgical History:  Procedure Laterality Date  . LEFT HEART CATHETERIZATION WITH CORONARY ANGIOGRAM N/A 08/08/2013   Procedure: LEFT HEART CATHETERIZATION WITH CORONARY ANGIOGRAM;  Surgeon: [name], MD;  Location: [facility] CATH LAB;  Service: Cardiovascular;  Laterality: N/A;  . PERCUTANEOUS CORONARY STENT INTERVENTION (PCI-S)  08/08/2013   mRCA 100% --> Xience Alpine DES 2.75 mm x 33 mm --> 3.0 mm    There were no vitals  filed for this visit.   Subjective Assessment - 02/11/19 1333    Subjective  Patient had a long history of right foot pain that increased over time. It was found that he had a bunion and bone spur on the right side. He had a bunionectomy and bone spur removal on 01/26/2019. He is a surgical shoe at this time. He has a boot but is weaning out.    How long can you stand comfortably?  pain increase the more he walks    How long can you walk comfortably?  limited community distances    Patient Stated Goals  to be able to mow his lawn again    Currently in Pain?  Yes    Pain Score  4     Pain Location  Foot    Pain Orientation  Right    Pain Descriptors / Indicators  Aching    Pain Type  Surgical pain    Pain Radiating Towards  nothing    Pain Onset  1 to 4 weeks ago    Pain Frequency  Occasional    Aggravating Factors   standing and walking    Pain Relieving Factors  rest         [facility] PT Assessment - 02/12/19 0001  Assessment   Medical Diagnosis  Right Foot Osteotomy     Referring Provider (PT)  Dr Ila Mcgill     Onset Date/Surgical Date  01/26/19    Hand Dominance  Right    Next MD Visit  Next week     Prior Therapy  None       Precautions   Precautions  None      Restrictions   Weight Bearing Restrictions  No      Balance Screen   Has the patient fallen in the past 6 months  No    Has the patient had a decrease in activity level because of a fear of falling?   No    Is the patient reluctant to leave their home because of a fear of falling?   No      Home Environment   Additional Comments  No steps into his house       Prior Function   Level of Independence  Independent    Vocation  Full time employment    Vocation Requirements  Works as a Pharmacist, hospital     Leisure  riding his bike, walking for exercises       Cognition   Overall Cognitive Status  Within Functional Limits for tasks assessed    Attention  Focused    Focused Attention  Appears intact     Memory  Appears intact    Awareness  Appears intact    Problem Solving  Appears intact      Observation/Other Assessments   Skin Integrity  well healing surgical wound       Sensation   Light Touch  Appears Intact      Coordination   Gross Motor Movements are Fluid and Coordinated  Yes    Fine Motor Movements are Fluid and Coordinated  Yes      AROM   Overall AROM Comments  limited active motion of big toe. Per patient had no movement prior to surgery     Right Ankle Dorsiflexion  0    Right Ankle Plantar Flexion  --   WNL   Right Ankle Inversion  --   WNL   Right Ankle Eversion  --   WNL     PROM   Overall PROM Comments  limtied flexion and extension of the great toe      Strength   Right/Left Ankle  Right    Right Ankle Dorsiflexion  4+/5    Right Ankle Plantar Flexion  4+/5    Right Ankle Inversion  4/5    Right Ankle Eversion  5/5      Palpation   Palpation comment  no unexpected tenderness to palpation       Ambulation/Gait   Gait Comments  decreased weight bearing on the right side; Walking in surgical shoe.                 Objective measurements completed on examination: See above findings.      [facility] Adult PT Treatment/Exercise - 02/12/19 0001      Ankle Exercises: Stretches   Other Stretch  showed great toe stretching for MTP and DTP joint Patient advised to not be overly agressive. His foot is still healing       Ankle Exercises: Supine   Other Supine Ankle Exercises  ankle 4 way AROM              PT Education - 02/12/19 1002    Education Details  reviewed symptom management and activity progression    Person(s) Educated  Patient    Methods  Explanation;Demonstration;Tactile cues;Verbal cues;Handout    Comprehension  Verbalized understanding;Returned demonstration;Verbal cues required;Tactile cues required       PT Short Term Goals - 02/11/19 1620      PT SHORT TERM GOAL #1   Title  Patient will be independent with basic HEP     Time  3    Period  Weeks    Status  New    Target Date  03/04/19        PT Long Term Goals - 02/11/19 1621      PT LONG TERM GOAL #1   Title  Patient will stand for 1 hour out of surgial shoe without swelling in pain in order to return to work    Time  6    Period  Weeks    Status  New    Target Date  03/25/19      PT LONG TERM GOAL #2   Title  Patient will ambualte 1 mile without increased pain and stiffeness    Time  6    Period  Weeks    Status  New    Target Date  03/25/19      PT LONG TERM GOAL #3   Title  Patient will demonstrate [facility] great toe flexion and extension in order to perfrom ADL's without pain    Time  6    Period  Weeks    Status  New    Target Date  03/25/19             Plan - 02/11/19 1559    Clinical Impression Statement  Patient is a 63 year old male S/P right great toe osteotomy. At this time he is doing very well. He has mild limitations in ankle motion and great toe mobility. He has minor focal swelling but expected swelling given he is 2 weeks post. He would benefit from skilled therapy to progress out of the surgical shoe and to increase great toe and ankle ROM.    Personal Factors and Comorbidities  Comorbidity 1;Comorbidity 2    Comorbidities  anxiety, low back pain    Examination-Activity Limitations  Stand;Stairs;Squat    Examination-Participation Restrictions  Shop;Community Activity;Driving    Stability/Clinical Decision Making  Stable/Uncomplicated    Clinical Decision Making  Low    Rehab Potential  Good    PT Frequency  1x / week    PT Duration  6 weeks    PT Treatment/Interventions  ADLs/Self Care Home Management;Therapeutic activities;Therapeutic exercise;Balance training;Neuromuscular re-education;Gait training;Stair training;Manual techniques;Passive range of motion;Taping    PT Next Visit Plan  continue with great toe mobility.; give resitance for ankle exercises, progress to standing acitivty when able    PT Home Exercise  Plan  ankle 4 way t-band; great toe mobilization    Consulted and Agree with Plan of Care  Patient       Patient will benefit from skilled therapeutic intervention in order to improve the following deficits and impairments:  Abnormal gait, Difficulty walking, Pain, Decreased activity tolerance, Decreased range of motion  Visit Diagnosis: Pain in right ankle and joints of right foot  Other abnormalities of gait and mobility     Problem List Patient Active Problem List   Diagnosis Date Noted  . Encounter for screening colonoscopy 08/24/2018  . Chronic urticaria 08/11/2018  . Dyspnea on exertion 08/01/2017  . Coronary artery disease involving native  coronary artery of native heart without angina pectoris 08/01/2017  . Insomnia 04/30/2017  . Essential hypertension 09/27/2016  . Hepatitis B immune 07/31/2016  . Hyperlipidemia LDL goal <70 12/05/2013  . Dyslipidemia 08/10/2013  . NSTEMI (non-ST elevated myocardial infarction) ([place]) 08/08/2013    Class: Hospitalized for  . CAD S/P percutaneous coronary angioplasty - Xience outline DES to mid RCA (2.75 mm x 33 mm --> 3.0 mm) 08/08/2013    Class: History of  . Presence of drug coated stent in right coronary artery 08/08/2013  . Erectile dysfunction 01/18/2013  . Pterygium of left eye 08/03/2012  . Cerumen impaction, right 07/25/2011  . Periodontal disease 08/21/2010  . Allergic sinusitis 08/21/2010  . Hypotestosteronism 09/20/2009  . Anxiety state 09/20/2009  . URINARY FREQUENCY 03/16/2009  . PHARYNGITIS, RECURRENT 10/06/2008  . EXTERNAL HEMORRHOIDS WITHOUT MENTION COMP 09/15/2008  . DIARRHEA 09/15/2008  . Human immunodeficiency virus (HIV) disease ([place]) 08/04/2006  . Depression 08/04/2006  . PERIPHERAL NEUROPATHY 08/04/2006  . ALLERGIC RHINITIS 08/04/2006  . GERD 08/04/2006  . VERTIGO 08/04/2006   PHYSICAL THERAPY DISCHARGE SUMMARY  Visits from Start of Care: 1  Current functional level related to goals / functional  outcomes: Unknown patient only came for initial    Remaining deficits: Unknown    Education / Equipment: HEP  Plan: Patient agrees to discharge.  Patient goals were not met. Patient is being discharged due to meeting the stated rehab goals.  ?????       Carney Living PT DPT  02/12/2019, 10:08 AM  [facility] [address] [place], [place], [address] Phone: [phone]   Fax:  [phone]  Name: Albert Brown MRN: [id number] Date of Birth: 01/05/1956

## 2019-02-17 ENCOUNTER — Encounter: Payer: Self-pay | Admitting: Podiatry

## 2019-02-17 ENCOUNTER — Encounter: Payer: PRIVATE HEALTH INSURANCE | Admitting: Podiatry

## 2019-02-17 ENCOUNTER — Other Ambulatory Visit: Payer: Self-pay

## 2019-02-17 ENCOUNTER — Ambulatory Visit (INDEPENDENT_AMBULATORY_CARE_PROVIDER_SITE_OTHER): Payer: PRIVATE HEALTH INSURANCE | Admitting: Podiatry

## 2019-02-17 ENCOUNTER — Ambulatory Visit (INDEPENDENT_AMBULATORY_CARE_PROVIDER_SITE_OTHER): Payer: PRIVATE HEALTH INSURANCE

## 2019-02-17 DIAGNOSIS — M21611 Bunion of right foot: Secondary | ICD-10-CM | POA: Diagnosis not present

## 2019-02-17 DIAGNOSIS — Z9889 Other specified postprocedural states: Secondary | ICD-10-CM

## 2019-02-17 NOTE — Progress Notes (Signed)
Subjective:   Patient ID: Albert Brown, male   DOB: 63 y.o.   MRN: [id number]   HPI Patient presents stating I am doing pretty well with mild discomfort but I am doing physical therapy and the motion seems to be improving   ROS      Objective:  Physical Exam  Neurovascular status intact negative Bevelyn Buckles' sign noted wound edges coapted well first metatarsal right with good range of motion no crepitus of the joint noted currently     Assessment:  Doing well post osteotomy first metatarsal right     Plan:  H&P condition reviewed and I went ahead today and I advised on elevation compression and gradual increase in activity with return to soft shoe over the next several weeks.  Reappoint 4 weeks or earlier if needed  X-rays indicated the osteotomy is healing well fixation in place joint looks open and congruent with current

## 2019-03-03 ENCOUNTER — Ambulatory Visit: Payer: PRIVATE HEALTH INSURANCE | Admitting: Physical Therapy

## 2019-03-09 ENCOUNTER — Other Ambulatory Visit: Payer: Self-pay

## 2019-03-09 ENCOUNTER — Ambulatory Visit: Payer: PRIVATE HEALTH INSURANCE

## 2019-03-10 ENCOUNTER — Other Ambulatory Visit: Payer: Self-pay | Admitting: Internal Medicine

## 2019-03-10 DIAGNOSIS — E785 Hyperlipidemia, unspecified: Secondary | ICD-10-CM

## 2019-03-10 DIAGNOSIS — I2510 Atherosclerotic heart disease of native coronary artery without angina pectoris: Secondary | ICD-10-CM

## 2019-03-10 MED ORDER — SIMVASTATIN 20 MG PO TABS: ORAL_TABLET | ORAL | 0 refills | Status: DC

## 2019-03-12 ENCOUNTER — Encounter: Payer: Self-pay | Admitting: Infectious Diseases

## 2019-03-16 ENCOUNTER — Ambulatory Visit: Payer: PRIVATE HEALTH INSURANCE | Attending: Internal Medicine

## 2019-03-16 DIAGNOSIS — Z20822 Contact with and (suspected) exposure to covid-19: Secondary | ICD-10-CM

## 2019-03-17 ENCOUNTER — Encounter: Payer: PRIVATE HEALTH INSURANCE | Admitting: Podiatry

## 2019-03-17 LAB — NOVEL CORONAVIRUS, NAA: SARS-CoV-2, NAA: NOT DETECTED

## 2019-03-18 ENCOUNTER — Encounter: Payer: PRIVATE HEALTH INSURANCE | Admitting: Podiatry

## 2019-03-23 ENCOUNTER — Ambulatory Visit: Payer: PRIVATE HEALTH INSURANCE | Admitting: Interventional Cardiology

## 2019-03-29 ENCOUNTER — Other Ambulatory Visit: Payer: Self-pay

## 2019-03-29 ENCOUNTER — Ambulatory Visit (HOSPITAL_COMMUNITY)
Admission: EM | Admit: 2019-03-29 | Discharge: 2019-03-29 | Disposition: A | Discharge status: Home / Self Care | Payer: PRIVATE HEALTH INSURANCE | Attending: Family Medicine | Admitting: Family Medicine

## 2019-03-29 ENCOUNTER — Encounter (HOSPITAL_COMMUNITY): Payer: Self-pay

## 2019-03-29 ENCOUNTER — Encounter: Payer: Self-pay | Admitting: *Deleted

## 2019-03-29 DIAGNOSIS — J22 Unspecified acute lower respiratory infection: Secondary | ICD-10-CM | POA: Diagnosis not present

## 2019-03-29 MED ORDER — BENZONATATE 100 MG PO CAPS: 100.0000 mg | ORAL_CAPSULE | Freq: Three times a day (TID) | ORAL | 0 refills | Status: DC

## 2019-03-29 MED ORDER — AMOXICILLIN-POT CLAVULANATE 875-125 MG PO TABS: 1.0000 | ORAL_TABLET | Freq: Two times a day (BID) | ORAL | 0 refills | Status: DC

## 2019-03-29 MED ORDER — AZITHROMYCIN 250 MG PO TABS: 250.0000 mg | ORAL_TABLET | Freq: Every day | ORAL | 0 refills | Status: DC

## 2019-03-29 NOTE — ED Triage Notes (Signed)
Pt presents with productive cough and congestion for past 2 weeks with no relief with OTC remedies & medication.  Pt had covid test done 2 weeks ago it was negative.

## 2019-03-29 NOTE — Discharge Instructions (Addendum)
Take the medication as prescribed If your symptoms do not improve or worsen please follow up.

## 2019-03-30 NOTE — ED Provider Notes (Signed)
[place]    CSN: [id number] Arrival date & time: 03/29/19  1355      History   Chief Complaint Chief Complaint  Patient presents with  . Cough  . Congestion    HPI Albert Brown is a 64 y.o. male.   Patient is a 64 year old male past medical history of allergies, anxiety, CAD, depression, erectile dysfunction, hemorrhoids, GERD, HIV, MI, essential hypertension.  Pt here for productive cough, congestion, x 2 weeks. Taking OTC meds without any relief. The cough has worsened. Negative COVID test 2 weeks ago. No fever, chills, chest pain, SOB. No recent travels. No LE edema.   ROS per HPI      Past Medical History:  Diagnosis Date  . ALLERGIC RHINITIS   . Allergic sinusitis   . Anxiety state, unspecified   . CAD S/P percutaneous coronary angioplasty 08/08/2013   mRCA  Xience Alpine DES 2.75 mm x 33 mm (3.0 mm)  . Cerumen impaction, right   . DEPRESSION   . Diarrhea   . Erectile dysfunction   . EXTERNAL HEMORRHOIDS WITHOUT MENTION COMP   . GERD   . HIV DISEASE   . HIV infection ([place])   . HYPOGONADISM   . Non-ST elevated myocardial infarction (non-STEMI) ([place]) 08/08/2013   SubAcute100% RCA -- PCI; Echo: EF 55-605, Normal LV Size & Function - ~mild HK of Inferior Myocardium with mildly reduced RV function.  Normal Valve function  . Periodontal disease   . PERIPHERAL NEUROPATHY   . PHARYNGITIS, RECURRENT   . Pterygium of left eye   . Urinary frequency   . Urticaria   . VERTIGO     Patient Active Problem List   Diagnosis Date Noted  . Encounter for screening colonoscopy 08/24/2018  . Chronic urticaria 08/11/2018  . Dyspnea on exertion 08/01/2017  . Coronary artery disease involving native coronary artery of native heart without angina pectoris 08/01/2017  . Insomnia 04/30/2017  . Essential hypertension 09/27/2016  . Hepatitis B immune 07/31/2016  . Hyperlipidemia LDL goal <70 12/05/2013  . Dyslipidemia 08/10/2013  . NSTEMI (non-ST elevated  myocardial infarction) ([place]) 08/08/2013    Class: Hospitalized for  . CAD S/P percutaneous coronary angioplasty - Xience outline DES to mid RCA (2.75 mm x 33 mm --> 3.0 mm) 08/08/2013    Class: History of  . Presence of drug coated stent in right coronary artery 08/08/2013  . Erectile dysfunction 01/18/2013  . Pterygium of left eye 08/03/2012  . Cerumen impaction, right 07/25/2011  . Periodontal disease 08/21/2010  . Allergic sinusitis 08/21/2010  . Hypotestosteronism 09/20/2009  . Anxiety state 09/20/2009  . URINARY FREQUENCY 03/16/2009  . PHARYNGITIS, RECURRENT 10/06/2008  . EXTERNAL HEMORRHOIDS WITHOUT MENTION COMP 09/15/2008  . DIARRHEA 09/15/2008  . Human immunodeficiency virus (HIV) disease ([place]) 08/04/2006  . Depression 08/04/2006  . PERIPHERAL NEUROPATHY 08/04/2006  . ALLERGIC RHINITIS 08/04/2006  . GERD 08/04/2006  . VERTIGO 08/04/2006    Past Surgical History:  Procedure Laterality Date  . LEFT HEART CATHETERIZATION WITH CORONARY ANGIOGRAM N/A 08/08/2013   Procedure: LEFT HEART CATHETERIZATION WITH CORONARY ANGIOGRAM;  Surgeon: [name], MD;  Location: [facility] CATH LAB;  Service: Cardiovascular;  Laterality: N/A;  . PERCUTANEOUS CORONARY STENT INTERVENTION (PCI-S)  08/08/2013   mRCA 100% --> Xience Alpine DES 2.75 mm x 33 mm --> 3.0 mm       Home Medications    Prior to Admission medications   Medication Sig Start Date End Date Taking? Authorizing Provider  amoxicillin-clavulanate (AUGMENTIN) 875-125 MG tablet Take 1 tablet by mouth every 12 (twelve) hours. 03/29/19   Loura Halt A, NP  aspirin EC 81 MG tablet Take 1 tablet (81 mg total) by mouth daily. 01/02/16   Wende Bushy, MD  azithromycin (ZITHROMAX) 250 MG tablet Take 1 tablet (250 mg total) by mouth daily. Take first 2 tablets together, then 1 every day until finished. 03/29/19   Loura Halt A, NP  benzonatate (TESSALON) 100 MG capsule Take 1 capsule (100 mg total) by mouth every 8 (eight) hours. Take 1-2  capsules every 8 hours as needed 03/29/19   Loura Halt A, NP  cetirizine (ZYRTEC) 10 MG tablet Take 10 mg by mouth daily.    [provider]  Coenzyme Q10-Vitamin E (QUNOL ULTRA COQ10 PO) Take 2 Doses/Fill by mouth daily. Take 2 teaspoons by mouth daily.    [provider]  fluticasone (FLONASE) 50 MCG/ACT nasal spray Place 2 sprays into both nostrils daily. 04/04/16   Melynda Ripple, MD  lisinopril (ZESTRIL) 20 MG tablet Take 1 tablet (20 mg total) by mouth daily. 02/05/19   Lucious Groves, DO  Multiple Vitamin (MULTI VITAMIN MENS PO) Take 1 tablet by mouth daily.    [provider]  nitroGLYCERIN (NITROSTAT) 0.4 MG SL tablet Place 1 tablet (0.4 mg total) under the tongue every 5 (five) minutes as needed for chest pain. 01/16/15   [name], MD  ODEFSEY 200-25-25 MG TABS tablet TAKE 1 TABLET BY MOUTH EVERY DAY WITH BREAKFAST 12/18/18   Campbell Riches, MD  promethazine (PHENERGAN) 25 MG tablet Take 1 tablet (25 mg total) by mouth every 8 (eight) hours as needed for nausea or vomiting. 01/25/19   Wallene Huh, DPM  simvastatin (ZOCOR) 20 MG tablet TAKE 1 TABLET BY MOUTH DAILY AT 6 PM 03/10/19   End, Harrell Gave, MD  traZODone (DESYREL) 50 MG tablet Take 1 tablet (50 mg total) by mouth at bedtime as needed for sleep. 04/30/17   Campbell Riches, MD    Family History Family History  Problem Relation Age of Onset  . Prostate cancer Father   . Osteosarcoma Father   . Prostate cancer Brother   . Cancer Mother        brain tumor    Social History Social History   Tobacco Use  . Smoking status: Former Smoker    Types: Cigarettes    Quit date: 03/02/2007    Years since quitting: 12.0  . Smokeless tobacco: Never Used  . Tobacco comment: pt. no longer smokes  Substance Use Topics  . Alcohol use: Yes    Comment: weekends  . Drug use: Yes    Types: Marijuana    Comment: daily      Allergies   Brilinta [ticagrelor], Codeine, Bactrim  [sulfamethoxazole-trimethoprim], Ciprofloxacin, Dapsone, Pravastatin, and Sulfamethoxazole-trimethoprim   Review of Systems Review of Systems   Physical Exam Triage Vital Signs ED Triage Vitals  Enc Vitals Group     BP 03/29/19 1511 (!) 152/98     Pulse Rate 03/29/19 1511 96     Resp 03/29/19 1511 17     Temp 03/29/19 1511 98.2 F (36.8 C)     Temp Source 03/29/19 1511 Oral     SpO2 03/29/19 1511 98 %     Weight --      Height --      Head Circumference --      Peak Flow --      Pain Score  03/29/19 1508 4     Pain Loc --      Pain Edu? --      Excl. in [place]? --    No data found.  Updated Vital Signs BP (!) 152/98 (BP Location: Left Arm)   Pulse 96   Temp 98.2 F (36.8 C) (Oral)   Resp 17   SpO2 98%   Visual Acuity Right Eye Distance:   Left Eye Distance:   Bilateral Distance:    Right Eye Near:   Left Eye Near:    Bilateral Near:     Physical Exam Vitals and nursing note reviewed.  Constitutional:      General: He is not in acute distress.    Appearance: Normal appearance. He is not ill-appearing, toxic-appearing or diaphoretic.  HENT:     Head: Normocephalic and atraumatic.     Right Ear: Tympanic membrane and ear canal normal.     Left Ear: Tympanic membrane and ear canal normal.     Nose: Nose normal.  Eyes:     Conjunctiva/sclera: Conjunctivae normal.  Cardiovascular:     Rate and Rhythm: Normal rate and regular rhythm.     Pulses: Normal pulses.     Heart sounds: Normal heart sounds.  Pulmonary:     Effort: Pulmonary effort is normal.     Breath sounds: Rhonchi and rales present.  Musculoskeletal:        General: Normal range of motion.  Skin:    General: Skin is warm and dry.  Neurological:     Mental Status: He is alert.  Psychiatric:        Mood and Affect: Mood normal.      UC Treatments / Results  Labs (all labs ordered are listed, but only abnormal results are displayed) Labs Reviewed - No data to  display  EKG   Radiology No results found.  Procedures Procedures (including critical care time)  Medications Ordered in UC Medications - No data to display  Initial Impression / Assessment and Plan / UC Course  I have reviewed the triage vital signs and the nursing notes.  Pertinent labs & imaging results that were available during my care of the patient were reviewed by me and considered in my medical decision making (see chart for details).     Lower respiratory tract infection- based on HPI and exam findings this is most likely. Treating with dual therapy abx. Tessalon pearls for cough.  Follow up as needed for continued or worsening symptoms  Final Clinical Impressions(s) / UC Diagnoses   Final diagnoses:  Lower respiratory infection (e.g., bronchitis, pneumonia, pneumonitis, pulmonitis)     Discharge Instructions     Take the medication as prescribed If your symptoms do not improve or worsen please follow up.     ED Prescriptions    Medication Sig Dispense Auth. Provider   amoxicillin-clavulanate (AUGMENTIN) 875-125 MG tablet Take 1 tablet by mouth every 12 (twelve) hours. 14 tablet Sahan Pen A, NP   azithromycin (ZITHROMAX) 250 MG tablet Take 1 tablet (250 mg total) by mouth daily. Take first 2 tablets together, then 1 every day until finished. 6 tablet Ellagrace Yoshida A, NP   benzonatate (TESSALON) 100 MG capsule Take 1 capsule (100 mg total) by mouth every 8 (eight) hours. Take 1-2 capsules every 8 hours as needed 21 capsule Latina Frank A, NP     PDMP not reviewed this encounter.   Orvan July, NP 03/30/19 1429

## 2019-04-01 ENCOUNTER — Ambulatory Visit: Payer: PRIVATE HEALTH INSURANCE | Admitting: Cardiology

## 2019-04-01 ENCOUNTER — Encounter: Payer: Self-pay | Admitting: Cardiology

## 2019-04-01 ENCOUNTER — Other Ambulatory Visit: Payer: Self-pay

## 2019-04-01 VITALS — BP 140/90 | HR 84 | Ht 68.0 in | Wt 173.4 lb

## 2019-04-01 DIAGNOSIS — B20 Human immunodeficiency virus [HIV] disease: Secondary | ICD-10-CM

## 2019-04-01 DIAGNOSIS — E785 Hyperlipidemia, unspecified: Secondary | ICD-10-CM | POA: Diagnosis not present

## 2019-04-01 DIAGNOSIS — I2510 Atherosclerotic heart disease of native coronary artery without angina pectoris: Secondary | ICD-10-CM | POA: Diagnosis not present

## 2019-04-01 DIAGNOSIS — Z79899 Other long term (current) drug therapy: Secondary | ICD-10-CM | POA: Diagnosis not present

## 2019-04-01 DIAGNOSIS — I10 Essential (primary) hypertension: Secondary | ICD-10-CM | POA: Diagnosis not present

## 2019-04-01 DIAGNOSIS — I25119 Atherosclerotic heart disease of native coronary artery with unspecified angina pectoris: Secondary | ICD-10-CM

## 2019-04-01 LAB — LIPID PANEL
Chol/HDL Ratio: 2.9 ratio (ref 0.0–5.0)
Cholesterol, Total: 126 mg/dL (ref 100–199)
HDL: 44 mg/dL (ref >39)
LDL Chol Calc (NIH): 65 mg/dL (ref 0–99)
Triglycerides: 89 mg/dL (ref 0–149)
VLDL Cholesterol Cal: 17 mg/dL (ref 5–40)

## 2019-04-01 LAB — BASIC METABOLIC PANEL
BUN/Creatinine Ratio: 20 (ref 10–24)
BUN: 20 mg/dL (ref 8–27)
CO2: 19 mmol/L — ABNORMAL LOW (ref 20–29)
Calcium: 9.9 mg/dL (ref 8.6–10.2)
Chloride: 104 mmol/L (ref 96–106)
Creatinine, Ser: 1.01 mg/dL (ref 0.76–1.27)
GFR calc Af Amer: 92 mL/min/{1.73_m2} (ref >59)
GFR calc non Af Amer: 80 mL/min/{1.73_m2} (ref >59)
Glucose: 94 mg/dL (ref 65–99)
Potassium: 4.9 mmol/L (ref 3.5–5.2)
Sodium: 137 mmol/L (ref 134–144)

## 2019-04-01 LAB — ALT: ALT: 43 IU/L (ref 0–44)

## 2019-04-01 MED ORDER — LOSARTAN POTASSIUM 100 MG PO TABS: 100.0000 mg | ORAL_TABLET | Freq: Every day | ORAL | 3 refills | Status: DC

## 2019-04-01 NOTE — Progress Notes (Signed)
Cardiology Office Note:    Date:  04/01/2019   ID:  Albert Brown, DOB 1955/07/13, MRN [id number]  PCP:  Maudie Mercury, MD  Cardiologist:  Candee Furbish, MD  Electrophysiologist:  None   Referring MD: Campbell Riches, MD     History of Present Illness:    Albert Brown is a 64 y.o. male with coronary artery disease prior inferior ST elevation myocardial infarction in 2015 with DES to the mid RCA, hypertension hyperlipidemia HIV GERD here for follow-up.  Overall doing quite well no fevers chills nausea vomiting.  Previously had sporadic episodes of upper chest pain below the clavicles which is nonexertional.  Echo EF normal  Xience Alpine 2.75 x 33 mm drug-eluting stent.  Exercise treadmill test in 2019 showed normal capacity 7 minutes no ischemia  Had PNA, COVID negative.   Past Medical History:  Diagnosis Date  . ALLERGIC RHINITIS   . Allergic sinusitis   . Anxiety state, unspecified   . CAD S/P percutaneous coronary angioplasty 08/08/2013   mRCA  Xience Alpine DES 2.75 mm x 33 mm (3.0 mm)  . Cerumen impaction, right   . DEPRESSION   . Diarrhea   . Erectile dysfunction   . EXTERNAL HEMORRHOIDS WITHOUT MENTION COMP   . GERD   . HIV DISEASE   . HIV infection ([place])   . HYPOGONADISM   . Non-ST elevated myocardial infarction (non-STEMI) ([place]) 08/08/2013   SubAcute100% RCA -- PCI; Echo: EF 55-605, Normal LV Size & Function - ~mild HK of Inferior Myocardium with mildly reduced RV function.  Normal Valve function  . Periodontal disease   . PERIPHERAL NEUROPATHY   . PHARYNGITIS, RECURRENT   . Pterygium of left eye   . Urinary frequency   . Urticaria   . VERTIGO     Past Surgical History:  Procedure Laterality Date  . LEFT HEART CATHETERIZATION WITH CORONARY ANGIOGRAM N/A 08/08/2013   Procedure: LEFT HEART CATHETERIZATION WITH CORONARY ANGIOGRAM;  Surgeon: [name], MD;  Location: [facility] CATH LAB;  Service: Cardiovascular;  Laterality: N/A;  . PERCUTANEOUS  CORONARY STENT INTERVENTION (PCI-S)  08/08/2013   mRCA 100% --> Xience Alpine DES 2.75 mm x 33 mm --> 3.0 mm    Current Medications: Current Meds  Medication Sig  . amoxicillin-clavulanate (AUGMENTIN) 875-125 MG tablet Take 1 tablet by mouth every 12 (twelve) hours.  Marland Kitchen aspirin EC 81 MG tablet Take 1 tablet (81 mg total) by mouth daily.  Marland Kitchen azithromycin (ZITHROMAX) 250 MG tablet Take 1 tablet (250 mg total) by mouth daily. Take first 2 tablets together, then 1 every day until finished.  . benzonatate (TESSALON) 100 MG capsule Take 1 capsule (100 mg total) by mouth every 8 (eight) hours. Take 1-2 capsules every 8 hours as needed  . cetirizine (ZYRTEC) 10 MG tablet Take 10 mg by mouth daily.  . Coenzyme Q10-Vitamin E (QUNOL ULTRA COQ10 PO) Take 2 Doses/Fill by mouth daily. Take 2 teaspoons by mouth daily.  . fluticasone (FLONASE) 50 MCG/ACT nasal spray Place 2 sprays into both nostrils daily.  . Multiple Vitamin (MULTI VITAMIN MENS PO) Take 1 tablet by mouth daily.  . nitroGLYCERIN (NITROSTAT) 0.4 MG SL tablet Place 1 tablet (0.4 mg total) under the tongue every 5 (five) minutes as needed for chest pain.  . ODEFSEY 200-25-25 MG TABS tablet TAKE 1 TABLET BY MOUTH EVERY DAY WITH BREAKFAST  . promethazine (PHENERGAN) 25 MG tablet Take 1 tablet (25 mg total) by mouth every 8 (eight)  hours as needed for nausea or vomiting.  . simvastatin (ZOCOR) 20 MG tablet TAKE 1 TABLET BY MOUTH DAILY AT 6 PM  . traZODone (DESYREL) 50 MG tablet Take 1 tablet (50 mg total) by mouth at bedtime as needed for sleep.  . [DISCONTINUED] lisinopril (ZESTRIL) 20 MG tablet Take 1 tablet (20 mg total) by mouth daily.     Allergies:   Brilinta [ticagrelor], Codeine, Bactrim [sulfamethoxazole-trimethoprim], Ciprofloxacin, Dapsone, Pravastatin, and Sulfamethoxazole-trimethoprim   Social History   Socioeconomic History  . Marital status: Single    Spouse name: Not on file  . Number of children: Not on file  . Years of  education: Not on file  . Highest education level: Not on file  Occupational History  . Not on file  Tobacco Use  . Smoking status: Former Smoker    Types: Cigarettes    Quit date: 03/02/2007    Years since quitting: 12.0  . Smokeless tobacco: Never Used  . Tobacco comment: pt. no longer smokes  Substance and Sexual Activity  . Alcohol use: Yes    Comment: weekends  . Drug use: Yes    Types: Marijuana    Comment: daily   . Sexual activity: Never    Partners: Male    Comment: pt. refused condoms  Other Topics Concern  . Not on file  Social History Narrative  . Not on file   Social Determinants of Health   Financial Resource Strain:   . Difficulty of Paying Living Expenses: Not on file  Food Insecurity:   . Worried About Charity fundraiser in the Last Year: Not on file  . Ran Out of Food in the Last Year: Not on file  Transportation Needs:   . Lack of Transportation (Medical): Not on file  . Lack of Transportation (Non-Medical): Not on file  Physical Activity:   . Days of Exercise per Week: Not on file  . Minutes of Exercise per Session: Not on file  Stress:   . Feeling of Stress : Not on file  Social Connections:   . Frequency of Communication with Friends and Family: Not on file  . Frequency of Social Gatherings with Friends and Family: Not on file  . Attends Religious Services: Not on file  . Active Member of Clubs or Organizations: Not on file  . Attends Archivist Meetings: Not on file  . Marital Status: Not on file     Family History: The patient's family history includes Cancer in his mother; Osteosarcoma in his father; Prostate cancer in his brother and father.  ROS:   Please see the history of present illness.     All other systems reviewed and are negative.  EKGs/Labs/Other Studies Reviewed:     EKG:  EKG is  ordered today.  The ekg ordered today demonstrates NSR 84 bpm  Recent Labs: 08/19/2018: TSH <0.006 12/09/2018: ALT 29; BUN 19;  Creat 1.15; Hemoglobin 15.5; Platelets 189; Potassium 5.0; Sodium 141  Recent Lipid Panel    Component Value Date/Time   CHOL 126 06/02/2018 1445   CHOL 112 04/08/2016 0751   TRIG 133 06/02/2018 1445   HDL 43 06/02/2018 1445   HDL 33 (L) 04/08/2016 0751   CHOLHDL 2.9 06/02/2018 1445   VLDL 29 05/09/2016 1700   LDLCALC 62 06/02/2018 1445    Physical Exam:    VS:  BP 140/90   Pulse 84   Ht 5\' 8"  (1.727 m)   Wt 173 lb 6.4 oz (78.7  kg)   BMI 26.37 kg/m     Wt Readings from Last 3 Encounters:  04/01/19 173 lb 6.4 oz (78.7 kg)  12/29/18 175 lb (79.4 kg)  11/17/18 177 lb (80.3 kg)     GEN:  Well nourished, well developed in no acute distress HEENT: Normal NECK: No JVD; No carotid bruits LYMPHATICS: No lymphadenopathy CARDIAC: RRR, no murmurs, rubs, gallops RESPIRATORY:  Clear to auscultation without rales, wheezing or rhonchi  ABDOMEN: Soft, non-tender, non-distended MUSCULOSKELETAL:  No edema; No deformity  SKIN: Warm and dry NEUROLOGIC:  Alert and oriented x 3 PSYCHIATRIC:  Normal affect   ASSESSMENT:    1. Essential hypertension   2. Coronary artery disease involving native coronary artery of native heart without angina pectoris   3. Hyperlipidemia LDL goal <70   4. Medication management    PLAN:    In order of problems listed above:  Coronary artery disease -Doing very well.  Atypical chest pain at times.  Normal ETT in 2019.  Excellent.  Continue with goal-directed therapy.  Has been intolerant of other statins and he will continue with simvastatin.  Essential hypertension -Still little bit elevated.  Continue to monitor.  He does have a blood pressure cuff that he is borrowing.  Dr. And had at 1 point tried to prescribe amlodipine 2.5 but he did not fill.  He was reluctant at that time.  On lisinopril.  He is interested in another medication.  We will go ahead and start losartan 100 mg once a day this should not affect his libido.   We will check a basic  metabolic profile today.  Prior creatinine 1.15.  Prior potassium 5.0.  We will have him see Sharee Pimple in a virtual visit in 1 month.  Dyspnea on exertion -Previously multifactorial.  Continue with weight loss exercise improved blood pressure.  Hyperlipidemia -LDL goal is less than 70.  Continue with simvastatin.  He has tried other statins but has had issues with them in the past.  We will check a lipid panel and ALT.     Medication Adjustments/Labs and Tests Ordered: Current medicines are reviewed at length with the patient today.  Concerns regarding medicines are outlined above.  Orders Placed This Encounter  Procedures  . ALT  . Basic metabolic panel  . Lipid panel  . EKG 12-Lead   Meds ordered this encounter  Medications  . losartan (COZAAR) 100 MG tablet    Sig: Take 1 tablet (100 mg total) by mouth daily.    Dispense:  90 tablet    Refill:  3    Patient Instructions  Medication Instructions:  Stop you Lisinopril. Start Losartan 100 mg once a day. Continue all other medications as listed.  *If you need a refill on your cardiac medications before your next appointment, please call your pharmacy*  Lab Work: Please have blood work today.  (Lipid, ALT, BMP) If you have labs (blood work) drawn today and your tests are completely normal, you will receive your results only by: Marland Kitchen MyChart Message (if you have MyChart) OR . A paper copy in the mail If you have any lab test that is abnormal or we need to change your treatment, we will call you to review the results.  Follow-Up: At [facility], you and your health needs are our priority.  As part of our continuing mission to provide you with exceptional heart care, we have created designated Provider Care Teams.  These Care Teams include your primary Cardiologist (physician)  and Advanced Practice Providers (APPs -  Physician Assistants and Nurse Practitioners) who all work together to provide you with the care you need, when you  need it.  Your next appointment:   4 week(s)  The format for your next appointment:   Virtual Visit   Provider:   Kathyrn Drown, NP   Thank you for choosing [facility]!!    Please avoid foods high in potassium as such bananna, citrus fruits, dried fruits/nut, potatoes and tomatoes.  You last potassium level was on the high end of normal.      Signed, Candee Furbish, MD  04/01/2019 12:25 PM    [facility]

## 2019-04-01 NOTE — Patient Instructions (Addendum)
Medication Instructions:  Stop you Lisinopril. Start Losartan 100 mg once a day. Continue all other medications as listed.  *If you need a refill on your cardiac medications before your next appointment, please call your pharmacy*  Lab Work: Please have blood work today.  (Lipid, ALT, BMP) If you have labs (blood work) drawn today and your tests are completely normal, you will receive your results only by: Marland Kitchen MyChart Message (if you have MyChart) OR . A paper copy in the mail If you have any lab test that is abnormal or we need to change your treatment, we will call you to review the results.  Follow-Up: At [facility], you and your health needs are our priority.  As part of our continuing mission to provide you with exceptional heart care, we have created designated Provider Care Teams.  These Care Teams include your primary Cardiologist (physician) and Advanced Practice Providers (APPs -  Physician Assistants and Nurse Practitioners) who all work together to provide you with the care you need, when you need it.  Your next appointment:   4 week(s)  The format for your next appointment:   Virtual Visit   Provider:   Kathyrn Drown, NP   Thank you for choosing [facility]!!    Please avoid foods high in potassium as such bananna, citrus fruits, dried fruits/nut, potatoes and tomatoes.  You last potassium level was on the high end of normal.

## 2019-04-02 ENCOUNTER — Telehealth: Payer: Self-pay

## 2019-04-02 NOTE — Telephone Encounter (Signed)
The patient has been notified of the result and verbalized understanding.  All questions (if any) were answered. Wilma Flavin, RN 04/02/2019 4:02 PM

## 2019-04-02 NOTE — Telephone Encounter (Signed)
-----   Message from Jerline Pain, MD sent at 04/02/2019  2:35 PM EST ----- Excellent kidney function.  LDL cholesterol 65, excellent, normal liver function ALT 43. Candee Furbish, MD

## 2019-04-06 ENCOUNTER — Other Ambulatory Visit: Payer: Self-pay | Admitting: Cardiology

## 2019-04-06 DIAGNOSIS — Z9861 Coronary angioplasty status: Secondary | ICD-10-CM

## 2019-04-06 DIAGNOSIS — E785 Hyperlipidemia, unspecified: Secondary | ICD-10-CM

## 2019-04-06 DIAGNOSIS — I2510 Atherosclerotic heart disease of native coronary artery without angina pectoris: Secondary | ICD-10-CM

## 2019-04-06 MED ORDER — SIMVASTATIN 20 MG PO TABS: ORAL_TABLET | ORAL | 3 refills | Status: DC

## 2019-04-19 ENCOUNTER — Encounter: Payer: PRIVATE HEALTH INSURANCE | Admitting: Internal Medicine

## 2019-04-19 ENCOUNTER — Ambulatory Visit: Payer: PRIVATE HEALTH INSURANCE | Admitting: Internal Medicine

## 2019-04-19 ENCOUNTER — Other Ambulatory Visit: Payer: Self-pay

## 2019-04-19 ENCOUNTER — Encounter: Payer: Self-pay | Admitting: Internal Medicine

## 2019-04-19 VITALS — BP 152/86 | HR 90 | Temp 98.1°F | Ht 68.0 in | Wt 180.6 lb

## 2019-04-19 DIAGNOSIS — B20 Human immunodeficiency virus [HIV] disease: Secondary | ICD-10-CM | POA: Diagnosis not present

## 2019-04-19 DIAGNOSIS — G8929 Other chronic pain: Secondary | ICD-10-CM

## 2019-04-19 DIAGNOSIS — Z79899 Other long term (current) drug therapy: Secondary | ICD-10-CM

## 2019-04-19 DIAGNOSIS — H9313 Tinnitus, bilateral: Secondary | ICD-10-CM

## 2019-04-19 DIAGNOSIS — I10 Essential (primary) hypertension: Secondary | ICD-10-CM | POA: Diagnosis not present

## 2019-04-19 DIAGNOSIS — R519 Headache, unspecified: Secondary | ICD-10-CM

## 2019-04-19 MED ORDER — ACETAMINOPHEN 500 MG PO TABS: 500.0000 mg | ORAL_TABLET | Freq: Once | ORAL | Status: DC

## 2019-04-19 MED ORDER — LOSARTAN POTASSIUM 25 MG PO TABS: 25.0000 mg | ORAL_TABLET | Freq: Every day | ORAL | 11 refills | Status: DC

## 2019-04-19 MED ORDER — RAMELTEON 8 MG PO TABS: 8.0000 mg | ORAL_TABLET | Freq: Every evening | ORAL | 1 refills | Status: DC | PRN

## 2019-04-19 MED ORDER — ACETAMINOPHEN 325 MG PO TABS: 650.0000 mg | ORAL_TABLET | ORAL | 2 refills | Status: AC | PRN

## 2019-04-19 NOTE — Patient Instructions (Signed)
To Mr Haberkorn,  It was a pleasure meeting you today. Today we addressed your tinnitus, headache and blood pressure medication. For your tinnitus, continue to monitor, we will also have you follow with ENT. For your headache, please take your tylenol as directed but do not go above 3g a day. We will see you in a month to two months. Be safe.  Sincerely,  Maudie Mercury, MD

## 2019-04-19 NOTE — Progress Notes (Signed)
CC: Headache, tinnitus   HPI:  Mr.Helton H Legendre is a 64 y.o. with a PMH noticed below, who presents to the [facility] with complaints of headache. To see the management of his acute and chronic conditions, please see the attached assessment and plan note under the encounters tab.   Past Medical History:  Diagnosis Date  . ALLERGIC RHINITIS   . Allergic sinusitis   . Anxiety state, unspecified   . CAD S/P percutaneous coronary angioplasty 08/08/2013   mRCA  Xience Alpine DES 2.75 mm x 33 mm (3.0 mm)  . Cerumen impaction, right   . DEPRESSION   . Diarrhea   . Erectile dysfunction   . EXTERNAL HEMORRHOIDS WITHOUT MENTION COMP   . GERD   . HIV DISEASE   . HIV infection ([place])   . HYPOGONADISM   . Non-ST elevated myocardial infarction (non-STEMI) ([place]) 08/08/2013   SubAcute100% RCA -- PCI; Echo: EF 55-605, Normal LV Size & Function - ~mild HK of Inferior Myocardium with mildly reduced RV function.  Normal Valve function  . Periodontal disease   . PERIPHERAL NEUROPATHY   . PHARYNGITIS, RECURRENT   . Pterygium of left eye   . Urinary frequency   . Urticaria   . VERTIGO    Review of Systems:   Review of Systems  Constitutional: Negative for chills, fever and malaise/fatigue.  HENT: Positive for tinnitus. Negative for congestion, ear discharge, ear pain and nosebleeds.   Eyes: Negative for blurred vision, double vision and photophobia.  Respiratory: Negative for cough, sputum production and shortness of breath.   Musculoskeletal: Negative for myalgias and neck pain.  Neurological: Positive for headaches. Negative for tingling, tremors and weakness.     Physical Exam:  Vitals:   04/19/19 1426  BP: (!) 152/86  Pulse: 90  Temp: 98.1 F (36.7 C)  TempSrc: Oral  SpO2: 98%  Weight: 180 lb 9.6 oz (81.9 kg)  Height: 5\' 8"  (1.727 m)   Physical Exam Vitals reviewed.  Constitutional:      Appearance: Normal appearance.  HENT:     Head: Normocephalic and atraumatic.     Right  Ear: Tympanic membrane, ear canal and external ear normal. There is no impacted cerumen.     Left Ear: Tympanic membrane, ear canal and external ear normal. There is no impacted cerumen.     Ears:     Comments: Cerumen appreciated in the R ear canal, with visualization of the the tympanic membrane. No erythema noted in the canal, tympanic membrane intact.   L Ear canal visualized with no erythema. Tympanic membrane intact.     Nose: Nose normal.  Eyes:     General: No scleral icterus.       Right eye: No discharge.        Left eye: No discharge.     Conjunctiva/sclera: Conjunctivae normal.  Cardiovascular:     Rate and Rhythm: Normal rate and regular rhythm.     Pulses: Normal pulses.     Heart sounds: Normal heart sounds. No murmur. No friction rub. No gallop.   Pulmonary:     Effort: Pulmonary effort is normal.     Breath sounds: Normal breath sounds. No wheezing, rhonchi or rales.  Musculoskeletal:     Cervical back: No rigidity.     Comments: Minor scratch noted on the 4th digit with no adenopathy appreciated in the LUE.   Lymphadenopathy:     Cervical: No cervical adenopathy.  Skin:    General: Skin  is warm and dry.     Findings: No bruising, lesion or rash.  Neurological:     Mental Status: He is alert and oriented to person, place, and time.  Psychiatric:        Mood and Affect: Mood normal.        Behavior: Behavior normal.      Assessment & Plan:   See Encounters Tab for problem based charting.  Patient discussed with Dr. Philipp Ovens

## 2019-04-20 ENCOUNTER — Encounter: Payer: Self-pay | Admitting: Internal Medicine

## 2019-04-20 DIAGNOSIS — H9319 Tinnitus, unspecified ear: Secondary | ICD-10-CM | POA: Insufficient documentation

## 2019-04-20 NOTE — Assessment & Plan Note (Signed)
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 The patient states that approximately a month ago, he was at work when a loud noise went off right behind him. The patient states that after the event the ringing in his ears has worsened. He states that since that time he has developed a headache as well, which is making it difficult to sleep. Patient also endorses that he has very acute hearing, but this new ringing is making it more difficult to hear music and conversations. He endorses that he is beginning to lose sleep over his hearing. He denies feeling light headed, nauseated, off balance, changes in vision, fevers, or pain in his ears. He does state that he recently completed a course of antibiotics for sinusitis and pharyngitis. He also endorses that he was scratched by an outdoor cat a week, and with his diagnosis of HIV, he is worried about possible infection of his brain.  Assessment:  Patient presenting with worsening tinnitus with identifiable trigger. Patient with complaints of some hearing loss due to the worsening of his condition. After physical examination, his sinuses did not express tenderness to palpation, there was no lymphadenopathy on the affected side of the cat scratch, with no fevers self reported or found at today's visit. Patient's recent HIV titers are reassuring, his physical examination and history are low on the differential for zoonotic infectious etiologies like toxoplasmosis or lymphoreticulosis. Additionally, even though patient is having new headaches, it is likely due to recent audiologic insult.   Plan:  - Referral to ENT for assessment of hearing loss - Tylenol 325 mg 2 tabe Q4H PRN. Typically therapeutic dose of Naproxen would be initiated for headache relief, but due to patient on odesefy, NSAIDs are contraindicated. - Ramelteon 8 mg nightly.

## 2019-04-20 NOTE — Assessment & Plan Note (Signed)
Patient with Hypertension presenting to the [facility] with a BP of 152/86. Patient with a states that he was started on Cozaar 100 mg after following with cardiology. Patient states that the medication had unpleasant side effects. Will continue Cozaar but will start at 25 mg and monitor for side effects at this dose. Patient to have another assessment by cardiology in a month.   Plan:  - Cozaar 25mg   - Follow up with cardiology  - Continue to monitor blood pressures at home.

## 2019-04-22 ENCOUNTER — Encounter: Payer: Self-pay | Admitting: Infectious Diseases

## 2019-04-29 ENCOUNTER — Telehealth: Payer: PRIVATE HEALTH INSURANCE | Admitting: Cardiology

## 2019-04-29 NOTE — Progress Notes (Signed)
Internal Medicine Clinic Attending  Case discussed with Dr. Winters at the time of the visit.  We reviewed the resident's history and exam and pertinent patient test results.  I agree with the assessment, diagnosis, and plan of care documented in the resident's note.  

## 2019-05-04 ENCOUNTER — Telehealth: Payer: PRIVATE HEALTH INSURANCE | Admitting: Cardiology

## 2019-05-04 ENCOUNTER — Other Ambulatory Visit: Payer: Self-pay | Admitting: Infectious Diseases

## 2019-05-04 DIAGNOSIS — B20 Human immunodeficiency virus [HIV] disease: Secondary | ICD-10-CM

## 2019-05-10 NOTE — Progress Notes (Deleted)
Office Visit Note  Patient: Albert Brown             Date of Birth: 1955/05/19           MRN: [id number]             PCP: Maudie Mercury, MD Referring: Maudie Mercury, MD Visit Date: 05/18/2019 Occupation: @GUAROCC @  Subjective:  No chief complaint on file.   History of Present Illness: [name] is a 64 y.o. male ***   Activities of Daily Living:  Patient reports morning stiffness for *** {minute/hour:19697}.   Patient {ACTIONS;DENIES/REPORTS:21021675::"Denies"} nocturnal pain.  Difficulty dressing/grooming: {ACTIONS;DENIES/REPORTS:21021675::"Denies"} Difficulty climbing stairs: {ACTIONS;DENIES/REPORTS:21021675::"Denies"} Difficulty getting out of chair: {ACTIONS;DENIES/REPORTS:21021675::"Denies"} Difficulty using hands for taps, buttons, cutlery, and/or writing: {ACTIONS;DENIES/REPORTS:21021675::"Denies"}  No Rheumatology ROS completed.   PMFS History:  Patient Active Problem List   Diagnosis Date Noted  . Tinnitus 04/20/2019  . Encounter for screening colonoscopy 08/24/2018  . Chronic urticaria 08/11/2018  . Dyspnea on exertion 08/01/2017  . Coronary artery disease involving native coronary artery of native heart with angina pectoris ([place]) 08/01/2017  . Insomnia 04/30/2017  . Essential hypertension 09/27/2016  . Hepatitis B immune 07/31/2016  . Hyperlipidemia LDL goal <70 12/05/2013  . Dyslipidemia 08/10/2013  . NSTEMI (non-ST elevated myocardial infarction) ([place]) 08/08/2013    Class: Hospitalized for  . CAD S/P percutaneous coronary angioplasty - Xience outline DES to mid RCA (2.75 mm x 33 mm --> 3.0 mm) 08/08/2013    Class: History of  . Presence of drug coated stent in right coronary artery 08/08/2013  . Erectile dysfunction 01/18/2013  . Pterygium of left eye 08/03/2012  . Cerumen impaction, right 07/25/2011  . Periodontal disease 08/21/2010  . Allergic sinusitis 08/21/2010  . Hypotestosteronism 09/20/2009  . Anxiety state 09/20/2009  . URINARY  FREQUENCY 03/16/2009  . PHARYNGITIS, RECURRENT 10/06/2008  . EXTERNAL HEMORRHOIDS WITHOUT MENTION COMP 09/15/2008  . DIARRHEA 09/15/2008  . Human immunodeficiency virus (HIV) disease ([place]) 08/04/2006  . Depression 08/04/2006  . PERIPHERAL NEUROPATHY 08/04/2006  . ALLERGIC RHINITIS 08/04/2006  . GERD 08/04/2006  . VERTIGO 08/04/2006    Past Medical History:  Diagnosis Date  . ALLERGIC RHINITIS   . Allergic sinusitis   . Anxiety state, unspecified   . CAD S/P percutaneous coronary angioplasty 08/08/2013   mRCA  Xience Alpine DES 2.75 mm x 33 mm (3.0 mm)  . Cerumen impaction, right   . DEPRESSION   . Diarrhea   . Erectile dysfunction   . EXTERNAL HEMORRHOIDS WITHOUT MENTION COMP   . GERD   . HIV DISEASE   . HIV infection ([place])   . HYPOGONADISM   . Non-ST elevated myocardial infarction (non-STEMI) ([place]) 08/08/2013   SubAcute100% RCA -- PCI; Echo: EF 55-605, Normal LV Size & Function - ~mild HK of Inferior Myocardium with mildly reduced RV function.  Normal Valve function  . Periodontal disease   . PERIPHERAL NEUROPATHY   . PHARYNGITIS, RECURRENT   . Pterygium of left eye   . Urinary frequency   . Urticaria   . VERTIGO     Family History  Problem Relation Age of Onset  . Prostate cancer Father   . Osteosarcoma Father   . Prostate cancer Brother   . Cancer Mother        brain tumor   Past Surgical History:  Procedure Laterality Date  . LEFT HEART CATHETERIZATION WITH CORONARY ANGIOGRAM N/A 08/08/2013   Procedure: LEFT HEART CATHETERIZATION WITH CORONARY ANGIOGRAM;  Surgeon: [name],  MD;  Location: [facility] CATH [facility];  Service: Cardiovascular;  Laterality: N/A;  . PERCUTANEOUS CORONARY STENT INTERVENTION (PCI-S)  08/08/2013   mRCA 100% --> Xience Alpine DES 2.75 mm x 33 mm --> 3.0 mm   Social History   Social History Narrative  . Not on file   Immunization History  Administered Date(s) Administered  . Influenza Whole 04/05/2010  . Influenza,inj,Quad PF,6+ Mos  12/19/2015, 12/29/2018  . Pneumococcal Polysaccharide-23 11/26/2002, 07/25/2011  . Tdap 09/10/2011     Objective: Vital Signs: There were no vitals taken for this visit.   Physical Exam   Musculoskeletal Exam: ***  CDAI Exam: CDAI Score: -- Patient Global: --; Provider Global: -- Swollen: --; Tender: -- Joint Exam 05/18/2019   No joint exam has been documented for this visit   There is currently no information documented on the homunculus. Go to the Rheumatology activity and complete the homunculus joint exam.  Investigation: No additional findings.  Imaging: No results found.  Recent Labs: Lab Results  Component Value Date   WBC 6.8 12/09/2018   HGB 15.5 12/09/2018   PLT 189 12/09/2018   NA 137 04/01/2019   K 4.9 04/01/2019   CL 104 04/01/2019   CO2 19 (L) 04/01/2019   GLUCOSE 94 04/01/2019   BUN 20 04/01/2019   CREATININE 1.01 04/01/2019   BILITOT 0.3 12/09/2018   ALKPHOS 106 09/09/2018   AST 22 12/09/2018   ALT 43 04/01/2019   PROT 6.5 12/09/2018   ALBUMIN 4.4 09/09/2018   CALCIUM 9.9 04/01/2019   GFRAA 92 04/01/2019    Speciality Comments: No specialty comments available.  Procedures:  No procedures performed Allergies: Brilinta [ticagrelor], Codeine, Bactrim [sulfamethoxazole-trimethoprim], Ciprofloxacin, Dapsone, Pravastatin, and Sulfamethoxazole-trimethoprim   Assessment / Plan:     Visit Diagnoses: No diagnosis found.  Orders: No orders of the defined types were placed in this encounter.  No orders of the defined types were placed in this encounter.   Face-to-face time spent with patient was *** minutes. Greater than 50% of time was spent in counseling and coordination of care.  Follow-Up Instructions: No follow-ups on file.   Ofilia Neas, PA-C  Note - This record has been created using Dragon software.  Chart creation errors have been sought, but may not always  have been located. Such creation errors do not reflect on  the  standard of medical care.

## 2019-05-18 ENCOUNTER — Ambulatory Visit: Payer: PRIVATE HEALTH INSURANCE | Admitting: Rheumatology

## 2019-06-16 ENCOUNTER — Ambulatory Visit: Payer: PRIVATE HEALTH INSURANCE | Admitting: Internal Medicine

## 2019-06-16 VITALS — BP 142/92 | HR 69 | Temp 98.6°F | Ht 68.0 in | Wt 188.8 lb

## 2019-06-16 DIAGNOSIS — I10 Essential (primary) hypertension: Secondary | ICD-10-CM

## 2019-06-16 DIAGNOSIS — S80819A Abrasion, unspecified lower leg, initial encounter: Secondary | ICD-10-CM

## 2019-06-16 DIAGNOSIS — Z79899 Other long term (current) drug therapy: Secondary | ICD-10-CM

## 2019-06-16 DIAGNOSIS — W5503XA Scratched by cat, initial encounter: Secondary | ICD-10-CM | POA: Diagnosis not present

## 2019-06-16 DIAGNOSIS — Z87891 Personal history of nicotine dependence: Secondary | ICD-10-CM | POA: Diagnosis not present

## 2019-06-16 MED ORDER — AMLODIPINE BESYLATE 10 MG PO TABS: 10.0000 mg | ORAL_TABLET | Freq: Every day | ORAL | 1 refills | Status: DC

## 2019-06-16 MED ORDER — AMOXICILLIN-POT CLAVULANATE 875-125 MG PO TABS: 1.0000 | ORAL_TABLET | Freq: Two times a day (BID) | ORAL | 0 refills | Status: AC

## 2019-06-16 NOTE — Patient Instructions (Signed)
Albert Brown.  I have prescribed an antibiotic for your leg.  I have also stopped your losartan and switched you to amlodipine 10mg  daily.

## 2019-06-16 NOTE — Progress Notes (Signed)
CC: Cat Scratch, HTN  HPI:  [name] is a 64 y.o. male with PMH below.  Today we will address Cat Scratch, HTN  Please see A&P for status of the patient's chronic medical conditions  Past Medical History:  Diagnosis Date  . ALLERGIC RHINITIS   . Allergic sinusitis   . Anxiety state, unspecified   . CAD S/P percutaneous coronary angioplasty 08/08/2013   mRCA  Xience Alpine DES 2.75 mm x 33 mm (3.0 mm)  . Cerumen impaction, right   . DEPRESSION   . Diarrhea   . Erectile dysfunction   . EXTERNAL HEMORRHOIDS WITHOUT MENTION COMP   . GERD   . HIV DISEASE   . HIV infection ([place])   . HYPOGONADISM   . Non-ST elevated myocardial infarction (non-STEMI) ([place]) 08/08/2013   SubAcute100% RCA -- PCI; Echo: EF 55-605, Normal LV Size & Function - ~mild HK of Inferior Myocardium with mildly reduced RV function.  Normal Valve function  . Periodontal disease   . PERIPHERAL NEUROPATHY   . PHARYNGITIS, RECURRENT   . Pterygium of left eye   . Urinary frequency   . Urticaria   . VERTIGO    Review of Systems:  ROS: Pulmonary: pt denies increased work of breathing, shortness of breath,  Cardiac: pt denies palpitations, chest pain,  Abdominal: pt denies abdominal pain, nausea, vomiting, or diarrhea   Physical Exam:  Vitals:   06/16/19 1502  BP: (!) 142/92  Pulse: 69  Temp: 98.6 F (37 C)  TempSrc: Oral  SpO2: 96%  Weight: 188 lb 12.8 oz (85.6 kg)  Height: 5\' 8"  (1.727 m)   Cardiac: JVD flat, normal rate and rhythm, clear s1 and s2, no murmurs, rubs or gallops, no LE edema Pulmonary: CTAB, not in distress Abdominal: non distended abdomen, soft and nontender Skin: healed scratches anterior shin, posterior leg calf area several erythematous lesions with healing scabs no pustular exudate, attempted to express.   Media Information   Document Information  Photos    06/16/2019 15:17  Attached To:  Office Visit on 06/16/19 with Katherine Roan, MD  Source  Information  Albert Brown, Jenne Pane, MD  [facility]    Psych: Alert, conversant, in good spirits   Social History   Socioeconomic History  . Marital status: Single    Spouse name: Not on file  . Number of children: Not on file  . Years of education: Not on file  . Highest education level: Not on file  Occupational History  . Not on file  Tobacco Use  . Smoking status: Former Smoker    Types: Cigarettes    Quit date: 03/02/2007    Years since quitting: 12.2  . Smokeless tobacco: Never Used  . Tobacco comment: pt. no longer smokes  Substance and Sexual Activity  . Alcohol use: Yes    Comment: sometimes.  . Drug use: Not Currently    Types: Marijuana    Comment: daily   . Sexual activity: Never    Partners: Male  Other Topics Concern  . Not on file  Social History Narrative  . Not on file   Social Determinants of Health   Financial Resource Strain:   . Difficulty of Paying Living Expenses:   Food Insecurity:   . Worried About Charity fundraiser in the Last Year:   . Arboriculturist in the Last Year:   Transportation Needs:   . Film/video editor (Medical):   Marland Kitchen  Lack of Transportation (Non-Medical):   Physical Activity:   . Days of Exercise per Week:   . Minutes of Exercise per Session:   Stress:   . Feeling of Stress :   Social Connections:   . Frequency of Communication with Friends and Family:   . Frequency of Social Gatherings with Friends and Family:   . Attends Religious Services:   . Active Member of Clubs or Organizations:   . Attends Archivist Meetings:   Marland Kitchen Marital Status:   Intimate Partner Violence:   . Fear of Current or Ex-Partner:   . Emotionally Abused:   Marland Kitchen Physically Abused:   . Sexually Abused:     Family History  Problem Relation Age of Onset  . Prostate cancer Father   . Osteosarcoma Father   . Prostate cancer Brother   . Cancer Mother        brain tumor    Assessment & Plan:   See Encounters Tab for  problem based charting.  Patient discussed with Dr. Angelia Mould

## 2019-06-17 NOTE — Assessment & Plan Note (Addendum)
Pt reports his cat saw another cat and was startled and attacked his leg.  He doesn't think he was bitten but can't be sure as it happened quickly.  He has an indoor cat that never goes outside.    -Given appearance of leg and close together puncture wounds will prescribe augmentin

## 2019-06-17 NOTE — Assessment & Plan Note (Signed)
Pt with poor tolerance of ACEI and tells me today he is also having leg discomfort/weakness with losartan.  He has been doing a lot of reading about ACEI and ARB side effects he has stopped losartan completely and asks if he can be put on a different class.  Ideally we would use ARB given his coronary disease.  His resting heart rate is rather low I don't think we can get enough blood pressure effect with a beta blocker and he will almost certainly have side effects with this.  -start amlodipine 10mg  -one month follow up

## 2019-06-18 NOTE — Progress Notes (Signed)
Internal Medicine Clinic Attending  Case discussed with Dr. Winfrey  at the time of the visit.  We reviewed the resident's history and exam and pertinent patient test results.  I agree with the assessment, diagnosis, and plan of care documented in the resident's note.  

## 2019-07-14 ENCOUNTER — Other Ambulatory Visit: Payer: Self-pay

## 2019-07-14 ENCOUNTER — Encounter: Payer: Self-pay | Admitting: Internal Medicine

## 2019-07-14 ENCOUNTER — Ambulatory Visit (INDEPENDENT_AMBULATORY_CARE_PROVIDER_SITE_OTHER): Payer: PRIVATE HEALTH INSURANCE | Admitting: Internal Medicine

## 2019-07-14 VITALS — BP 155/95 | HR 76 | Temp 97.9°F | Ht 68.0 in | Wt 186.1 lb

## 2019-07-14 DIAGNOSIS — I10 Essential (primary) hypertension: Secondary | ICD-10-CM

## 2019-07-14 DIAGNOSIS — Z1211 Encounter for screening for malignant neoplasm of colon: Secondary | ICD-10-CM

## 2019-07-14 DIAGNOSIS — F329 Major depressive disorder, single episode, unspecified: Secondary | ICD-10-CM

## 2019-07-14 DIAGNOSIS — F32A Depression, unspecified: Secondary | ICD-10-CM

## 2019-07-14 DIAGNOSIS — R5383 Other fatigue: Secondary | ICD-10-CM | POA: Diagnosis not present

## 2019-07-14 LAB — POCT GLYCOSYLATED HEMOGLOBIN (HGB A1C): Hemoglobin A1C: 5.4 % (ref 4.0–5.6)

## 2019-07-14 LAB — GLUCOSE, CAPILLARY: Glucose-Capillary: 87 mg/dL (ref 70–99)

## 2019-07-14 MED ORDER — LISINOPRIL 20 MG PO TABS: 20.0000 mg | ORAL_TABLET | Freq: Every day | ORAL | 3 refills | Status: DC

## 2019-07-14 NOTE — Patient Instructions (Addendum)
[name],   It was a pleasure seeing you in clinic. Today we discussed:  Fatigue: I am checking some lab work today. I will let you know of the results.   Blood pressure: I will restart your lisinopril at this visit. Please continue to monitor your BP at home. If you experience any symptoms, please let us know. Please follow up in 4 weeks for repeat BP and lab work.   I will also place referral to our counselor, Albert Brown, at this time.  Please contact us if you have any questions or concerns.  Thank you!

## 2019-07-14 NOTE — Progress Notes (Signed)
   CC: fatigue, irritability, hypertension f/u  HPI:  Albert Brown is a 64 y.o. male with PMHx as listed below presenting for evaluation of fatigue and irritability. He is also following up for hypertension since being started on amlodipine one month ago. Please see problem based charting for complete assessment and plan.  Past Medical History:  Diagnosis Date  . ALLERGIC RHINITIS   . Allergic sinusitis   . Anxiety state, unspecified   . CAD S/P percutaneous coronary angioplasty 08/08/2013   mRCA  Xience Alpine DES 2.75 mm x 33 mm (3.0 mm)  . Cerumen impaction, right   . DEPRESSION   . Diarrhea   . Erectile dysfunction   . EXTERNAL HEMORRHOIDS WITHOUT MENTION COMP   . GERD   . HIV DISEASE   . HIV infection ([place])   . HYPOGONADISM   . Non-ST elevated myocardial infarction (non-STEMI) ([place]) 08/08/2013   SubAcute100% RCA -- PCI; Echo: EF 55-605, Normal LV Size & Function - ~mild HK of Inferior Myocardium with mildly reduced RV function.  Normal Valve function  . Periodontal disease   . PERIPHERAL NEUROPATHY   . PHARYNGITIS, RECURRENT   . Pterygium of left eye   . Urinary frequency   . Urticaria   . VERTIGO    Review of Systems:  Negative except as stated in HPI.  Physical Exam:  Vitals:   07/14/19 1449  BP: (!) 155/95  Pulse: 76  Temp: 97.9 F (36.6 C)  TempSrc: Oral  SpO2: 98%  Weight: 186 lb 1.6 oz (84.4 kg)  Height: 5\' 8"  (1.727 m)   Physical Exam Constitutional:      Appearance: Normal appearance. He is not ill-appearing or diaphoretic.  HENT:     Mouth/Throat:     Mouth: Mucous membranes are moist.     Pharynx: Oropharynx is clear.  Eyes:     General: No scleral icterus.    Extraocular Movements: Extraocular movements intact.     Conjunctiva/sclera: Conjunctivae normal.  Cardiovascular:     Rate and Rhythm: Normal rate and regular rhythm.     Pulses: Normal pulses.     Heart sounds: Normal heart sounds. No murmur. No friction rub. No gallop.    Pulmonary:     Effort: Pulmonary effort is normal. No respiratory distress.     Breath sounds: Normal breath sounds. No wheezing.  Abdominal:     General: Bowel sounds are normal.     Palpations: Abdomen is soft.  Musculoskeletal:        General: No swelling or deformity. Normal range of motion.     Cervical back: Normal range of motion and neck supple. No tenderness.  Skin:    General: Skin is warm and dry.     Capillary Refill: Capillary refill takes less than 2 seconds.     Findings: No rash.  Neurological:     General: No focal deficit present.     Mental Status: He is alert and oriented to person, place, and time. Mental status is at baseline.     Cranial Nerves: No cranial nerve deficit.     Sensory: No sensory deficit.     Motor: No weakness.      Assessment & Plan:   See Encounters Tab for problem based charting.  Patient discussed with Dr. Lynnae January

## 2019-07-15 LAB — BMP8+ANION GAP
Anion Gap: 14 mmol/L (ref 10.0–18.0)
BUN/Creatinine Ratio: 23 (ref 10–24)
BUN: 23 mg/dL (ref 8–27)
CO2: 20 mmol/L (ref 20–29)
Calcium: 9.6 mg/dL (ref 8.6–10.2)
Chloride: 105 mmol/L (ref 96–106)
Creatinine, Ser: 1 mg/dL (ref 0.76–1.27)
GFR calc Af Amer: 93 mL/min/{1.73_m2} (ref >59)
GFR calc non Af Amer: 81 mL/min/{1.73_m2} (ref >59)
Glucose: 85 mg/dL (ref 65–99)
Potassium: 4.7 mmol/L (ref 3.5–5.2)
Sodium: 139 mmol/L (ref 134–144)

## 2019-07-15 LAB — CBC
Hematocrit: 47.9 % (ref 37.5–51.0)
Hemoglobin: 16.5 g/dL (ref 13.0–17.7)
MCH: 31.1 pg (ref 26.6–33.0)
MCHC: 34.4 g/dL (ref 31.5–35.7)
MCV: 90 fL (ref 79–97)
Platelets: 189 10*3/uL (ref 150–450)
RBC: 5.31 x10E6/uL (ref 4.14–5.80)
RDW: 12.9 % (ref 11.6–15.4)
WBC: 6.5 10*3/uL (ref 3.4–10.8)

## 2019-07-15 LAB — T4, FREE: Free T4: 1.35 ng/dL (ref 0.82–1.77)

## 2019-07-15 LAB — TSH: TSH: <0.005 u[IU]/mL — ABNORMAL LOW (ref 0.450–4.500)

## 2019-07-16 LAB — IRON,TIBC AND FERRITIN PANEL
Ferritin: 78 ng/mL (ref 30–400)
Iron Saturation: 35 % (ref 15–55)
Iron: 124 ug/dL (ref 38–169)
Total Iron Binding Capacity: 357 ug/dL (ref 250–450)
UIBC: 233 ug/dL (ref 111–343)

## 2019-07-16 LAB — SPECIMEN STATUS REPORT

## 2019-07-23 DIAGNOSIS — R5383 Other fatigue: Secondary | ICD-10-CM | POA: Insufficient documentation

## 2019-07-23 MED ORDER — PROMETHAZINE HCL 25 MG PO TABS: 25.0000 mg | ORAL_TABLET | Freq: Three times a day (TID) | ORAL | 0 refills | Status: DC | PRN

## 2019-07-23 NOTE — Assessment & Plan Note (Signed)
Patient requesting referral for colonoscopy as he has never had one before. He notes no weight loss or changes in his stool color or caliber. He does note significant family history of cancer.  Referral to GI placed for colonoscopy

## 2019-07-23 NOTE — Assessment & Plan Note (Addendum)
Patient endorses several weeks of fatigue and irritability. He notes that he doesn't feel like his usual self. He denies any fevers, chills, headaches, dizziness/lightheadedness, recent illness, abdominal pain, changes in appetite, changes in bowel habits, skin or hair changes. He does endorse nausea but attributes this to taking amlodipine.  He recently was evaluated for cat scratch one month ago and was prescribed augmentin. He notes intermittently taking this and still has 3 days of antibiotics left. On examination, improvement of puncture sites without any signs of infection noted.  Evaluated with CBC, Iron panel, BMP, TSH and fT4. He does have low TSH but fT4 wnl. No electrolyte abnormalities. CBC and iron panel wnl.  Suspect his depression could be contributing to the fatigue and irritability.  Will f/u on referral to [facility] for [facility]

## 2019-07-23 NOTE — Assessment & Plan Note (Signed)
Referral placed to [facility]

## 2019-07-23 NOTE — Assessment & Plan Note (Signed)
BP 155/95. He notes poor tolerance ACEi/ARB and was recently started on amlodipine 10mg  at prior visit. He notes that this has made him significantly weak and notes that he does not like the way this feels. He notes that previously he was able to tolerate lisinopril better; although he did not have the desired blood pressure results. He would like to be placed back on lisinopril.   Plan: Discontinue amlodipine 10mg  daily Resume lisinopril 20mg  daily

## 2019-07-27 NOTE — Addendum Note (Signed)
Addended by: Larey Dresser A on: 07/27/2019 09:13 AM   Modules accepted: Level of Service

## 2019-07-27 NOTE — Progress Notes (Signed)
Internal Medicine Clinic Attending  Case discussed with Dr. Aslam at the time of the visit.  We reviewed the resident's history and exam and pertinent patient test results.  I agree with the assessment, diagnosis, and plan of care documented in the resident's note.  

## 2019-08-09 ENCOUNTER — Encounter: Payer: Self-pay | Admitting: *Deleted

## 2019-08-16 ENCOUNTER — Encounter: Payer: Self-pay | Admitting: Physician Assistant

## 2019-08-23 ENCOUNTER — Encounter: Payer: PRIVATE HEALTH INSURANCE | Admitting: Internal Medicine

## 2019-09-16 ENCOUNTER — Other Ambulatory Visit (HOSPITAL_COMMUNITY)
Admission: RE | Admit: 2019-09-16 | Discharge: 2019-09-16 | Disposition: A | Discharge status: Home / Self Care | Payer: PRIVATE HEALTH INSURANCE | Source: Ambulatory Visit | Attending: Infectious Diseases | Admitting: Infectious Diseases

## 2019-09-16 ENCOUNTER — Ambulatory Visit: Payer: PRIVATE HEALTH INSURANCE

## 2019-09-16 ENCOUNTER — Other Ambulatory Visit: Payer: PRIVATE HEALTH INSURANCE

## 2019-09-16 ENCOUNTER — Other Ambulatory Visit: Payer: Self-pay

## 2019-09-16 DIAGNOSIS — Z113 Encounter for screening for infections with a predominantly sexual mode of transmission: Secondary | ICD-10-CM | POA: Diagnosis present

## 2019-09-16 DIAGNOSIS — Z79899 Other long term (current) drug therapy: Secondary | ICD-10-CM

## 2019-09-16 DIAGNOSIS — B20 Human immunodeficiency virus [HIV] disease: Secondary | ICD-10-CM

## 2019-09-17 LAB — T-HELPER CELL (CD4) - (RCID CLINIC ONLY)
CD4 % Helper T Cell: 31 % — ABNORMAL LOW (ref 33–65)
CD4 T Cell Abs: 479 /uL (ref 400–1790)

## 2019-09-17 LAB — URINE CYTOLOGY ANCILLARY ONLY
Chlamydia: NEGATIVE
Comment: NEGATIVE
Comment: NORMAL
Neisseria Gonorrhea: NEGATIVE

## 2019-09-20 ENCOUNTER — Ambulatory Visit (INDEPENDENT_AMBULATORY_CARE_PROVIDER_SITE_OTHER): Payer: PRIVATE HEALTH INSURANCE | Admitting: Physician Assistant

## 2019-09-20 ENCOUNTER — Encounter: Payer: Self-pay | Admitting: Physician Assistant

## 2019-09-20 VITALS — BP 127/72 | HR 72 | Ht 68.0 in | Wt 177.0 lb

## 2019-09-20 DIAGNOSIS — R194 Change in bowel habit: Secondary | ICD-10-CM

## 2019-09-20 DIAGNOSIS — K219 Gastro-esophageal reflux disease without esophagitis: Secondary | ICD-10-CM

## 2019-09-20 DIAGNOSIS — R110 Nausea: Secondary | ICD-10-CM

## 2019-09-20 MED ORDER — SUTAB 1479-225-188 MG PO TABS: 1.0000 | ORAL_TABLET | Freq: Once | ORAL | 0 refills | Status: AC

## 2019-09-20 MED ORDER — PANTOPRAZOLE SODIUM 40 MG PO TBEC: 40.0000 mg | DELAYED_RELEASE_TABLET | Freq: Every day | ORAL | 5 refills | Status: DC

## 2019-09-20 NOTE — Progress Notes (Signed)
Chief Complaint: Nausea, fatigue, change in bowel habits, GERD  HPI:    Albert Brown is a 64 year old Caucasian male with a past medical history as listed below including CAD and depression as well as GERD and HIV, who was referred to me by Bartholomew Crews, MD for a complaint of nausea, change in bowel habits, GERD and fatigue.      07/14/2019 patient seen in clinic by his PCP for fatigue and irritability.  He was also referred to our clinic at that time for colonoscopy.  Reported some family history of colon cancer.    07/14/2019 labs included a hemoglobin A1c of 5.4, normal iron studies, CBC, BMP and T4.  TSH was decreased.    Today, the patient tells me that he has been feeling bad for about 3 months now.  He has a constellation of symptoms including fatigue and irritability.  Also tells me that he has nausea every day with occasional reflux symptoms, one time he even woke from sleep feeling like he was going to suffocate.  Also increase in gas and his weight varies up and down and bowel movements which vary between normal to soft to loose.      Patient is frustrated with all of his symptoms and would like to know what is going on and would like to get back to his normal energetic self.    Denies fever, chills, blood in his stool, change in medications or diet.  Past Medical History:  Diagnosis Date  . ALLERGIC RHINITIS   . Allergic sinusitis   . Anxiety state, unspecified   . CAD S/P percutaneous coronary angioplasty 08/08/2013   mRCA  Xience Alpine DES 2.75 mm x 33 mm (3.0 mm)  . Cerumen impaction, right   . DEPRESSION   . Diarrhea   . Erectile dysfunction   . EXTERNAL HEMORRHOIDS WITHOUT MENTION COMP   . GERD   . HIV DISEASE   . HIV infection ([place])   . HYPOGONADISM   . Non-ST elevated myocardial infarction (non-STEMI) ([place]) 08/08/2013   SubAcute100% RCA -- PCI; Echo: EF 55-605, Normal LV Size & Function - ~mild HK of Inferior Myocardium with mildly reduced RV function.  Normal  Valve function  . Periodontal disease   . PERIPHERAL NEUROPATHY   . PHARYNGITIS, RECURRENT   . Pterygium of left eye   . Urinary frequency   . Urticaria   . VERTIGO     Past Surgical History:  Procedure Laterality Date  . LEFT HEART CATHETERIZATION WITH CORONARY ANGIOGRAM N/A 08/08/2013   Procedure: LEFT HEART CATHETERIZATION WITH CORONARY ANGIOGRAM;  Surgeon: [name], MD;  Location: [facility] CATH LAB;  Service: Cardiovascular;  Laterality: N/A;  . PERCUTANEOUS CORONARY STENT INTERVENTION (PCI-S)  08/08/2013   mRCA 100% --> Xience Alpine DES 2.75 mm x 33 mm --> 3.0 mm    Current Outpatient Medications  Medication Sig Dispense Refill  . acetaminophen (TYLENOL) 325 MG tablet Take 2 tablets (650 mg total) by mouth every 4 (four) hours as needed. 100 tablet 2  . aspirin EC 81 MG tablet Take 1 tablet (81 mg total) by mouth daily. 90 tablet 3  . azithromycin (ZITHROMAX) 250 MG tablet Take 1 tablet (250 mg total) by mouth daily. Take first 2 tablets together, then 1 every day until finished. 6 tablet 0  . benzonatate (TESSALON) 100 MG capsule Take 1 capsule (100 mg total) by mouth every 8 (eight) hours. Take 1-2 capsules every 8 hours as needed 21 capsule  0  . cetirizine (ZYRTEC) 10 MG tablet Take 10 mg by mouth daily.    . Coenzyme Q10-Vitamin E (QUNOL ULTRA COQ10 PO) Take 2 Doses/Fill by mouth daily. Take 2 teaspoons by mouth daily.    . fluticasone (FLONASE) 50 MCG/ACT nasal spray Place 2 sprays into both nostrils daily. 16 g 0  . lisinopril (ZESTRIL) 20 MG tablet Take 1 tablet (20 mg total) by mouth daily. 90 tablet 3  . Multiple Vitamin (MULTI VITAMIN MENS PO) Take 1 tablet by mouth daily.    . nitroGLYCERIN (NITROSTAT) 0.4 MG SL tablet Place 1 tablet (0.4 mg total) under the tongue every 5 (five) minutes as needed for chest pain. 25 tablet 3  . ODEFSEY 200-25-25 MG TABS tablet TAKE 1 TABLET BY MOUTH EVERY DAY WITH BREAKFAST 30 tablet 4  . promethazine (PHENERGAN) 25 MG tablet Take 1  tablet (25 mg total) by mouth every 8 (eight) hours as needed for nausea or vomiting. 20 tablet 0  . ramelteon (ROZEREM) 8 MG tablet Take 1 tablet (8 mg total) by mouth at bedtime as needed for sleep. 30 tablet 1  . simvastatin (ZOCOR) 20 MG tablet TAKE 1 TABLET BY MOUTH DAILY AT 6 PM 90 tablet 3  . traZODone (DESYREL) 50 MG tablet Take 1 tablet (50 mg total) by mouth at bedtime as needed for sleep. 30 tablet 3   No current facility-administered medications for this visit.    Allergies as of 09/20/2019 - Review Complete 07/14/2019  Allergen Reaction Noted  . Brilinta [ticagrelor] Shortness Of Breath 12/03/2013  . Codeine Anaphylaxis   . Bactrim [sulfamethoxazole-trimethoprim]  04/28/2013  . Ciprofloxacin    . Dapsone    . Pravastatin Other (See Comments) 01/17/2015  . Sulfamethoxazole-trimethoprim  04/05/2010    Family History  Problem Relation Age of Onset  . Prostate cancer Father   . Osteosarcoma Father   . Prostate cancer Brother   . Cancer Mother        brain tumor    Social History   Socioeconomic History  . Marital status: Single    Spouse name: Not on file  . Number of children: Not on file  . Years of education: Not on file  . Highest education level: Not on file  Occupational History  . Not on file  Tobacco Use  . Smoking status: Former Smoker    Types: Cigarettes    Quit date: 03/02/2007    Years since quitting: 12.5  . Smokeless tobacco: Never Used  . Tobacco comment: pt. no longer smokes  Vaping Use  . Vaping Use: Never used  Substance and Sexual Activity  . Alcohol use: Yes    Comment: sometimes.  . Drug use: Not Currently    Types: Marijuana    Comment: daily   . Sexual activity: Never    Partners: Male  Other Topics Concern  . Not on file  Social History Narrative  . Not on file   Social Determinants of Health   Financial Resource Strain:   . Difficulty of Paying Living Expenses:   Food Insecurity:   . Worried About Charity fundraiser in  the Last Year:   . Arboriculturist in the Last Year:   Transportation Needs:   . Film/video editor (Medical):   Marland Kitchen Lack of Transportation (Non-Medical):   Physical Activity:   . Days of Exercise per Week:   . Minutes of Exercise per Session:   Stress:   . Feeling of  Stress :   Social Connections:   . Frequency of Communication with Friends and Family:   . Frequency of Social Gatherings with Friends and Family:   . Attends Religious Services:   . Active Member of Clubs or Organizations:   . Attends Archivist Meetings:   Marland Kitchen Marital Status:   Intimate Partner Violence:   . Fear of Current or Ex-Partner:   . Emotionally Abused:   Marland Kitchen Physically Abused:   . Sexually Abused:     Review of Systems:    Constitutional: No weight loss, fever or chills Skin: No rash  Cardiovascular: No chest pain   Respiratory: No SOB  Gastrointestinal: See HPI and otherwise negative Genitourinary: No dysuria  Neurological: No headache Musculoskeletal: No new muscle or joint pain Hematologic: No bleeding  Psychiatric: No history of depression or anxiety   Physical Exam:  Vital signs: BP 127/72   Pulse 72   Ht 5\' 8"  (1.727 m)   Wt 177 lb (80.3 kg)   SpO2 98%   BMI 26.91 kg/m   Constitutional:   Pleasant Caucasian male appears to be in NAD, Well developed, Well nourished, alert and cooperative Head:  Normocephalic and atraumatic. Eyes:   PEERL, EOMI. No icterus. Conjunctiva pink. Ears:  Normal auditory acuity. Neck:  Supple Throat: Oral cavity and pharynx without inflammation, swelling or lesion.  Respiratory: Respirations even and unlabored. Lungs clear to auscultation bilaterally.   No wheezes, crackles, or rhonchi.  Cardiovascular: Normal S1, S2. No MRG. Regular rate and rhythm. No peripheral edema, cyanosis or pallor.  Gastrointestinal:  Soft, nondistended, mild generalized ttp. No rebound or guarding. Normal bowel sounds. No appreciable masses or hepatomegaly. Rectal:  Not  performed.  Msk:  Symmetrical without gross deformities. Without edema, no deformity or joint abnormality.  Neurologic:  Alert and  oriented x4;  grossly normal neurologically.  Skin:   Dry and intact without significant lesions or rashes. Psychiatric: Demonstrates good judgement and reason without abnormal affect or behaviors.  RELEVANT LABS AND IMAGING: CBC    Component Value Date/Time   WBC 5.9 09/16/2019 1447   RBC 5.22 09/16/2019 1447   HGB 16.2 09/16/2019 1447   HGB 16.5 07/14/2019 1558   HCT 47.2 09/16/2019 1447   HCT 47.9 07/14/2019 1558   PLT 182 09/16/2019 1447   PLT 189 07/14/2019 1558   MCV 90.4 09/16/2019 1447   MCV 90 07/14/2019 1558   MCH 31.0 09/16/2019 1447   MCHC 34.3 09/16/2019 1447   RDW 12.8 09/16/2019 1447   RDW 12.9 07/14/2019 1558   LYMPHSABS 1,897 12/09/2018 1430   LYMPHSABS 1.8 08/19/2018 1617   MONOABS 0.5 09/06/2013 1616   EOSABS 238 12/09/2018 1430   EOSABS 0.3 08/19/2018 1617   BASOSABS 0 12/09/2018 1430   BASOSABS 0.0 08/19/2018 1617    CMP     Component Value Date/Time   NA 140 09/16/2019 1447   NA 139 07/14/2019 1558   K 4.9 09/16/2019 1447   CL 104 09/16/2019 1447   CO2 28 09/16/2019 1447   GLUCOSE 74 09/16/2019 1447   BUN 23 09/16/2019 1447   BUN 23 07/14/2019 1558   CREATININE 1.15 09/16/2019 1447   CALCIUM 9.8 09/16/2019 1447   PROT 7.0 09/16/2019 1447   PROT 6.3 09/09/2018 1453   ALBUMIN 4.4 09/09/2018 1453   AST 22 09/16/2019 1447   ALT 26 09/16/2019 1447   ALKPHOS 106 09/09/2018 1453   BILITOT 0.8 09/16/2019 1447   BILITOT 0.3 09/09/2018 1453  GFRNONAA 81 07/14/2019 1558   GFRNONAA 65 01/03/2014 1535   GFRAA 93 07/14/2019 1558   GFRAA 75 01/03/2014 1535    Assessment: 1.  Change in bowel habits: Radiate from normal to loose etc. with bloating and generalized abdominal pain nausea and reflux; consider IBS+/-gastritis versus other 2.  GERD 3.  Nausea 4.  Fatigue/irritability  Plan: 1.  Scheduled patient for  diagnostic EGD and colonoscopy in the [place] with Dr. Hilarie Fredrickson.  Did discuss risks, benefits, limitations and alternatives and the patient agrees to proceed.  Patient will be Covid tested 2 days prior to time of procedure. 2.  Started the patient on Pantoprazole 40 mg daily, 30-60 minutes before breakfast #30 with 5 refills. 3.  Patient to follow in clinic per recommendations from Dr. Hilarie Fredrickson after time of procedure.  He may need a follow-up visit to discuss irritable bowel if this is suggested by endoscopic procedures.  Ellouise Newer, PA-C [facility] 09/20/2019, 3:12 PM  Cc: Bartholomew Crews, MD

## 2019-09-20 NOTE — Patient Instructions (Signed)
If you are age 62 or older, your body mass index should be between 23-30. Your Body mass index is 26.91 kg/m. If this is out of the aforementioned range listed, please consider follow up with your Primary Care Provider.  If you are age 36 or younger, your body mass index should be between 19-25. Your Body mass index is 26.91 kg/m. If this is out of the aformentioned range listed, please consider follow up with your Primary Care Provider.   We have sent the following medications to your pharmacy for you to pick up at your convenience: Pantoprazole 40 mg once a day 30-60 minutes before breakfast   You have been scheduled for an endoscopy and colonoscopy. Please follow the written instructions given to you at your visit today. Please pick up your prep supplies at the pharmacy within the next 1-3 days. If you use inhalers (even only as needed), please bring them with you on the day of your procedure.

## 2019-09-23 LAB — COMPREHENSIVE METABOLIC PANEL
AG Ratio: 2 (calc) (ref 1.0–2.5)
ALT: 26 U/L (ref 9–46)
AST: 22 U/L (ref 10–35)
Albumin: 4.7 g/dL (ref 3.6–5.1)
Alkaline phosphatase (APISO): 96 U/L (ref 35–144)
BUN: 23 mg/dL (ref 7–25)
CO2: 28 mmol/L (ref 20–32)
Calcium: 9.8 mg/dL (ref 8.6–10.3)
Chloride: 104 mmol/L (ref 98–110)
Creat: 1.15 mg/dL (ref 0.70–1.25)
Globulin: 2.3 g/dL (calc) (ref 1.9–3.7)
Glucose, Bld: 74 mg/dL (ref 65–99)
Potassium: 4.9 mmol/L (ref 3.5–5.3)
Sodium: 140 mmol/L (ref 135–146)
Total Bilirubin: 0.8 mg/dL (ref 0.2–1.2)
Total Protein: 7 g/dL (ref 6.1–8.1)

## 2019-09-23 LAB — CBC
HCT: 47.2 % (ref 38.5–50.0)
Hemoglobin: 16.2 g/dL (ref 13.2–17.1)
MCH: 31 pg (ref 27.0–33.0)
MCHC: 34.3 g/dL (ref 32.0–36.0)
MCV: 90.4 fL (ref 80.0–100.0)
MPV: 11.1 fL (ref 7.5–12.5)
Platelets: 182 10*3/uL (ref 140–400)
RBC: 5.22 10*6/uL (ref 4.20–5.80)
RDW: 12.8 % (ref 11.0–15.0)
WBC: 5.9 10*3/uL (ref 3.8–10.8)

## 2019-09-23 LAB — LIPID PANEL
Cholesterol: 161 mg/dL (ref <200)
HDL: 53 mg/dL (ref >=40)
LDL Cholesterol (Calc): 89 mg/dL (calc)
Non-HDL Cholesterol (Calc): 108 mg/dL (calc) (ref <130)
Total CHOL/HDL Ratio: 3 (calc) (ref <5.0)
Triglycerides: 101 mg/dL (ref <150)

## 2019-09-23 LAB — HIV-1 RNA QUANT-NO REFLEX-BLD
HIV 1 RNA Quant: <20 copies/mL
HIV-1 RNA Quant, Log: <1.3 Log copies/mL

## 2019-09-23 LAB — RPR: RPR Ser Ql: NONREACTIVE

## 2019-09-30 ENCOUNTER — Encounter: Payer: PRIVATE HEALTH INSURANCE | Admitting: Infectious Diseases

## 2019-10-07 ENCOUNTER — Ambulatory Visit: Payer: PRIVATE HEALTH INSURANCE | Admitting: Infectious Diseases

## 2019-10-12 ENCOUNTER — Ambulatory Visit: Payer: PRIVATE HEALTH INSURANCE | Admitting: Infectious Diseases

## 2019-10-13 ENCOUNTER — Other Ambulatory Visit: Payer: Self-pay | Admitting: Infectious Diseases

## 2019-10-13 DIAGNOSIS — B20 Human immunodeficiency virus [HIV] disease: Secondary | ICD-10-CM

## 2019-10-18 NOTE — Progress Notes (Signed)
Addendum: Reviewed and agree with assessment and management plan. Evalette Montrose M, MD  

## 2019-10-21 ENCOUNTER — Ambulatory Visit (INDEPENDENT_AMBULATORY_CARE_PROVIDER_SITE_OTHER): Payer: PRIVATE HEALTH INSURANCE | Admitting: Infectious Diseases

## 2019-10-21 ENCOUNTER — Encounter: Payer: Self-pay | Admitting: Infectious Diseases

## 2019-10-21 ENCOUNTER — Other Ambulatory Visit: Payer: Self-pay

## 2019-10-21 DIAGNOSIS — I10 Essential (primary) hypertension: Secondary | ICD-10-CM | POA: Diagnosis not present

## 2019-10-21 DIAGNOSIS — K056 Periodontal disease, unspecified: Secondary | ICD-10-CM | POA: Diagnosis not present

## 2019-10-21 DIAGNOSIS — K591 Functional diarrhea: Secondary | ICD-10-CM

## 2019-10-21 DIAGNOSIS — B20 Human immunodeficiency virus [HIV] disease: Secondary | ICD-10-CM

## 2019-10-21 MED ORDER — DOVATO 50-300 MG PO TABS: 1.0000 | ORAL_TABLET | Freq: Every day | ORAL | 3 refills | Status: DC

## 2019-10-21 NOTE — Assessment & Plan Note (Signed)
Encouraged him to f/u with PCP.

## 2019-10-21 NOTE — Progress Notes (Signed)
   Subjective:    Patient ID: Albert Brown, male    DOB: October 23, 1955, 64 y.o.   MRN: [id number]  HPI 64yo M with HIV+ (on triumeq -->complera-->odefsy)  He [facility]/2015andwas found to have acute total occlusion of the mid RCA. This was successfully treated with PCI plus a drug-eluting stent. He had well-preserved left ventricular systolic function (EF 38-18%). There was mild basilar to mid inferior hypokinesis. He was recently referred to GI for change in bowel habits.  Have EGD and Colon 10-26-19. He was prescribed PPI and was called by pharm told this was a contraindication.  He has questions about this.   HIV 1 RNA Quant (copies/mL)  Date Value  09/16/2019 <20 NOT DETECTED  12/09/2018 <20 DETECTED (A)  06/02/2018 <20 NOT DETECTED   CD4 T Cell Abs (/uL)  Date Value  09/16/2019 479  12/09/2018 584  06/02/2018 520    Review of Systems  Constitutional: Positive for fatigue. Negative for appetite change, chills, fever and unexpected weight change.  Cardiovascular: Positive for chest pain.  Gastrointestinal: Positive for nausea. Negative for constipation (occasional stool frequency) and diarrhea.  Neurological: Negative for headaches.  Please see HPI. All other systems reviewed and negative. CP is not exertional. Describes more as soreness. No diaphoresis associated with it (states he is sweating anyway). No SOB associated.  Getting COVID vax tomorrow    Objective:   Physical Exam Vitals reviewed.  Constitutional:      Appearance: Normal appearance.  HENT:     Mouth/Throat:     Mouth: Mucous membranes are moist.     Pharynx: Oropharynx is clear. No oropharyngeal exudate.  Eyes:     Extraocular Movements: Extraocular movements intact.     Pupils: Pupils are equal, round, and reactive to light.  Cardiovascular:     Rate and Rhythm: Normal rate and regular rhythm.  Pulmonary:     Effort: Pulmonary effort is normal.     Breath sounds:  Normal breath sounds.  Abdominal:     General: Bowel sounds are normal. There is no distension.     Palpations: Abdomen is soft.     Tenderness: There is no abdominal tenderness.  Musculoskeletal:     Cervical back: Normal range of motion.     Right lower leg: No edema.     Left lower leg: No edema.  Neurological:     General: No focal deficit present.     Mental Status: He is alert.  Psychiatric:        Mood and Affect: Mood normal.           Assessment & Plan:

## 2019-10-21 NOTE — Assessment & Plan Note (Signed)
Will change him to dovato.   He is hep B immune He is getting COVID vax.  Offered/refused condoms.  Await results of colon/egd.  Will see him back in 3 months.

## 2019-10-21 NOTE — Assessment & Plan Note (Signed)
Await results of EGD/colon.

## 2019-10-21 NOTE — Assessment & Plan Note (Addendum)
Dental referral placed today for [facility]. Information to schedule appointment completed today. Grew up with [employer].

## 2019-10-25 ENCOUNTER — Telehealth: Payer: Self-pay | Admitting: Internal Medicine

## 2019-10-26 ENCOUNTER — Encounter: Payer: PRIVATE HEALTH INSURANCE | Admitting: Internal Medicine

## 2019-10-26 ENCOUNTER — Encounter: Payer: Self-pay | Admitting: Infectious Diseases

## 2019-11-02 ENCOUNTER — Other Ambulatory Visit: Payer: Self-pay

## 2019-11-02 DIAGNOSIS — B20 Human immunodeficiency virus [HIV] disease: Secondary | ICD-10-CM

## 2019-11-02 MED ORDER — DOVATO 50-300 MG PO TABS: 1.0000 | ORAL_TABLET | Freq: Every day | ORAL | 3 refills | Status: DC

## 2019-12-15 ENCOUNTER — Telehealth: Payer: Self-pay | Admitting: Physician Assistant

## 2019-12-15 NOTE — Telephone Encounter (Signed)
Patient wanted to confirm he could not eat solid food the day before his procedure . Verified instructions with patient. Patient voiced understanding.

## 2019-12-15 NOTE — Telephone Encounter (Signed)
Patient has questions about procedure instructions for Friday. Please call him.

## 2019-12-17 ENCOUNTER — Ambulatory Visit (AMBULATORY_SURGERY_CENTER): Payer: PRIVATE HEALTH INSURANCE | Admitting: Internal Medicine

## 2019-12-17 ENCOUNTER — Encounter: Payer: Self-pay | Admitting: Internal Medicine

## 2019-12-17 ENCOUNTER — Other Ambulatory Visit: Payer: Self-pay

## 2019-12-17 VITALS — BP 153/88 | HR 55 | Temp 98.9°F | Resp 12 | Ht 68.0 in | Wt 176.0 lb

## 2019-12-17 DIAGNOSIS — K648 Other hemorrhoids: Secondary | ICD-10-CM | POA: Diagnosis not present

## 2019-12-17 DIAGNOSIS — R194 Change in bowel habit: Secondary | ICD-10-CM | POA: Diagnosis present

## 2019-12-17 DIAGNOSIS — D123 Benign neoplasm of transverse colon: Secondary | ICD-10-CM | POA: Diagnosis not present

## 2019-12-17 DIAGNOSIS — K229 Disease of esophagus, unspecified: Secondary | ICD-10-CM

## 2019-12-17 DIAGNOSIS — K6289 Other specified diseases of anus and rectum: Secondary | ICD-10-CM | POA: Diagnosis not present

## 2019-12-17 DIAGNOSIS — R1319 Other dysphagia: Secondary | ICD-10-CM

## 2019-12-17 DIAGNOSIS — K5730 Diverticulosis of large intestine without perforation or abscess without bleeding: Secondary | ICD-10-CM | POA: Diagnosis not present

## 2019-12-17 DIAGNOSIS — K219 Gastro-esophageal reflux disease without esophagitis: Secondary | ICD-10-CM

## 2019-12-17 DIAGNOSIS — R110 Nausea: Secondary | ICD-10-CM

## 2019-12-17 DIAGNOSIS — K2970 Gastritis, unspecified, without bleeding: Secondary | ICD-10-CM | POA: Diagnosis not present

## 2019-12-17 MED ORDER — SODIUM CHLORIDE 0.9 % IV SOLN: 500.0000 mL | Freq: Once | INTRAVENOUS | Status: DC

## 2019-12-17 NOTE — Progress Notes (Signed)
Vitals-CW ?

## 2019-12-17 NOTE — Op Note (Signed)
[facility] Patient Name: Albert Brown Procedure Date: 12/17/2019 10:32 AM MRN: [id number] Endoscopist: Jerene Bears , MD Age: 64 Referring MD:  Date of Birth: 04/08/1955 Gender: Male Account #: [id number] Procedure:                Colonoscopy Indications:              Change in bowel habits Medicines:                Monitored Anesthesia Care Procedure:                Pre-Anesthesia Assessment:                           - Prior to the procedure, a History and Physical                            was performed, and patient medications and                            allergies were reviewed. The patient's tolerance of                            previous anesthesia was also reviewed. The risks                            and benefits of the procedure and the sedation                            options and risks were discussed with the patient.                            All questions were answered, and informed consent                            was obtained. Prior Anticoagulants: The patient has                            taken no previous anticoagulant or antiplatelet                            agents. ASA Grade Assessment: III - A patient with                            severe systemic disease. After reviewing the risks                            and benefits, the patient was deemed in                            satisfactory condition to undergo the procedure.                           After obtaining informed consent, the colonoscope  was passed under direct vision. Throughout the                            procedure, the patient's blood pressure, pulse, and                            oxygen saturations were monitored continuously. The                            Colonoscope was introduced through the anus and                            advanced to the terminal ileum. The Colonoscope was                            introduced through the and advanced  to the. The                            colonoscopy was performed without difficulty. The                            patient tolerated the procedure well. The quality                            of the bowel preparation was excellent. The                            terminal ileum, ileocecal valve, appendiceal                            orifice, and rectum were photographed. Scope In: 11:05:51 AM Scope Out: 11:24:15 AM Scope Withdrawal Time: 0 hours 14 minutes 14 seconds  Total Procedure Duration: 0 hours 18 minutes 24 seconds  Findings:                 The digital rectal exam was normal.                           The terminal ileum appeared normal.                           Two sessile polyps were found in the transverse                            colon. The polyps were 3 to 4 mm in size. These                            polyps were removed with a cold snare. Resection                            and retrieval were complete.                           Multiple small-mouthed diverticula were found in  the sigmoid colon.                           Localized inflammation characterized by congestion                            (edema) and granularity was found in the distal                            rectum located just above the dentate line, query                            prolapse change. Biopsies were taken with a cold                            forceps for histology.                           Internal hemorrhoids were found during                            retroflexion. The hemorrhoids were medium-sized. Complications:            No immediate complications. Estimated Blood Loss:     Estimated blood loss was minimal. Impression:               - The examined portion of the ileum was normal.                           - Two 3 to 4 mm polyps in the transverse colon,                            removed with a cold snare. Resected and retrieved.                            - Diverticulosis in the sigmoid colon.                           - Localized inflammation was found in the distal                            rectum. Biopsied.                           - Internal hemorrhoids. Recommendation:           - Patient has a contact number available for                            emergencies. The signs and symptoms of potential                            delayed complications were discussed with the                            patient. Return to normal activities tomorrow.  Written discharge instructions were provided to the                            patient.                           - Resume previous diet.                           - Continue present medications.                           - Await pathology results.                           - Repeat colonoscopy is recommended. The                            colonoscopy date will be determined after pathology                            results from today's exam become available for                            review. Jerene Bears, MD 12/17/2019 11:36:51 AM This report has been signed electronically.

## 2019-12-17 NOTE — Patient Instructions (Signed)
Discharge instructions given. Handouts on polyps,diverticulosis,hemorrhoids,Gastritis and a dilatation diet. Resume previous medications. YOU HAD AN ENDOSCOPIC PROCEDURE TODAY AT [facility]:   Refer to the procedure report that was given to you for any specific questions about what was found during the examination.  If the procedure report does not answer your questions, please call your gastroenterologist to clarify.  If you requested that your care partner not be given the details of your procedure findings, then the procedure report has been included in a sealed envelope for you to review at your convenience later.  YOU SHOULD EXPECT: Some feelings of bloating in the abdomen. Passage of more gas than usual.  Walking can help get rid of the air that was put into your GI tract during the procedure and reduce the bloating. If you had a lower endoscopy (such as a colonoscopy or flexible sigmoidoscopy) you may notice spotting of blood in your stool or on the toilet paper. If you underwent a bowel prep for your procedure, you may not have a normal bowel movement for a few days.  Please Note:  You might notice some irritation and congestion in your nose or some drainage.  This is from the oxygen used during your procedure.  There is no need for concern and it should clear up in a day or so.  SYMPTOMS TO REPORT IMMEDIATELY:   Following lower endoscopy (colonoscopy or flexible sigmoidoscopy):  Excessive amounts of blood in the stool  Significant tenderness or worsening of abdominal pains  Swelling of the abdomen that is new, acute  Fever of 100F or higher   Following upper endoscopy (EGD)  Vomiting of blood or coffee ground material  New chest pain or pain under the shoulder blades  Painful or persistently difficult swallowing  New shortness of breath  Fever of 100F or higher  Black, tarry-looking stools  For urgent or emergent issues, a gastroenterologist can be reached at  any hour by calling [phone]. Do not use MyChart messaging for urgent concerns.    DIET:  We do recommend a small meal at first, but then you may proceed to your regular diet.  Drink plenty of fluids but you should avoid alcoholic beverages for 24 hours.  ACTIVITY:  You should plan to take it easy for the rest of today and you should NOT DRIVE or use heavy machinery until tomorrow (because of the sedation medicines used during the test).    FOLLOW UP: Our staff will call the number listed on your records 48-72 hours following your procedure to check on you and address any questions or concerns that you may have regarding the information given to you following your procedure. If we do not reach you, we will leave a message.  We will attempt to reach you two times.  During this call, we will ask if you have developed any symptoms of COVID 19. If you develop any symptoms (ie: fever, flu-like symptoms, shortness of breath, cough etc.) before then, please call [phone].  If you test positive for Covid 19 in the 2 weeks post procedure, please call and report this information to [place].    If any biopsies were taken you will be contacted by phone or by letter within the next 1-3 weeks.  Please call us at [phone] if you have not heard about the biopsies in 3 weeks.    SIGNATURES/CONFIDENTIALITY: You and/or your care partner have signed paperwork which will be entered into your electronic medical record.  These signatures attest to the fact that that the information above on your After Visit Summary has been reviewed and is understood.  Full responsibility of the confidentiality of this discharge information lies with you and/or your care-partner.

## 2019-12-17 NOTE — Progress Notes (Signed)
Called to room to assist during endoscopic procedure.  Patient ID and intended procedure confirmed with present staff. Received instructions for my participation in the procedure from the performing physician.  

## 2019-12-17 NOTE — Op Note (Signed)
[place] Patient Name: Albert Brown Procedure Date: 12/17/2019 10:32 AM MRN: [id number] Endoscopist: Jerene Bears , MD Age: 64 Referring MD:  Date of Birth: November 05, 1955 Gender: Male Account #: [id number] Procedure:                Upper GI endoscopy Indications:              Dysphagia, Gastro-esophageal reflux disease, Nausea Medicines:                Monitored Anesthesia Care Procedure:                Pre-Anesthesia Assessment:                           - Prior to the procedure, a History and Physical                            was performed, and patient medications and                            allergies were reviewed. The patient's tolerance of                            previous anesthesia was also reviewed. The risks                            and benefits of the procedure and the sedation                            options and risks were discussed with the patient.                            All questions were answered, and informed consent                            was obtained. Prior Anticoagulants: The patient has                            taken no previous anticoagulant or antiplatelet                            agents. ASA Grade Assessment: II - A patient with                            mild systemic disease. After reviewing the risks                            and benefits, the patient was deemed in                            satisfactory condition to undergo the procedure.                           After obtaining informed consent, the endoscope was  passed under direct vision. Throughout the                            procedure, the patient's blood pressure, pulse, and                            oxygen saturations were monitored continuously. The                            Endoscope was introduced through the mouth, and                            advanced to the second part of duodenum. The upper                            GI  endoscopy was accomplished without difficulty.                            The patient tolerated the procedure well. Scope In: Scope Out: Findings:                 The Z-line was irregular and was found 39 cm from                            the incisors. Biopsies were taken with a cold                            forceps for histology.                           A low-grade of narrowing Schatzki ring was found at                            the gastroesophageal junction. A TTS dilator was                            passed through the scope. Dilation with a 16-17-18                            mm balloon dilator was performed to 16 mm. The                            dilation site was examined and showed moderate                            mucosal disruption.                           A 2 cm hiatal hernia was present.                           Moderate inflammation characterized by erosions,  erythema and granularity was found in the gastric                            body and in the gastric antrum. Biopsies were taken                            with a cold forceps for histology and Helicobacter                            pylori testing.                           The examined duodenum was normal. Complications:            No immediate complications. Estimated Blood Loss:     Estimated blood loss was minimal. Impression:               - Z-line irregular, 39 cm from the incisors.                            Biopsied.                           - Low-grade of narrowing Schatzki ring. Dilated to                            16 mm with balloon.                           - 2 cm hiatal hernia.                           - Gastritis. Biopsied.                           - Normal examined duodenum. Recommendation:           - Patient has a contact number available for                            emergencies. The signs and symptoms of potential                            delayed  complications were discussed with the                            patient. Return to normal activities tomorrow.                            Written discharge instructions were provided to the                            patient.                           - Resume previous diet.                           -  Continue present medications. Continue                            pantoprazole 40 mg daily.                           - Await pathology results.                           - Office follow-up in 2-3 months. Jerene Bears, MD 12/17/2019 11:33:14 AM This report has been signed electronically.

## 2019-12-17 NOTE — Progress Notes (Signed)
Report to PACU, RN, vss, BBS= Clear.  

## 2019-12-21 ENCOUNTER — Telehealth: Payer: Self-pay

## 2019-12-21 NOTE — Telephone Encounter (Signed)
°  Follow up Call-  Call back number 12/17/2019  Post procedure Call Back phone  # [phone]  Permission to leave phone message Yes  Some recent data might be hidden     Patient questions:  Do you have a fever, pain , or abdominal swelling? No. Pain Score  0 *  Have you tolerated food without any problems? Yes.    Have you been able to return to your normal activities? Yes.    Do you have any questions about your discharge instructions: Diet   No. Medications  No. Follow up visit  No.  Do you have questions or concerns about your Care? No.  Actions: * If pain score is 4 or above: No action needed, pain <4.   1. Have you developed a fever since your procedure? no  2.   Have you had an respiratory symptoms (SOB or cough) since your procedure? no  3.   Have you tested positive for COVID 19 since your procedure no  4.   Have you had any family members/close contacts diagnosed with the COVID 19 since your procedure?  no   If yes to any of these questions please route to Joylene John, RN and Joella Prince, RN

## 2019-12-30 ENCOUNTER — Other Ambulatory Visit: Payer: Self-pay

## 2019-12-30 DIAGNOSIS — K2970 Gastritis, unspecified, without bleeding: Secondary | ICD-10-CM

## 2019-12-30 DIAGNOSIS — R110 Nausea: Secondary | ICD-10-CM

## 2020-02-22 ENCOUNTER — Encounter: Payer: Self-pay | Admitting: *Deleted

## 2020-02-24 ENCOUNTER — Ambulatory Visit: Payer: PRIVATE HEALTH INSURANCE | Admitting: Internal Medicine

## 2020-03-28 ENCOUNTER — Telehealth: Payer: Self-pay | Admitting: Cardiology

## 2020-03-28 NOTE — Telephone Encounter (Signed)
Spoke with pt who reports for the past few days he has been having some chest pain that lasts a few minutes, resolves and it's own.  He denies any n/v, SOB, diaphoresis with the discomfort.  He reports can not really describe it any further.  He has not had to use his SL Ntg for it.  He denies any other s/s.  appt scheduled for further evaluation.  Advised to [place] SL ntg as needed and reviewed ER precautions.  Pt states understanding and will c/b prior to appt if necessary.  He was grateful for the c/b and appt.

## 2020-03-28 NOTE — Telephone Encounter (Signed)
Pt c/o of Chest Pain: STAT if CP now or developed within 24 hours  1. Are you having CP right now? no  2. Are you experiencing any other symptoms (ex. SOB, nausea, vomiting, sweating)? no  3. How long have you been experiencing CP? For a few days   4. Is your CP continuous or coming and going? Come and go  5. Have you taken Nitroglycerin? No   Patient states he has been having chest pain. He states he it has been happening for a few days. Please advise. ?

## 2020-04-10 ENCOUNTER — Ambulatory Visit: Payer: PRIVATE HEALTH INSURANCE | Admitting: Cardiology

## 2020-04-22 ENCOUNTER — Other Ambulatory Visit: Payer: Self-pay | Admitting: Cardiology

## 2020-04-22 DIAGNOSIS — E785 Hyperlipidemia, unspecified: Secondary | ICD-10-CM

## 2020-04-22 DIAGNOSIS — Z9861 Coronary angioplasty status: Secondary | ICD-10-CM

## 2020-04-22 DIAGNOSIS — I2510 Atherosclerotic heart disease of native coronary artery without angina pectoris: Secondary | ICD-10-CM

## 2020-04-24 ENCOUNTER — Telehealth: Payer: Self-pay | Admitting: Cardiology

## 2020-04-24 NOTE — Telephone Encounter (Signed)
Called pt to inform him that his medication was already waiting for him to pick it up. I advised the pt that if he has any other problems, questions or concerns, to give our office a call. Pt verbalized understanding.

## 2020-04-24 NOTE — Telephone Encounter (Signed)
*  STAT* If patient is at the pharmacy, call can be transferred to refill team.   1. Which medications need to be refilled? (please list name of each medication and dose if known)  simvastatin (ZOCOR) 20 MG tablet  2. Which pharmacy/location (including street and city if local pharmacy) is medication to be sent to? [id number] - [address], [place]  3. Do they need a 30 day or 90 day supply? 90 day supply  Patient states the pharmacy will not refill his medication due to the note attached. He is scheduled to see Dr. Marlou Porch on 06/01/20 and he is requesting a supply that will hold him over at least until his appointment.

## 2020-05-25 ENCOUNTER — Other Ambulatory Visit: Payer: Self-pay

## 2020-05-25 ENCOUNTER — Ambulatory Visit: Payer: PRIVATE HEALTH INSURANCE | Admitting: Podiatry

## 2020-05-25 ENCOUNTER — Encounter: Payer: Self-pay | Admitting: Podiatry

## 2020-05-25 ENCOUNTER — Ambulatory Visit (INDEPENDENT_AMBULATORY_CARE_PROVIDER_SITE_OTHER): Payer: PRIVATE HEALTH INSURANCE

## 2020-05-25 DIAGNOSIS — M778 Other enthesopathies, not elsewhere classified: Secondary | ICD-10-CM

## 2020-05-25 DIAGNOSIS — D169 Benign neoplasm of bone and articular cartilage, unspecified: Secondary | ICD-10-CM | POA: Diagnosis not present

## 2020-05-25 DIAGNOSIS — Z9889 Other specified postprocedural states: Secondary | ICD-10-CM

## 2020-05-25 NOTE — Progress Notes (Signed)
Subjective:   Patient ID: Albert Brown, male   DOB: 65 y.o.   MRN: [id number]   HPI Patient presents stating he had some tingling on the top of his right foot last week that he was concerned about has had bunion surgery which is doing well but is slightly concerned about the position of his big toe.  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  Patient will just keep an eye and if symptoms were to get worse he is to come back in to be checked  X-rays indicate fixation of first MPJ adequate mild diminishment of the joint space but overall doing well and round

## 2020-06-01 ENCOUNTER — Ambulatory Visit: Payer: PRIVATE HEALTH INSURANCE | Admitting: Cardiology

## 2020-06-01 ENCOUNTER — Encounter: Payer: Self-pay | Admitting: Cardiology

## 2020-06-01 ENCOUNTER — Encounter: Payer: Self-pay | Admitting: *Deleted

## 2020-06-01 ENCOUNTER — Other Ambulatory Visit: Payer: Self-pay

## 2020-06-01 VITALS — BP 130/80 | HR 67 | Ht 68.0 in | Wt 174.0 lb

## 2020-06-01 DIAGNOSIS — I2510 Atherosclerotic heart disease of native coronary artery without angina pectoris: Secondary | ICD-10-CM

## 2020-06-01 DIAGNOSIS — R079 Chest pain, unspecified: Secondary | ICD-10-CM | POA: Diagnosis not present

## 2020-06-01 MED ORDER — ROSUVASTATIN CALCIUM 20 MG PO TABS: 20.0000 mg | ORAL_TABLET | Freq: Every day | ORAL | 3 refills | Status: DC

## 2020-06-01 NOTE — Patient Instructions (Addendum)
Medication Instructions:  Please discontinue your Simvastatin and start Crestor 20 mg a day.  *If you need a refill on your cardiac medications before your next appointment, please call your pharmacy*  Lab:  Please have blood work in 3 months (Lipid/ALT)  Testing/Procedures: Your physician has requested that you have an echocardiogram. Echocardiography is a painless test that uses sound waves to create images of your heart. It provides your doctor with information about the size and shape of your heart and how well your heart's chambers and valves are working. This procedure takes approximately one hour. There are no restrictions for this procedure.  Your physician has requested that you have a lexiscan myoview. For further information please visit [email or web address]. Please follow instruction sheet, as given.  Follow-Up: At [facility], you and your health needs are our priority.  As part of our continuing mission to provide you with exceptional heart care, we have created designated Provider Care Teams.  These Care Teams include your primary Cardiologist (physician) and Advanced Practice Providers (APPs -  Physician Assistants and Nurse Practitioners) who all work together to provide you with the care you need, when you need it.  We recommend signing up for the patient portal called "MyChart".  Sign up information is provided on this After Visit Summary.  MyChart is used to connect with patients for Virtual Visits (Telemedicine).  Patients are able to view lab/test results, encounter notes, upcoming appointments, etc.  Non-urgent messages can be sent to your provider as well.   To learn more about what you can do with MyChart, go to [email or web address].    Your next appointment:   Follow up will be based on the results of the above testing.  Thank you for choosing [facility]!!

## 2020-06-01 NOTE — Progress Notes (Signed)
Cardiology Office Note:    Date:  06/01/2020   ID:  RAQUAN IANNONE, DOB 10/03/1955, MRN [id number]  PCP:  Maudie Mercury, [place]  Cardiologist:  Candee Furbish, MD  Advanced Practice Provider:  No care team member to display Electrophysiologist:  None       Referring MD: Maudie Mercury, MD     History of Present Illness:    Albert Brown is a 65 y.o. male here for follow-up of chest pain.  He called in early February reporting chest discomfort lasting a few minutes duration resolving on its own.  No shortness of breath diaphoresis nausea or vomiting.  Minutes of chest pain at times.   Fatigue on lisinopril.  Nasal drip  He has coronary artery disease with prior inferior ST elevation myocardial infarction in 2015-DES to the mid RCA.  Also has hypertension hyperlipidemia HIV GERD.  In the past had had sporadic episodes of upper chest pain below the clavicles which was nonexertional.  Overall he does state that he will have this intermittent chest discomfort that concerns him.  Sometimes last minutes duration.   Past Medical History:  Diagnosis Date  . ALLERGIC RHINITIS   . Allergic sinusitis   . Anxiety state, unspecified   . CAD S/P percutaneous coronary angioplasty 08/08/2013   mRCA  Xience Alpine DES 2.75 mm x 33 mm (3.0 mm)  . Cerumen impaction, right   . DEPRESSION   . Diarrhea   . Erectile dysfunction   . EXTERNAL HEMORRHOIDS WITHOUT MENTION COMP   . GERD   . Hiatal hernia   . HIV DISEASE   . HIV infection ([place])   . HYPOGONADISM   . Internal hemorrhoids   . Non-ST elevated myocardial infarction (non-STEMI) ([place]) 08/08/2013   SubAcute100% RCA -- PCI; Echo: EF 55-605, Normal LV Size & Function - ~mild HK of Inferior Myocardium with mildly reduced RV function.  Normal Valve function  . Periodontal disease   . PERIPHERAL NEUROPATHY   . PHARYNGITIS, RECURRENT   . Pterygium of left eye   . Tubular adenoma of colon   . Urinary  frequency   . Urticaria   . VERTIGO     Past Surgical History:  Procedure Laterality Date  . LEFT HEART CATHETERIZATION WITH CORONARY ANGIOGRAM N/A 08/08/2013   Procedure: LEFT HEART CATHETERIZATION WITH CORONARY ANGIOGRAM;  Surgeon: [name], MD;  Location[facility] C[facility] LAB;  Service: Cardiovascular;  Laterality: N/A;  . PERCUTANEOUS CORONARY STENT INTERVENTION (PCI-S)  08/08/2013   mRCA 100% --> Xience Alpine DES 2.75 mm x 33 mm --> 3.0 mm    Current Medications: No outpatient medications have been marked as taking for the 06/01/20 encounter (Appointment) with Jerline Pain, MD.     Allergies:   Brilinta [ticagrelor], Codeine, Bactrim [sulfamethoxazole-trimethoprim], Ciprofloxacin, Dapsone, Pravastatin, and Sulfamethoxazole-trimethoprim   Social History   Socioeconomic History  . Marital status: Single    Spouse name: Not on file  . Number of children: Not on file  . Years of education: Not on file  . Highest education level: Not on file  Occupational History  . Not on file  Tobacco Use  . Smoking status: Former Smoker    Types: Cigarettes    Quit date: 03/02/2007    Years since quitting: 13.2  . Smokeless tobacco: Never Used  . Tobacco comment: pt. no longer smokes  Vaping Use  . Vaping Use: Never used  Substance and Sexual Activity  .  Alcohol use: Yes    Comment: sometimes.  . Drug use: Not Currently    Types: Marijuana    Comment: daily   . Sexual activity: Yes    Partners: Male  Other Topics Concern  . Not on file  Social History Narrative  . Not on file   Social Determinants of Health   Financial Resource Strain: Not on file  Food Insecurity: Not on file  Transportation Needs: Not on file  Physical Activity: Not on file  Stress: Not on file  Social Connections: Not on file     Family History: The patient's family history includes Cancer in his mother; Diabetes in his brother; Osteosarcoma in his father; Prostate cancer in his brother and father. There  is no history of Colon cancer, Esophageal cancer, Liver cancer, Rectal cancer, or Stomach cancer.  ROS:   Please see the history of present illness.     All other systems reviewed and are negative.  EKGs/Labs/Other Studies Reviewed:    The following studies were reviewed today:   Echo EF normal  Xience Alpine 2.75 x 33 mm drug-eluting stent.  Exercise treadmill test in 2019 showed normal capacity 7 minutes no ischemia   EKG:  EKG is  ordered today.  The ekg ordered today demonstrates SR PAC 67 bpm  Recent Labs: 07/14/2019: TSH <0.005 09/16/2019: ALT 26; BUN 23; Creat 1.15; Hemoglobin 16.2; Platelets 182; Potassium 4.9; Sodium 140  Recent Lipid Panel    Component Value Date/Time   CHOL 161 09/16/2019 1447   CHOL 126 04/01/2019 1134   TRIG 101 09/16/2019 1447   HDL 53 09/16/2019 1447   HDL 44 04/01/2019 1134   CHOLHDL 3.0 09/16/2019 1447   VLDL 29 05/09/2016 1700   LDLCALC 89 09/16/2019 1447     Risk Assessment/Calculations:      Physical Exam:    VS:  There were no vitals taken for this visit.    Wt Readings from Last 3 Encounters:  12/17/19 176 lb (79.8 kg)  10/21/19 176 lb (79.8 kg)  09/20/19 177 lb (80.3 kg)     GEN:  Well nourished, well developed in no acute distress HEENT: Normal NECK: No JVD; No carotid bruits LYMPHATICS: No lymphadenopathy CARDIAC: RRR, no murmurs, rubs, gallops RESPIRATORY:  Clear to auscultation without rales, wheezing or rhonchi  ABDOMEN: Soft, non-tender, non-distended MUSCULOSKELETAL:  No edema; No deformity  SKIN: Warm and dry NEUROLOGIC:  Alert and oriented x 3 PSYCHIATRIC:  Normal affect   ASSESSMENT:    No diagnosis found. PLAN:    In order of problems listed above:  Coronary artery disease -Prior inferior MI, DES as above.  Previously normal exercise treadmill test in 2019.  Continue with goal-directed medical therapy.  In the past has been intolerant to statins, however he seems to tolerate simvastatin. -We  will check an echo and stress test.  Sometimes he will feel chest discomfort,  Hyperlipidemia -With CAD, LDL goal less than 70.  Continuing with simvastatin.  Had issues with statins in the past.  Essential hypertension -Has had some difficulties with medications in the past.  Reluctant to utilize amlodipine.  Was on lisinopril at 1 point but wished to be switched.  We gave him losartan back in February 2021.   Shared Decision Making/Informed Consent The risks [chest pain, shortness of breath, cardiac arrhythmias, dizziness, blood pressure fluctuations, myocardial infarction, stroke/transient ischemic attack, nausea, vomiting, allergic reaction, radiation exposure, metallic taste sensation and life-threatening complications (estimated to be 1 in 10,000)], benefits (  risk stratification, diagnosing coronary artery disease, treatment guidance) and alternatives of a nuclear stress test were discussed in detail with Mr. Bagnell and he agrees to proceed.       Medication Adjustments/Labs and Tests Ordered: Current medicines are reviewed at length with the patient today.  Concerns regarding medicines are outlined above.  No orders of the defined types were placed in this encounter.  No orders of the defined types were placed in this encounter.   There are no Patient Instructions on file for this visit.   Signed, Candee Furbish, MD  06/01/2020 6:52 AM    [facility]

## 2020-06-02 ENCOUNTER — Telehealth: Payer: Self-pay

## 2020-06-02 MED ORDER — ATORVASTATIN CALCIUM 40 MG PO TABS: 40.0000 mg | ORAL_TABLET | Freq: Every day | ORAL | 3 refills | Status: DC

## 2020-06-02 NOTE — Telephone Encounter (Signed)
Pt called stating that he can't afford the Rosuvastatin. He was switched from Simvastatin at yesterday's office visit. His out of pocket cost for the Rosuvastatin is $286 for a 3 mth supply.  Please call pt to discuss options.

## 2020-06-02 NOTE — Telephone Encounter (Signed)
Reviewed with Dr Marlou Porch who gave orders to change to Atorvastatin 40 mg daily. Pt is aware and RX sent into pharmacy.

## 2020-06-02 NOTE — Addendum Note (Signed)
Addended by: Shellia Cleverly on: 06/02/2020 12:57 PM   Modules accepted: Orders

## 2020-06-13 ENCOUNTER — Other Ambulatory Visit: Payer: Self-pay | Admitting: Cardiology

## 2020-06-13 DIAGNOSIS — I2510 Atherosclerotic heart disease of native coronary artery without angina pectoris: Secondary | ICD-10-CM

## 2020-06-13 DIAGNOSIS — E785 Hyperlipidemia, unspecified: Secondary | ICD-10-CM

## 2020-06-13 DIAGNOSIS — Z9861 Coronary angioplasty status: Secondary | ICD-10-CM

## 2020-06-15 ENCOUNTER — Telehealth: Payer: Self-pay | Admitting: Cardiology

## 2020-06-15 NOTE — Telephone Encounter (Signed)
Pt c/o medication issue:  1. Name of Medication:  Rosuvastatin crestor   2. How are you currently taking this medication (dosage and times per day)? Not currently taking  3. Are you having a reaction (difficulty breathing--STAT)? no  4. What is your medication issue? Patient states the crestor and rosuvastatin are too expensive. He states crestor was $286 and rosuvastatin was $43. He would like to go back to the simvastatin.

## 2020-06-15 NOTE — Telephone Encounter (Signed)
RN spoke to patient regarding switching back to simvastatin due to increase cost of Crestor and rosuvastatin. RN advised that Dr. Marlou Porch is out of the office this week but I would send a message for him to review when he returns. Patient verbalized understanding.

## 2020-06-16 NOTE — Telephone Encounter (Signed)
Would rather on atorvastatin 40mg  PO QD then if Crestor too expensive Candee Furbish, MD

## 2020-06-19 NOTE — Telephone Encounter (Signed)
Called and left message for pt to c/b to discuss atorvastatin.  Advised if he prefers, he can send a message back through MyChart about his pharmacy.

## 2020-06-26 NOTE — Telephone Encounter (Signed)
Pt has not called back to discuss atorvastatin.  His medication list demonstrates he is taking atorvastatin.   Will close this encounter and await a return call from patient.

## 2020-06-28 ENCOUNTER — Telehealth (HOSPITAL_COMMUNITY): Payer: Self-pay | Admitting: *Deleted

## 2020-06-28 NOTE — Telephone Encounter (Signed)
Patient given detailed instructions per Myocardial Perfusion Study Information Sheet for the test on 07/05/20 at 0715. Patient notified to arrive 15 minutes early and that it is imperative to arrive on time for appointment to keep from having the test rescheduled.  If you need to cancel or reschedule your appointment, please call the office within 24 hours of your appointment. . Patient verbalized understanding.Albert Brown, [place] Patient does not use his mychart

## 2020-07-05 ENCOUNTER — Other Ambulatory Visit (HOSPITAL_COMMUNITY): Payer: PRIVATE HEALTH INSURANCE

## 2020-07-05 ENCOUNTER — Other Ambulatory Visit: Payer: Self-pay

## 2020-07-05 ENCOUNTER — Ambulatory Visit (HOSPITAL_COMMUNITY): Payer: No Typology Code available for payment source | Attending: Cardiovascular Disease

## 2020-07-05 DIAGNOSIS — R079 Chest pain, unspecified: Secondary | ICD-10-CM | POA: Diagnosis not present

## 2020-07-05 DIAGNOSIS — I2510 Atherosclerotic heart disease of native coronary artery without angina pectoris: Secondary | ICD-10-CM | POA: Diagnosis present

## 2020-07-05 LAB — MYOCARDIAL PERFUSION IMAGING
LV dias vol: 91 mL (ref 62–150)
LV sys vol: 37 mL
Peak HR: 103 {beats}/min
Rest HR: 58 {beats}/min
SDS: 2
SRS: 1
SSS: 3
TID: 0.98

## 2020-07-05 MED ORDER — REGADENOSON 0.4 MG/5ML IV SOLN
0.4000 mg | Freq: Once | INTRAVENOUS | Status: AC
  Administered 2020-07-05: 0.4 mg via INTRAVENOUS

## 2020-07-05 MED ORDER — TECHNETIUM TC 99M TETROFOSMIN IV KIT
10.6000 | PACK | Freq: Once | INTRAVENOUS | Status: AC | PRN
  Administered 2020-07-05: 10.6 via INTRAVENOUS
  Filled 2020-07-05: qty 11

## 2020-07-05 MED ORDER — TECHNETIUM TC 99M TETROFOSMIN IV KIT
31.4000 | PACK | Freq: Once | INTRAVENOUS | Status: AC | PRN
Start: 2020-07-05 — End: 2020-07-05
  Administered 2020-07-05: 31.4 via INTRAVENOUS
  Filled 2020-07-05: qty 32

## 2020-07-17 ENCOUNTER — Encounter: Payer: Self-pay | Admitting: Infectious Diseases

## 2020-07-31 ENCOUNTER — Other Ambulatory Visit: Payer: No Typology Code available for payment source

## 2020-07-31 ENCOUNTER — Other Ambulatory Visit: Payer: Self-pay

## 2020-07-31 DIAGNOSIS — B20 Human immunodeficiency virus [HIV] disease: Secondary | ICD-10-CM

## 2020-08-01 LAB — T-HELPER CELL (CD4) - (RCID CLINIC ONLY)
CD4 % Helper T Cell: 33 % (ref 33–65)
CD4 T Cell Abs: 513 /uL (ref 400–1790)

## 2020-08-02 LAB — COMPLETE METABOLIC PANEL WITH GFR
AG Ratio: 1.9 (calc) (ref 1.0–2.5)
ALT: 19 U/L (ref 9–46)
AST: 17 U/L (ref 10–35)
Albumin: 4.6 g/dL (ref 3.6–5.1)
Alkaline phosphatase (APISO): 73 U/L (ref 35–144)
BUN: 15 mg/dL (ref 7–25)
CO2: 27 mmol/L (ref 20–32)
Calcium: 9.6 mg/dL (ref 8.6–10.3)
Chloride: 106 mmol/L (ref 98–110)
Creat: 1.25 mg/dL (ref 0.70–1.25)
GFR, Est African American: 71 mL/min/{1.73_m2} (ref >=60)
GFR, Est Non African American: 61 mL/min/{1.73_m2} (ref >=60)
Globulin: 2.4 g/dL (calc) (ref 1.9–3.7)
Glucose, Bld: 84 mg/dL (ref 65–99)
Potassium: 4.5 mmol/L (ref 3.5–5.3)
Sodium: 141 mmol/L (ref 135–146)
Total Bilirubin: 0.5 mg/dL (ref 0.2–1.2)
Total Protein: 7 g/dL (ref 6.1–8.1)

## 2020-08-02 LAB — HIV-1 RNA QUANT-NO REFLEX-BLD
HIV 1 RNA Quant: NOT DETECTED Copies/mL
HIV-1 RNA Quant, Log: NOT DETECTED Log cps/mL

## 2020-08-22 ENCOUNTER — Encounter: Payer: No Typology Code available for payment source | Admitting: Infectious Diseases

## 2020-08-29 ENCOUNTER — Encounter: Payer: Self-pay | Admitting: *Deleted

## 2020-08-29 ENCOUNTER — Other Ambulatory Visit: Payer: Self-pay

## 2020-08-29 ENCOUNTER — Ambulatory Visit: Payer: No Typology Code available for payment source

## 2020-08-31 ENCOUNTER — Other Ambulatory Visit: Payer: PRIVATE HEALTH INSURANCE

## 2020-09-07 ENCOUNTER — Ambulatory Visit: Payer: No Typology Code available for payment source

## 2020-09-07 ENCOUNTER — Ambulatory Visit (INDEPENDENT_AMBULATORY_CARE_PROVIDER_SITE_OTHER): Payer: No Typology Code available for payment source | Admitting: Infectious Diseases

## 2020-09-07 ENCOUNTER — Encounter: Payer: Self-pay | Admitting: Infectious Diseases

## 2020-09-07 ENCOUNTER — Other Ambulatory Visit: Payer: Self-pay

## 2020-09-07 VITALS — BP 155/85 | HR 60 | Temp 98.2°F | Ht 68.0 in | Wt 179.0 lb

## 2020-09-07 DIAGNOSIS — Z79899 Other long term (current) drug therapy: Secondary | ICD-10-CM

## 2020-09-07 DIAGNOSIS — H11002 Unspecified pterygium of left eye: Secondary | ICD-10-CM | POA: Diagnosis not present

## 2020-09-07 DIAGNOSIS — F32A Depression, unspecified: Secondary | ICD-10-CM | POA: Diagnosis not present

## 2020-09-07 DIAGNOSIS — Z113 Encounter for screening for infections with a predominantly sexual mode of transmission: Secondary | ICD-10-CM | POA: Diagnosis not present

## 2020-09-07 DIAGNOSIS — B20 Human immunodeficiency virus [HIV] disease: Secondary | ICD-10-CM | POA: Diagnosis not present

## 2020-09-07 DIAGNOSIS — K5792 Diverticulitis of intestine, part unspecified, without perforation or abscess without bleeding: Secondary | ICD-10-CM

## 2020-09-07 DIAGNOSIS — I214 Non-ST elevation (NSTEMI) myocardial infarction: Secondary | ICD-10-CM

## 2020-09-07 MED ORDER — ODEFSEY 200-25-25 MG PO TABS: 1.0000 | ORAL_TABLET | Freq: Every day | ORAL | 3 refills | Status: DC

## 2020-09-07 NOTE — Assessment & Plan Note (Signed)
Will have him seen by Marcie Bal

## 2020-09-07 NOTE — Progress Notes (Signed)
Subjective:    Patient ID: Albert Brown, male  DOB: March 10, 1955, 65 y.o.        MRN: [id number]   HPI 65 yo M with HIV+ (on triumeq --> complera--> odefsy---> dovato) He presented to [facility] on 08/07/2013 and was found to have acute total occlusion of the mid RCA. This was successfully treated with PCI plus a drug-eluting stent. He had well-preserved left ventricular systolic function (EF 61-95%). There was mild basilar to mid inferior hypokinesis.  Currently he had CP today (not sure if muscle related or heart), occas SOB. Last CV f/u was 06-01-20.  No radiation, no diaphoresis with CP.   Since last visit- had EGD and colon. Was started on meatmucil after found to have diverticulitis.  Since starting on dovato, has been extremely fatigued (with work, ADLs).  He had myoview 07-07-20, "low risk".    He was seen in ID on 07-2018 for urticaria. Takes zyrtec since, prn.  Has cats. Mood has been more angry, had a bad attitude, short tempered.  Has been drinking more. Denies SI.   Lost 19# but put back 10# back.   HIV 1 RNA Quant  Date Value  07/31/2020 Not Detected Copies/mL  09/16/2019 <20 NOT DETECTED copies/mL  12/09/2018 <20 DETECTED copies/mL (A)   CD4 T Cell Abs (/uL)  Date Value  07/31/2020 513  09/16/2019 479  12/09/2018 584   Lab Results  Component Value Date   CHOL 161 09/16/2019   HDL 53 09/16/2019   LDLCALC 89 09/16/2019   TRIG 101 09/16/2019   CHOLHDL 3.0 09/16/2019      Health Maintenance  Topic Date Due  . COVID-19 Vaccine (1) Never done  . Hepatitis C Screening  Never done  . Zoster Vaccines- Shingrix (1 of 2) Never done  . Pneumococcal Vaccine 83-93 Years old (3 - PCV) 07/24/2012  . INFLUENZA VACCINE  09/25/2020  . TETANUS/TDAP  09/09/2021  . COLONOSCOPY (Pts 45-27yrs Insurance coverage will need to be confirmed)  12/17/2026  . HIV Screening  Completed  . HPV VACCINES  Aged Out      Review of Systems  Constitutional:  Negative for  chills, fever and weight loss.  Respiratory:  Negative for shortness of breath.   Cardiovascular:  Positive for chest pain.  Gastrointestinal:  Positive for diarrhea (due to stool bulking agents, diverticulitis). Negative for constipation.  Genitourinary:  Negative for dysuria.   Please see HPI. All other systems reviewed and negative.     Objective:  Physical Exam Vitals reviewed.  Constitutional:      General: He is not in acute distress.    Appearance: Normal appearance. He is not toxic-appearing.  HENT:     Mouth/Throat:     Mouth: Mucous membranes are moist.     Pharynx: No oropharyngeal exudate.  Eyes:     Extraocular Movements: Extraocular movements intact.     Pupils: Pupils are equal, round, and reactive to light.      Comments: pterygium  Cardiovascular:     Rate and Rhythm: Normal rate and regular rhythm.  Pulmonary:     Effort: Pulmonary effort is normal.     Breath sounds: Normal breath sounds.  Abdominal:     General: Bowel sounds are normal. There is no distension.     Palpations: Abdomen is soft.     Tenderness: There is no abdominal tenderness.  Musculoskeletal:        General: Normal range of motion.  Cervical back: Normal range of motion and neck supple.     Right lower leg: No edema.     Left lower leg: No edema.  Skin:    General: Skin is warm and dry.  Neurological:     General: No focal deficit present.     Mental Status: He is alert.  Psychiatric:        Mood and Affect: Mood normal.          Assessment & Plan:

## 2020-09-07 NOTE — Assessment & Plan Note (Addendum)
He is doing well.  Will change him back to odefsy Offered/refused condoms.  COVID booster when available.  PNVX at 65 yo rtc in 9 months.

## 2020-09-07 NOTE — Assessment & Plan Note (Signed)
Continue on bulking agents, f/u with GI as needed.

## 2020-09-07 NOTE — Addendum Note (Signed)
Addended by: Safwan Tomei C on: 09/07/2020 03:11 PM   Modules accepted: Orders

## 2020-09-07 NOTE — Assessment & Plan Note (Signed)
Appreciate Dr Camila Li f/u.  With normal myoview in last 6 months, not sure of further w/u.

## 2020-09-07 NOTE — Assessment & Plan Note (Signed)
Will have him seen by optho

## 2020-09-12 ENCOUNTER — Ambulatory Visit: Payer: No Typology Code available for payment source

## 2020-09-14 ENCOUNTER — Ambulatory Visit: Payer: No Typology Code available for payment source

## 2020-09-14 ENCOUNTER — Other Ambulatory Visit: Payer: Self-pay

## 2020-09-20 ENCOUNTER — Other Ambulatory Visit: Payer: Self-pay | Admitting: *Deleted

## 2020-09-20 DIAGNOSIS — I10 Essential (primary) hypertension: Secondary | ICD-10-CM

## 2020-09-20 NOTE — Telephone Encounter (Signed)
Last appt 07/14/19. I called pt to schedule an appt - no answer; left message on self-identifies vm to call the office to schedule an appt.

## 2020-09-21 MED ORDER — LISINOPRIL 20 MG PO TABS: 20.0000 mg | ORAL_TABLET | Freq: Every day | ORAL | 0 refills | Status: DC

## 2020-09-26 ENCOUNTER — Encounter: Payer: Self-pay | Admitting: Student

## 2020-09-26 ENCOUNTER — Other Ambulatory Visit: Payer: Self-pay

## 2020-09-26 ENCOUNTER — Ambulatory Visit: Payer: No Typology Code available for payment source | Admitting: Student

## 2020-09-26 DIAGNOSIS — F411 Generalized anxiety disorder: Secondary | ICD-10-CM

## 2020-09-26 DIAGNOSIS — E785 Hyperlipidemia, unspecified: Secondary | ICD-10-CM | POA: Diagnosis not present

## 2020-09-26 MED ORDER — SIMVASTATIN 20 MG PO TABS: 20.0000 mg | ORAL_TABLET | Freq: Every evening | ORAL | 11 refills | Status: DC

## 2020-09-26 MED ORDER — HYDROXYZINE HCL 25 MG PO TABS: 25.0000 mg | ORAL_TABLET | Freq: Three times a day (TID) | ORAL | 1 refills | Status: DC | PRN

## 2020-09-26 NOTE — Patient Instructions (Signed)
Albert Brown it was a pleasure meeting you today.   Regarding your cholesterol medicine we will send the prescription for simvastatin though the atorvastatin or rosuvastatin is preferred. Please use the [email or web address] which could also help with the costs.   For your anxiety, we will start the hydroxyzine (atarax) which you can take whenever you get anxious.   Please let us know if you have any questions or issues.

## 2020-09-27 NOTE — Progress Notes (Signed)
   CC: Refill of lipid-lowering medication  HPI:  Albert Brown is a 65 y.o. male with a past medical history of HIV well-controlled on Odefsey, coronary artery disease status post PCI with drug-eluting stent to the mid RCA 2015, anxiety and depression.  See Encounters Tab for problem based charting.  Past Medical History:  Diagnosis Date   ALLERGIC RHINITIS    Allergic sinusitis    Anxiety state, unspecified    CAD S/P percutaneous coronary angioplasty 08/08/2013   mRCA  Xience Alpine DES 2.75 mm x 33 mm (3.0 mm)   Cerumen impaction, right    DEPRESSION    Diarrhea    Erectile dysfunction    EXTERNAL HEMORRHOIDS WITHOUT MENTION COMP    GERD    Hiatal hernia    HIV DISEASE    HIV infection ([place])    HYPOGONADISM    Internal hemorrhoids    Non-ST elevated myocardial infarction (non-STEMI) ([place]) 08/08/2013   SubAcute100% RCA -- PCI; Echo: EF 55-605, Normal LV Size & Function - ~mild HK of Inferior Myocardium with mildly reduced RV function.  Normal Valve function   Periodontal disease    PERIPHERAL NEUROPATHY    PHARYNGITIS, RECURRENT    Pterygium of left eye    Tubular adenoma of colon    Urinary frequency    Urticaria    VERTIGO    Review of Systems: See Encounters Tab for problem based charting.  Physical Exam:  Vitals:   09/26/20 1524 09/26/20 1528  BP: (!) 161/87 (!) 160/83  Pulse: (!) 57 62  Temp: 98.4 F (36.9 C)   TempSrc: Oral   SpO2: 99%   Weight: 179 lb 1.6 oz (81.2 kg)   Height: '5\' 8"'$  (1.727 m)    Gen: No acute distress Cardiovascular: Regular rate and rhythm, no murmurs Pulm: Nonlabored breathing on room air, no crackles or wheezing Abd: Soft, NT, ND Ext: No edema of BLE Neuro: Grossly intact   Assessment & Plan:   See Encounters Tab for problem based charting.  Patient discussed with Dr. Heber [place]

## 2020-09-27 NOTE — Assessment & Plan Note (Signed)
Patient reports he has been dealing with anxiety for sometime. When he has an acute stressor in his life he feels more anxious. He gets an uncomfortable feeling in his abdomen and he sometimes has difficulty sleeping. He states he has tried wellbutrin in the past for help with stopping smoking but did not like how the medication made him feel, noting it made him more agitated. He is not currently interested in an SSRI given the side effect profile. Because the anxiety seems to be situational and not all the time recommended we start hydroxyzine and patient was amenable to this.  Plan: Start hydroxyzine PRN, will follow up with patient in 1 month to see how his anxiety his doing.

## 2020-09-27 NOTE — Assessment & Plan Note (Addendum)
Patient was seen by his cardiologist who recommended that he start atorvastatin or Crestor for his lipid-lowering medication.  However patient notes that he was unable to afford the medication and subsequently has not been on any statin for the last several months.  We showed patient the cost of the medication on good Rx as well as on cost plus.  However patient states that he does not want to go through multiple steps to obtain the medication and would prefer to continue the simvastatin.  We reiterated that the higher intensity statin was recommended given his history of coronary artery disease and the risk for another event.  He noted understanding of this. His ASCVD risk is 13.9%. His last lipid panel was 1 year ago. He has labs scheduled through his infectious disease doctor.  Plan: Simvastatin was renewed as patient stated that he would not pay for atorvastatin or Crestor at his current pharmacy and he was not interested in trying to obtain the medications more cheaply via the websites that we provided to him.

## 2020-09-28 ENCOUNTER — Ambulatory Visit: Payer: No Typology Code available for payment source

## 2020-09-28 ENCOUNTER — Other Ambulatory Visit: Payer: Self-pay

## 2020-09-29 NOTE — Progress Notes (Signed)
Internal Medicine Clinic Attending  Case discussed with Dr. Carter  At the time of the visit.  We reviewed the resident's history and exam and pertinent patient test results.  I agree with the assessment, diagnosis, and plan of care documented in the resident's note.  

## 2020-10-05 ENCOUNTER — Other Ambulatory Visit: Payer: Self-pay

## 2020-10-05 ENCOUNTER — Ambulatory Visit: Payer: No Typology Code available for payment source

## 2020-10-17 ENCOUNTER — Encounter: Payer: Self-pay | Admitting: Infectious Diseases

## 2020-10-19 ENCOUNTER — Ambulatory Visit: Payer: No Typology Code available for payment source

## 2020-10-26 ENCOUNTER — Other Ambulatory Visit: Payer: Self-pay | Admitting: Student

## 2020-10-26 ENCOUNTER — Ambulatory Visit: Payer: No Typology Code available for payment source

## 2020-10-26 DIAGNOSIS — I10 Essential (primary) hypertension: Secondary | ICD-10-CM

## 2020-10-28 MED ORDER — LISINOPRIL 20 MG PO TABS: 20.0000 mg | ORAL_TABLET | Freq: Every day | ORAL | 3 refills | Status: DC

## 2020-11-02 ENCOUNTER — Ambulatory Visit: Payer: No Typology Code available for payment source

## 2020-11-02 ENCOUNTER — Other Ambulatory Visit: Payer: Self-pay

## 2020-11-09 ENCOUNTER — Ambulatory Visit: Payer: No Typology Code available for payment source

## 2020-11-16 ENCOUNTER — Ambulatory Visit: Payer: No Typology Code available for payment source

## 2020-11-23 ENCOUNTER — Ambulatory Visit: Payer: No Typology Code available for payment source

## 2020-12-14 ENCOUNTER — Other Ambulatory Visit: Payer: Self-pay

## 2020-12-14 ENCOUNTER — Ambulatory Visit (HOSPITAL_COMMUNITY)
Admission: EM | Admit: 2020-12-14 | Discharge: 2020-12-14 | Disposition: A | Discharge status: Home / Self Care | Payer: No Typology Code available for payment source | Attending: Urgent Care | Admitting: Urgent Care

## 2020-12-14 ENCOUNTER — Encounter (HOSPITAL_COMMUNITY): Payer: Self-pay

## 2020-12-14 DIAGNOSIS — R52 Pain, unspecified: Secondary | ICD-10-CM

## 2020-12-14 DIAGNOSIS — R051 Acute cough: Secondary | ICD-10-CM | POA: Diagnosis not present

## 2020-12-14 DIAGNOSIS — Z20822 Contact with and (suspected) exposure to covid-19: Secondary | ICD-10-CM | POA: Insufficient documentation

## 2020-12-14 DIAGNOSIS — Z87891 Personal history of nicotine dependence: Secondary | ICD-10-CM | POA: Insufficient documentation

## 2020-12-14 DIAGNOSIS — B349 Viral infection, unspecified: Secondary | ICD-10-CM | POA: Diagnosis not present

## 2020-12-14 DIAGNOSIS — R519 Headache, unspecified: Secondary | ICD-10-CM

## 2020-12-14 DIAGNOSIS — J111 Influenza due to unidentified influenza virus with other respiratory manifestations: Secondary | ICD-10-CM | POA: Diagnosis not present

## 2020-12-14 LAB — SARS CORONAVIRUS 2 (TAT 6-24 HRS): SARS Coronavirus 2: NEGATIVE

## 2020-12-14 MED ORDER — PSEUDOEPHEDRINE HCL 30 MG PO TABS: 30.0000 mg | ORAL_TABLET | Freq: Three times a day (TID) | ORAL | 0 refills | Status: DC | PRN

## 2020-12-14 MED ORDER — CETIRIZINE HCL 10 MG PO TABS: 10.0000 mg | ORAL_TABLET | Freq: Every day | ORAL | 0 refills | Status: DC

## 2020-12-14 MED ORDER — PROMETHAZINE-DM 6.25-15 MG/5ML PO SYRP: 5.0000 mL | ORAL_SOLUTION | Freq: Every evening | ORAL | 0 refills | Status: DC | PRN

## 2020-12-14 MED ORDER — OSELTAMIVIR PHOSPHATE 75 MG PO CAPS: 75.0000 mg | ORAL_CAPSULE | Freq: Two times a day (BID) | ORAL | 0 refills | Status: DC

## 2020-12-14 NOTE — ED Provider Notes (Signed)
[place]   MRN: [id number] DOB: 03/03/1955  Subjective:   Albert Brown is a 65 y.o. male with PMH of allergic rhinitis, HIV, CAD s/p PCI, NSTEMI presenting for 2-day history of acute onset chills, fatigue, body aches, sinus headaches, coughing.  No chest pain, shortness of breath, wheezing.  CD4 counts from 07/31/2020 were normal.  No history of CKD.  Patient is not a smoker.  No current facility-administered medications for this encounter.  Current Outpatient Medications:    aspirin EC 81 MG tablet, Take 1 tablet (81 mg total) by mouth daily., Disp: 90 tablet, Rfl: 3   cetirizine (ZYRTEC) 10 MG tablet, Take 10 mg by mouth daily., Disp: , Rfl:    Coenzyme Q10 (COQ-10) 50 MG CAPS, Take 1 tablet by mouth daily., Disp: , Rfl:    emtricitabine-rilpivir-tenofovir AF (ODEFSEY) 200-25-25 MG TABS tablet, Take 1 tablet by mouth daily with breakfast., Disp: 90 tablet, Rfl: 3   hydrOXYzine (ATARAX/VISTARIL) 25 MG tablet, TAKE 1 TABLET(25 MG) BY MOUTH THREE TIMES DAILY AS NEEDED, Disp: 90 tablet, Rfl: 1   lisinopril (ZESTRIL) 20 MG tablet, Take 1 tablet (20 mg total) by mouth daily., Disp: 90 tablet, Rfl: 3   Multiple Vitamin (MULTI VITAMIN MENS PO), Take 1 tablet by mouth daily., Disp: , Rfl:    nitroGLYCERIN (NITROSTAT) 0.4 MG SL tablet, Place 1 tablet (0.4 mg total) under the tongue every 5 (five) minutes as needed for chest pain., Disp: 25 tablet, Rfl: 3   pantoprazole (PROTONIX) 40 MG tablet, Take 1 tablet (40 mg total) by mouth daily., Disp: 30 tablet, Rfl: 5   promethazine (PHENERGAN) 25 MG tablet, Take 1 tablet (25 mg total) by mouth every 8 (eight) hours as needed for nausea or vomiting., Disp: 20 tablet, Rfl: 0   simvastatin (ZOCOR) 20 MG tablet, Take 1 tablet (20 mg total) by mouth every evening., Disp: 30 tablet, Rfl: 11   Allergies  Allergen Reactions   Brilinta [Ticagrelor] Shortness Of Breath    BREATHING ISSUES   Codeine Anaphylaxis   Bactrim  [Sulfamethoxazole-Trimethoprim]     "Pt stated he died when taking this"   Ciprofloxacin     REACTION: rash   Dapsone     REACTION: severe-anaphylaxis   Pravastatin Other (See Comments)    Muscle cramps   Sulfamethoxazole-Trimethoprim     Anaphylaxis     Past Medical History:  Diagnosis Date   ALLERGIC RHINITIS    Allergic sinusitis    Anxiety state, unspecified    CAD S/P percutaneous coronary angioplasty 08/08/2013   mRCA  Xience Alpine DES 2.75 mm x 33 mm (3.0 mm)   Cerumen impaction, right    DEPRESSION    Diarrhea    Erectile dysfunction    EXTERNAL HEMORRHOIDS WITHOUT MENTION COMP    GERD    Hiatal hernia    HIV DISEASE    HIV infection ([place])    HYPOGONADISM    Internal hemorrhoids    Non-ST elevated myocardial infarction (non-STEMI) ([place]) 08/08/2013   SubAcute100% RCA -- PCI; Echo: EF 55-605, Normal LV Size & Function - ~mild HK of Inferior Myocardium with mildly reduced RV function.  Normal Valve function   Periodontal disease    PERIPHERAL NEUROPATHY    PHARYNGITIS, RECURRENT    Pterygium of left eye    Tubular adenoma of colon    Urinary frequency    Urticaria    VERTIGO      Past Surgical History:  Procedure Laterality  Date   LEFT HEART CATHETERIZATION WITH CORONARY ANGIOGRAM N/A 08/08/2013   Procedure: LEFT HEART CATHETERIZATION WITH CORONARY ANGIOGRAM;  Surgeon: [name], MD;  Location: [facility] CATH LAB;  Service: Cardiovascular;  Laterality: N/A;   PERCUTANEOUS CORONARY STENT INTERVENTION (PCI-S)  08/08/2013   mRCA 100% --> Xience Alpine DES 2.75 mm x 33 mm --> 3.0 mm    Family History  Problem Relation Age of Onset   Cancer Mother        brain tumor   Prostate cancer Father    Osteosarcoma Father    Alcohol abuse Father    Prostate cancer Brother    Diabetes Brother    Colon cancer Neg Hx    Esophageal cancer Neg Hx    Liver cancer Neg Hx    Rectal cancer Neg Hx    Stomach cancer Neg Hx     Social History   Tobacco Use   Smoking  status: Former    Types: Cigarettes    Quit date: 03/02/2007    Years since quitting: 13.7   Smokeless tobacco: Never   Tobacco comments:    pt. no longer smokes  Vaping Use   Vaping Use: Never used  Substance Use Topics   Alcohol use: Yes    Alcohol/week: 8.0 standard drinks    Types: 8 Cans of beer per week    Comment: sometimes.   Drug use: Not Currently    Types: Marijuana    Comment: daily     ROS   Objective:   Vitals: BP (!) 153/86 (BP Location: Right Arm)   Pulse 83   Temp 98.4 F (36.9 C) (Oral)   Resp 17   SpO2 95%   Physical Exam Constitutional:      General: He is not in acute distress.    Appearance: Normal appearance. He is well-developed and normal weight. He is not ill-appearing, toxic-appearing or diaphoretic.  HENT:     Head: Normocephalic and atraumatic.     Right Ear: Tympanic membrane, ear canal and external ear normal. There is no impacted cerumen.     Left Ear: Tympanic membrane, ear canal and external ear normal. There is no impacted cerumen.     Nose: Nose normal. No congestion or rhinorrhea.     Mouth/Throat:     Mouth: Mucous membranes are moist.     Pharynx: Oropharynx is clear. No oropharyngeal exudate or posterior oropharyngeal erythema.  Eyes:     General: No scleral icterus.       Right eye: No discharge.        Left eye: No discharge.     Extraocular Movements: Extraocular movements intact.     Conjunctiva/sclera: Conjunctivae normal.     Pupils: Pupils are equal, round, and reactive to light.  Neck:     Meningeal: Brudzinski's sign and Kernig's sign absent.  Cardiovascular:     Rate and Rhythm: Normal rate and regular rhythm.     Heart sounds: Normal heart sounds. No murmur heard.   No friction rub. No gallop.  Pulmonary:     Effort: Pulmonary effort is normal. No respiratory distress.     Breath sounds: Normal breath sounds. No stridor. No wheezing, rhonchi or rales.  Musculoskeletal:     Cervical back: Normal range of  motion and neck supple. No rigidity. No muscular tenderness.  Neurological:     General: No focal deficit present.     Mental Status: He is alert and oriented to person, place, and time.  Cranial Nerves: No cranial nerve deficit.     Motor: No weakness.     Coordination: Coordination normal.     Gait: Gait normal.     Deep Tendon Reflexes: Reflexes normal.  Psychiatric:        Mood and Affect: Mood normal.        Behavior: Behavior normal.        Thought Content: Thought content normal.        Judgment: Judgment normal.     Assessment and Plan :   PDMP not reviewed this encounter.  1. Viral syndrome   2. Influenza-like illness   3. Body aches   4. Sinus headache   5. Acute cough    Will manage patient for influenza given his symptoms.  Start Tamiflu.  Respiratory panel is pending. Deferred imaging given clear cardiopulmonary exam, hemodynamically stable vital signs.  Maintain all other regular medications. Counseled patient on potential for adverse effects with medications prescribed/recommended today, ER and return-to-clinic precautions discussed, patient verbalized understanding.    Jaynee Eagles, [place] 12/14/20 [phone]

## 2020-12-14 NOTE — ED Triage Notes (Signed)
Pt presents with congestion, headache, nausea, fatigue, and body aches X 2 days.

## 2021-03-01 ENCOUNTER — Encounter (HOSPITAL_COMMUNITY): Payer: Self-pay | Admitting: Emergency Medicine

## 2021-03-01 ENCOUNTER — Other Ambulatory Visit: Payer: Self-pay

## 2021-03-01 ENCOUNTER — Ambulatory Visit (HOSPITAL_COMMUNITY)
Admission: EM | Admit: 2021-03-01 | Discharge: 2021-03-01 | Disposition: A | Discharge status: Home / Self Care | Payer: 59

## 2021-03-01 DIAGNOSIS — B349 Viral infection, unspecified: Secondary | ICD-10-CM

## 2021-03-01 LAB — POC INFLUENZA A AND B ANTIGEN (URGENT CARE ONLY)
INFLUENZA A ANTIGEN, POC: NEGATIVE
INFLUENZA B ANTIGEN, POC: NEGATIVE

## 2021-03-01 MED ORDER — IBUPROFEN 800 MG PO TABS: 800.0000 mg | ORAL_TABLET | Freq: Three times a day (TID) | ORAL | 0 refills | Status: DC

## 2021-03-01 MED ORDER — BENZONATATE 100 MG PO CAPS: 100.0000 mg | ORAL_CAPSULE | Freq: Three times a day (TID) | ORAL | 0 refills | Status: DC

## 2021-03-01 MED ORDER — KETOROLAC TROMETHAMINE 30 MG/ML IJ SOLN
30.0000 mg | Freq: Once | INTRAMUSCULAR | Status: AC
  Administered 2021-03-01: 30 mg via INTRAMUSCULAR

## 2021-03-01 MED ORDER — KETOROLAC TROMETHAMINE 30 MG/ML IJ SOLN
INTRAMUSCULAR | Status: AC
  Filled 2021-03-01: qty 1

## 2021-03-01 MED ORDER — NYSTATIN 100000 UNIT/ML MT SUSP: 500000.0000 [IU] | Freq: Four times a day (QID) | OROMUCOSAL | 0 refills | Status: DC

## 2021-03-01 NOTE — Discharge Instructions (Signed)
Your symptoms today are most likely being caused by a virus and should steadily improve in time it can take up to 7 to 10 days before you truly start to see a turnaround however things will get better  Flu test is pending you will be called if positive  For your tongue, gargle and spit solution, 4 times a day for 7 days     You can take Tylenol and/or Ibuprofen as needed for fever reduction and pain relief.   For cough: honey 1/2 to 1 teaspoon (you can dilute the honey in water or another fluid).  You can also use guaifenesin and dextromethorphan for cough. You can use a humidifier for chest congestion and cough.  If you don't have a humidifier, you can sit in the bathroom with the hot shower running.      For sore throat: try warm salt water gargles, cepacol lozenges, throat spray, warm tea or water with lemon/honey, popsicles or ice, or OTC cold relief medicine for throat discomfort.   For congestion: take a daily anti-histamine like Zyrtec, Claritin, and a oral decongestant, such as pseudoephedrine.  You can also use Flonase 1-2 sprays in each nostril daily.   It is important to stay hydrated: drink plenty of fluids (water, gatorade/powerade/pedialyte, juices, or teas) to keep your throat moisturized and help further relieve irritation/discomfort.

## 2021-03-01 NOTE — ED Triage Notes (Signed)
Headache started Monday with fever, chills.  Now has no appetite.  Patient has nausea, no vomiting.  Patient has a runny nose and cough.  Patient reports slight improvement today

## 2021-03-01 NOTE — ED Triage Notes (Signed)
Has only taken HIV meds, no other regular medicines taken while feeling sick

## 2021-03-01 NOTE — ED Provider Notes (Signed)
[place]    CSN: [id number] Arrival date & time: 03/01/21  0815      History   Chief Complaint Chief Complaint  Patient presents with   Fever    HPI JEWETT MCGANN is a 66 y.o. male.   Patient presents with fever, chills, nasal congestion, rhinorrhea, sore throat, cough, intermittent generalized headaches, nausea without vomiting for 4 days.  Headache making it difficult for him to sleep.  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  Home COVID test negative.  Tolerating food and liquids.  Has attempted use of Alka-Seltzer, aspirin and Promethazine DM with minimal relief.  History of CAD, GERD, HIV, MI, vertigo.   Past Medical History:  Diagnosis Date   ALLERGIC RHINITIS    Allergic sinusitis    Anxiety state, unspecified    CAD S/P percutaneous coronary angioplasty 08/08/2013   mRCA  Xience Alpine DES 2.75 mm x 33 mm (3.0 mm)   Cerumen impaction, right    DEPRESSION    Diarrhea    Erectile dysfunction    EXTERNAL HEMORRHOIDS WITHOUT MENTION COMP    GERD    Hiatal hernia    HIV DISEASE    HIV infection ([place])    HYPOGONADISM    Internal hemorrhoids    Non-ST elevated myocardial infarction (non-STEMI) ([place]) 08/08/2013   SubAcute100% RCA -- PCI; Echo: EF 55-605, Normal LV Size & Function - ~mild HK of Inferior Myocardium with mildly reduced RV function.  Normal Valve function   Periodontal disease    PERIPHERAL NEUROPATHY    PHARYNGITIS, RECURRENT    Pterygium of left eye    Tubular adenoma of colon    Urinary frequency    Urticaria    VERTIGO     Patient Active Problem List   Diagnosis Date Noted   Diverticulitis 09/07/2020   Fatigue 07/23/2019   Cat scratch 06/16/2019   Tinnitus 04/20/2019   Encounter for screening colonoscopy 08/24/2018   Chronic urticaria 08/11/2018   Dyspnea on exertion 08/01/2017   Coronary artery disease involving native coronary artery of native heart with angina pectoris ([place]) 08/01/2017   Insomnia  04/30/2017   Essential hypertension 09/27/2016   Hepatitis B immune 07/31/2016   Hyperlipidemia LDL goal <70 12/05/2013   Dyslipidemia 08/10/2013   NSTEMI (non-ST elevated myocardial infarction) ([place]) 08/08/2013    Class: Hospitalized for   CAD S/P percutaneous coronary angioplasty - Xience outline DES to mid RCA (2.75 mm x 33 mm --> 3.0 mm) 08/08/2013    Class: History of   Presence of drug coated stent in right coronary artery 08/08/2013   Erectile dysfunction 01/18/2013   Pterygium eye, left 08/03/2012   Cerumen impaction, right 07/25/2011   Periodontal disease 08/21/2010   Allergic sinusitis 08/21/2010   Hypotestosteronism 09/20/2009   Anxiety state 09/20/2009   URINARY FREQUENCY 03/16/2009   PHARYNGITIS, RECURRENT 10/06/2008   EXTERNAL HEMORRHOIDS WITHOUT MENTION COMP 09/15/2008   DIARRHEA 09/15/2008   Human immunodeficiency virus (HIV) disease ([place]) 08/04/2006   Depression 08/04/2006   PERIPHERAL NEUROPATHY 08/04/2006   ALLERGIC RHINITIS 08/04/2006   GERD 08/04/2006   VERTIGO 08/04/2006    Past Surgical History:  Procedure Laterality Date   LEFT HEART CATHETERIZATION WITH CORONARY ANGIOGRAM N/A 08/08/2013   Procedure: LEFT HEART CATHETERIZATION WITH CORONARY ANGIOGRAM;  Surgeon: [name], MD;  Location: [facility] CATH LAB;  Service: Cardiovascular;  Laterality: N/A;   PERCUTANEOUS CORONARY STENT INTERVENTION (PCI-S)  08/08/2013   mRCA 100% --> Xience Alpine DES  2.75 mm x 33 mm --> 3.0 mm       Home Medications    Prior to Admission medications   Medication Sig Start Date End Date Taking? Authorizing Provider  NON FORMULARY Has had aleve, alka seltzer   Yes [provider]  aspirin EC 81 MG tablet Take 1 tablet (81 mg total) by mouth daily. 01/02/16   Wende Bushy, MD  cetirizine (ZYRTEC ALLERGY) 10 MG tablet Take 1 tablet (10 mg total) by mouth daily. 12/14/20   Jaynee Eagles, PA-C  Coenzyme Q10 (COQ-10) 50 MG CAPS Take 1 tablet by mouth daily.    [provider]  emtricitabine-rilpivir-tenofovir AF (ODEFSEY) 200-25-25 MG TABS tablet Take 1 tablet by mouth daily with breakfast. 09/07/20   Campbell Riches, MD  hydrOXYzine (ATARAX/VISTARIL) 25 MG tablet TAKE 1 TABLET(25 MG) BY MOUTH THREE TIMES DAILY AS NEEDED 10/28/20   Maudie Mercury, MD  lisinopril (ZESTRIL) 20 MG tablet Take 1 tablet (20 mg total) by mouth daily. Patient not taking: Reported on 03/01/2021 10/28/20   Maudie Mercury, MD  Multiple Vitamin (MULTI VITAMIN MENS PO) Take 1 tablet by mouth daily.    [provider]  nitroGLYCERIN (NITROSTAT) 0.4 MG SL tablet Place 1 tablet (0.4 mg total) under the tongue every 5 (five) minutes as needed for chest pain. 01/16/15   [name], MD  oseltamivir (TAMIFLU) 75 MG capsule Take 1 capsule (75 mg total) by mouth 2 (two) times daily. Patient not taking: Reported on 03/01/2021 12/14/20   Jaynee Eagles, PA-C  pantoprazole (PROTONIX) 40 MG tablet Take 1 tablet (40 mg total) by mouth daily. Patient not taking: Reported on 03/01/2021 09/20/19   Levin Erp, PA  promethazine (PHENERGAN) 25 MG tablet Take 1 tablet (25 mg total) by mouth every 8 (eight) hours as needed for nausea or vomiting. 07/23/19 09/20/19  Harvie Heck, MD  promethazine-dextromethorphan (PROMETHAZINE-DM) 6.25-15 MG/5ML syrup Take 5 mLs by mouth at bedtime as needed for cough. 12/14/20   Jaynee Eagles, PA-C  pseudoephedrine (SUDAFED) 30 MG tablet Take 1 tablet (30 mg total) by mouth every 8 (eight) hours as needed for congestion. 12/14/20   Jaynee Eagles, PA-C  simvastatin (ZOCOR) 20 MG tablet Take 1 tablet (20 mg total) by mouth every evening. 09/26/20 09/26/21  Rick Duff, MD    Family History Family History  Problem Relation Age of Onset   Cancer Mother        brain tumor   Prostate cancer Father    Osteosarcoma Father    Alcohol abuse Father    Prostate cancer Brother    Diabetes Brother    Colon cancer Neg Hx    Esophageal cancer Neg Hx    Liver  cancer Neg Hx    Rectal cancer Neg Hx    Stomach cancer Neg Hx     Social History Social History   Tobacco Use   Smoking status: Former    Types: Cigarettes    Quit date: 03/02/2007    Years since quitting: 14.0   Smokeless tobacco: Never   Tobacco comments:    pt. no longer smokes  Vaping Use   Vaping Use: Never used  Substance Use Topics   Alcohol use: Yes    Alcohol/week: 8.0 standard drinks    Types: 8 Cans of beer per week    Comment: sometimes.   Drug use: Not Currently    Types: Marijuana    Comment: daily      Allergies  Brilinta [ticagrelor], Codeine, Bactrim [sulfamethoxazole-trimethoprim], Ciprofloxacin, Dapsone, Pravastatin, and Sulfamethoxazole-trimethoprim   Review of Systems Review of Systems  Constitutional:  Positive for chills and fever. Negative for activity change, appetite change, diaphoresis, fatigue and unexpected weight change.  HENT:  Positive for congestion, rhinorrhea and sore throat. Negative for dental problem, drooling, ear discharge, ear pain, facial swelling, hearing loss, mouth sores, nosebleeds, postnasal drip, sinus pressure, sinus pain, sneezing, tinnitus, trouble swallowing and voice change.   Respiratory:  Positive for cough. Negative for apnea, choking, chest tightness, shortness of breath, wheezing and stridor.   Cardiovascular: Negative.   Gastrointestinal:  Positive for nausea. Negative for abdominal distention, abdominal pain, anal bleeding, blood in stool, constipation, diarrhea, rectal pain and vomiting.  Skin: Negative.   Neurological:  Positive for headaches. Negative for dizziness, tremors, seizures, syncope, facial asymmetry, speech difficulty, weakness, light-headedness and numbness.    Physical Exam Triage Vital Signs ED Triage Vitals  Enc Vitals Group     BP 03/01/21 0852 (!) 143/89     Pulse Rate 03/01/21 0852 92     Resp 03/01/21 0852 20     Temp 03/01/21 0852 99.4 F (37.4 C)     Temp Source 03/01/21 0852 Oral      SpO2 03/01/21 0852 96 %     Weight --      Height --      Head Circumference --      Peak Flow --      Pain Score 03/01/21 0847 10     Pain Loc --      Pain Edu? --      Excl. in [place]? --    No data found.  Updated Vital Signs BP (!) 143/89 (BP Location: Left Arm)    Pulse 92    Temp 99.4 F (37.4 C) (Oral)    Resp 20    SpO2 96%   Visual Acuity Right Eye Distance:   Left Eye Distance:   Bilateral Distance:    Right Eye Near:   Left Eye Near:    Bilateral Near:     Physical Exam Constitutional:      Appearance: Normal appearance. He is normal weight.  HENT:     Head: Normocephalic.     Right Ear: Tympanic membrane, ear canal and external ear normal.     Left Ear: Tympanic membrane, ear canal and external ear normal.     Nose: Congestion present.     Mouth/Throat:     Mouth: Mucous membranes are moist.     Pharynx: Posterior oropharyngeal erythema present.     Tonsils: No tonsillar exudate. 1+ on the right. 1+ on the left.     Comments: Thrush present on tongue Eyes:     Extraocular Movements: Extraocular movements intact.  Cardiovascular:     Rate and Rhythm: Normal rate and regular rhythm.     Pulses: Normal pulses.     Heart sounds: Normal heart sounds.  Pulmonary:     Effort: Pulmonary effort is normal.     Breath sounds: Normal breath sounds.  Musculoskeletal:     Cervical back: Normal range of motion.  Lymphadenopathy:     Cervical: Cervical adenopathy present.  Skin:    General: Skin is warm and dry.  Neurological:     Mental Status: He is alert and oriented to person, place, and time. Mental status is at baseline.  Psychiatric:        Mood and Affect: Mood normal.  Behavior: Behavior normal.     UC Treatments / Results  Labs (all labs ordered are listed, but only abnormal results are displayed) Labs Reviewed - No data to display  EKG   Radiology No results found.  Procedures Procedures (including critical care  time)  Medications Ordered in UC Medications - No data to display  Initial Impression / Assessment and Plan / UC Course  I have reviewed the triage vital signs and the nursing notes.  Pertinent labs & imaging results that were available during my care of the patient were reviewed by me and considered in my medical decision making (see chart for details).   Viral illness   Discussed etiology of symptoms, timeline and possible resolution with patient, flu test pending, prescribed Tessalon and ibuprofen as headache and coughing for patient's most concerning symptoms, recommended continued use of over-the-counter medications for additional treatment, for thrush prescribed nystatin for 7 days and good dental hygiene, work note given, urgent care follow-up as needed Final Clinical Impressions(s) / UC Diagnoses   Final diagnoses:  None   Discharge Instructions   None    ED Prescriptions   None    PDMP not reviewed this encounter.   Hans Eden, NP 03/01/21 1023

## 2021-03-01 NOTE — ED Notes (Signed)
Flu swab in lab 

## 2021-03-30 ENCOUNTER — Other Ambulatory Visit: Payer: Self-pay

## 2021-03-30 ENCOUNTER — Ambulatory Visit (INDEPENDENT_AMBULATORY_CARE_PROVIDER_SITE_OTHER): Payer: 59 | Admitting: Internal Medicine

## 2021-03-30 ENCOUNTER — Encounter: Payer: Self-pay | Admitting: Internal Medicine

## 2021-03-30 VITALS — BP 152/92 | HR 72 | Temp 97.8°F | Ht 68.0 in | Wt 182.5 lb

## 2021-03-30 DIAGNOSIS — M545 Low back pain, unspecified: Secondary | ICD-10-CM | POA: Insufficient documentation

## 2021-03-30 DIAGNOSIS — R39198 Other difficulties with micturition: Secondary | ICD-10-CM | POA: Diagnosis not present

## 2021-03-30 DIAGNOSIS — F32A Depression, unspecified: Secondary | ICD-10-CM

## 2021-03-30 LAB — POCT URINALYSIS DIPSTICK
Bilirubin, UA: NEGATIVE
Blood, UA: NEGATIVE
Glucose, UA: POSITIVE — AB
Ketones, UA: NEGATIVE
Leukocytes, UA: NEGATIVE
Nitrite, UA: NEGATIVE
Protein, UA: NEGATIVE
Spec Grav, UA: 1.025 (ref 1.010–1.025)
Urobilinogen, UA: 0.2 E.U./dL
pH, UA: 5.5 (ref 5.0–8.0)

## 2021-03-30 NOTE — Progress Notes (Signed)
Internal Medicine Clinic Attending  Case discussed with Dr. Lorin Glass  At the time of the visit.  We reviewed the residents history and exam and pertinent patient test results.  I agree with the assessment, diagnosis, and plan of care documented in the residents note.  Even if PSA is not elevated, he will need further exploration and management of his voiding symptoms as BPH is on the differential.

## 2021-03-30 NOTE — Progress Notes (Signed)
° °  CC: Back pain and difficulty urinating  HPI:  [name] is a 66 y.o. with past medical history as noted below who presents to the clinic today for back pain and difficulty urinating. Please see problem-based list for further details, assessments, and plans.  Past Medical History:  Diagnosis Date   ALLERGIC RHINITIS    Allergic sinusitis    Anxiety state, unspecified    CAD S/P percutaneous coronary angioplasty 08/08/2013   mRCA  Xience Alpine DES 2.75 mm x 33 mm (3.0 mm)   Cerumen impaction, right    DEPRESSION    Diarrhea    Erectile dysfunction    EXTERNAL HEMORRHOIDS WITHOUT MENTION COMP    GERD    Hiatal hernia    HIV DISEASE    HIV infection ([place])    HYPOGONADISM    Internal hemorrhoids    Non-ST elevated myocardial infarction (non-STEMI) ([place]) 08/08/2013   SubAcute100% RCA -- PCI; Echo: EF 55-605, Normal LV Size & Function - ~mild HK of Inferior Myocardium with mildly reduced RV function.  Normal Valve function   Periodontal disease    PERIPHERAL NEUROPATHY    PHARYNGITIS, RECURRENT    Pterygium of left eye    Tubular adenoma of colon    Urinary frequency    Urticaria    VERTIGO    Review of Systems: Negative aside from that listed in individualized problem based charting.  Physical Exam:  Vitals:   03/30/21 1019  BP: (!) 152/92  Pulse: 72  Temp: 97.8 F (36.6 C)  TempSrc: Oral  SpO2: 99%  Weight: 182 lb 8 oz (82.8 kg)  Height: 5\' 8"  (1.727 m)   General: NAD, nl appearance HE: Normocephalic, atraumatic, EOMI, Conjunctivae normal ENT: No congestion, no rhinorrhea, no exudate or erythema  Cardiovascular: Normal rate, regular rhythm. No murmurs, rubs, or gallops Pulmonary: Effort normal, breath sounds normal. No wheezes, rales, or rhonchi Abdominal: soft, bowel sounds present, mild TTP to RLQ Musculoskeletal: no swelling, deformity, or injury.  Tenderness palpation the right lower back musculature. No spinal tenderness. Skin: Warm, dry, no  bruising, erythema, or rash Psychiatric/Behavioral: normal mood, normal behavior   Neuro: Alert and oriented x3; no focal deficit appreciated.  Sensation intact. DRE, performed with presence of chaperone: Prostate is smooth to touch, mildly enlarged.  No nodules or irregularities noted otherwise.  Denies pain during exam.  Assessment & Plan:   See Encounters Tab for problem based charting.  Patient discussed with Dr. Jimmye Norman

## 2021-03-30 NOTE — Assessment & Plan Note (Signed)
Score of 11 on PHQ-9 today.  Discussed this further with the patient, who confirmed that he has been depressed lately.  Discussed that he does not typically like to rely on medications, however he would be open to counseling for his depression.  Plan: Referral to Dr. Carolynne Edouard today.  Can consider addition of antidepressant if necessary.  Follow-up in 1 month for reevaluation of his depression.

## 2021-03-30 NOTE — Assessment & Plan Note (Addendum)
The patient is primarily here due to difficulty urinating for the past year or 2.  He says that it "takes forever to pee when I go to the bathroom."  He says that he is able to go, however it is not a steady stream and needs to force out the urine when he uses the bathroom.  Denies dysuria.  Denies incontinence.  He states that his father had prostate cancer in his 40s, and his older brother had it in his 12s.  He is asking about prostate screening given his family history and urinary symptoms.  DRE performed: Performed in the presence of a chaperone.  Prostate is smooth to touch, mildly enlarged.  No nodules or irregularities noted otherwise.  Denies pain during exam.  UA obtained today was within normal limits.  Plan: We will obtain PSA today.  If this is high, can consider urology referral due to presence of urinary symptoms.  Addendum: PSA elevated at 6.9.  Contact patient with these results.  Discussed recommendation to refer to urology given elevated PSA as well as strong family history of prostate cancer.  Patient is amenable to this plan.

## 2021-03-30 NOTE — Patient Instructions (Signed)
Thank you, [name] for allowing [place] to provide your care today. Today we discussed your prostate and your back pain.  Back pain: Hold off on taking any NSAIDs like ibuprofen until you are able to locate the records from your gastroenterologist about your possible gastritis. Continue using heat and ice for your back for now since these have been helpful, and let us know if your pain does not improve.  Prostate: I did not feel any obvious irregularities on your exam, although your prostate could be slightly enlarged. We will get a PSA today and contact you with these results.    Depression: Your screening for depression was positive. We talked about various options including medication and counseling. For now, since you prefer not to start with medication, I will refer you to Dr. Theodis Shove for counseling.  I have ordered the following labs for you:  Lab Orders         PSA      Referrals ordered today:   Referral Orders         Ambulatory referral to [name]       I have ordered the following medication/changed the following medications:   Stop the following medications: There are no discontinued medications.   Start the following medications: No orders of the defined types were placed in this encounter.    Follow up:  1 month     Should you have any questions or concerns please call the internal medicine clinic at [phone].

## 2021-03-30 NOTE — Assessment & Plan Note (Signed)
The patient endorses an acute episode of right lower back pain.  He states this happened today.  He is unsure how this happened and states that he was just laying in bed as he normally does.  Denies numbness/tingling.  He has tried 1 Tylenol this morning, however he states this did not help much.  He has had this same back pain for a while, and it occurs intermittently.  Typically, uses heating pads for the pain because he likes to avoid medications.  Heating pads worked well to alleviate the pain.  Plan: Continue conservative management with Tylenol and as needed, holding off on NSAIDs given patient endorsed history of gastritis.  Patient said he will find his records discussing this diagnosis.  Otherwise, heating pads and ice recommended for the pain.  Patient will let us know if the pain worsens or does not respond to therapy.

## 2021-03-31 LAB — PSA: Prostate Specific Ag, Serum: 6.9 ng/mL — ABNORMAL HIGH (ref 0.0–4.0)

## 2021-04-03 NOTE — Addendum Note (Signed)
Addended by: Orvis Brill on: 04/03/2021 04:40 PM   Modules accepted: Orders

## 2021-04-04 ENCOUNTER — Ambulatory Visit (INDEPENDENT_AMBULATORY_CARE_PROVIDER_SITE_OTHER): Payer: 59 | Admitting: Internal Medicine

## 2021-04-04 VITALS — BP 147/83 | HR 82 | Temp 98.1°F | Ht 68.0 in | Wt 178.8 lb

## 2021-04-04 DIAGNOSIS — R5383 Other fatigue: Secondary | ICD-10-CM | POA: Diagnosis not present

## 2021-04-04 DIAGNOSIS — R1031 Right lower quadrant pain: Secondary | ICD-10-CM | POA: Diagnosis not present

## 2021-04-04 LAB — POCT URINALYSIS DIPSTICK
Bilirubin, UA: NEGATIVE
Blood, UA: NEGATIVE
Glucose, UA: NEGATIVE
Ketones, UA: NEGATIVE
Leukocytes, UA: NEGATIVE
Nitrite, UA: NEGATIVE
Protein, UA: NEGATIVE
Spec Grav, UA: >=1.03 — AB (ref 1.010–1.025)
Urobilinogen, UA: 0.2 E.U./dL
pH, UA: 5.5 (ref 5.0–8.0)

## 2021-04-04 MED ORDER — PROMETHAZINE HCL 25 MG PO TABS: 25.0000 mg | ORAL_TABLET | Freq: Three times a day (TID) | ORAL | 0 refills | Status: DC | PRN

## 2021-04-04 NOTE — Progress Notes (Signed)
° °  CC: abdominal pain, new  HPI:  Mr.Philippe H Schindler is a 66 y.o. with past medical history as noted below who presents to the clinic today for new onset abdominal pain. Please see problem-based list for further details, assessments, and plans.  Past Medical History:  Diagnosis Date   ALLERGIC RHINITIS    Allergic sinusitis    Anxiety state, unspecified    CAD S/P percutaneous coronary angioplasty 08/08/2013   mRCA  Xience Alpine DES 2.75 mm x 33 mm (3.0 mm)   Cerumen impaction, right    DEPRESSION    Diarrhea    Erectile dysfunction    EXTERNAL HEMORRHOIDS WITHOUT MENTION COMP    GERD    Hiatal hernia    HIV DISEASE    HIV infection ([place])    HYPOGONADISM    Internal hemorrhoids    Non-ST elevated myocardial infarction (non-STEMI) ([place]) 08/08/2013   SubAcute100% RCA -- PCI; Echo: EF 55-605, Normal LV Size & Function - ~mild HK of Inferior Myocardium with mildly reduced RV function.  Normal Valve function   Periodontal disease    PERIPHERAL NEUROPATHY    PHARYNGITIS, RECURRENT    Pterygium of left eye    Tubular adenoma of colon    Urinary frequency    Urticaria    VERTIGO    Review of Systems: Negative aside from that listed in individualized problem based charting.  Physical Exam:  Vitals:   04/04/21 1409  BP: (!) 147/83  Pulse: 82  Temp: 98.1 F (36.7 C)  TempSrc: Oral  SpO2: 99%  Weight: 178 lb 12.8 oz (81.1 kg)  Height: 5\' 8"  (1.727 m)   General: NAD, nl appearance HE: Normocephalic, atraumatic, EOMI, Conjunctivae normal ENT: No congestion, no rhinorrhea, no exudate or erythema  Cardiovascular: Normal rate, regular rhythm. No murmurs, rubs, or gallops Pulmonary: Effort normal, breath sounds normal. No wheezes, rales, or rhonchi Abdominal: soft, tenderness to deep palpation of RLQ with rebound and guarding.  Bowel sounds present Musculoskeletal: no swelling, deformity, injury or tenderness in extremities Skin: Warm, dry, no bruising, erythema, or  rash Psychiatric/Behavioral: normal mood, normal behavior    Assessment & Plan:   See Encounters Tab for problem based charting.  Patient discussed with Dr.  Saverio Danker

## 2021-04-04 NOTE — Progress Notes (Addendum)
Internal Medicine Clinic Attending  I saw and evaluated the patient.  I personally confirmed the key portions of the history and exam documented by Dr. Lorin Glass and I reviewed pertinent patient test results.  The assessment, diagnosis, and plan were formulated together and I agree with the documentation in the residents note. Patient reporting severe abdominal pain over the last several days that has now improved when seen in clinic today although still present. Exam findings concerning for TTP of RLQ with rebound and guarding. Continues to have nausea, although reports this is a chronic issue. Patient appears non-toxic and is hemodynamically stable. Given clinical stability we have ordered urgent outpatient CT A/P to evaluate for nephrolithiasis, diverticulitis, etc but do not feel patient warrants admission at this time. Labs pending. Discussed return/ED precautions.   Addendum: There was difficulty getting CT approved through insurance. Given delay, resident and I attempted to contact patient several times to f/u on symptoms without response. I was able to reach Albert Brown by telephone Tuesday, 2/14. He reports his abdominal pain has resolved at this time. Given resolution, we will cancel CT. However, advised Albert Brown to call or present to ED if pain or other symptoms return.

## 2021-04-04 NOTE — Patient Instructions (Addendum)
Thank you, [name] for allowing [place] to provide your care today. Today we discussed your abdominal pain and fatigue.  Today, I would like to get some labs to help [place] determine the cause of your belly pain and fatigue. I will contact you with these results. I have ordered imaging for your belly today too. I have messaged the front desk to try to get this scheduled for you tomorrow afternoon. I have placed a new referral for you to see the stomach doctors as well.  I have given you a prescription for some promethazine as well.   I have ordered the following labs for you:  Lab Orders         CBC with Diff         TSH         Iron, TIBC and Ferritin Panel         CMP14 + Anion Gap         Tissue Transglutaminase Abs,IgG,IgA         POCT Urinalysis Dipstick (95638)      Tests ordered today:  CT abdomen/pelvis without contrast  Referrals ordered today:   Referral Orders         Ambulatory referral to Gastroenterology       I have ordered the following medication/changed the following medications:   Start the following medications: Meds ordered this encounter  Medications   promethazine (PHENERGAN) 25 MG tablet    Sig: Take 1 tablet (25 mg total) by mouth every 8 (eight) hours as needed for nausea or vomiting.    Dispense:  20 tablet    Refill:  0     Follow up:  pending CT results     Should you have any questions or concerns please call the internal medicine clinic at [phone].

## 2021-04-04 NOTE — Addendum Note (Signed)
Addended by: Charise Killian on: 04/04/2021 04:43 PM   Modules accepted: Level of Service

## 2021-04-04 NOTE — Assessment & Plan Note (Addendum)
The patient presents here with CC of fatigue and abd pain. In terms of his fatigue, he states that this has been going on for about a year.  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  Also endorses possible constipation.  Prior work-up in 2021 was remarkable for subclinical hyperthyroidism.  He also has a history of depression/anxiety which he is currently treated with hydroxyzine as needed, but otherwise with my visit on them on Friday he declined other medications and counseling.  Plan: We will obtain CBC, iron panel, BMP, TSH for further work-up of his fatigue.  We will contact patient with these results.  I also suspect there is a component of depression contributing to his fatigue as well.

## 2021-04-04 NOTE — Assessment & Plan Note (Addendum)
The patient endorses a 5-day history of right lower quadrant abdominal pain.  He states that the pain started in his right lower back and radiated to his right lower quadrant.  When I saw him on Friday, he had taken ibuprofen and said that he did not have abdominal pain then.  The pain comes on intermittently.  For example, he did not have any pain over the weekend.  On Monday, however, he woke up "drenched in sweat" and also had nausea and a few episodes of NBNB vomiting.  Denies melena.  The pain is not associated with eating.  He describes the pain as stabbing and dull.  The pain was so severe at one point that he was "holding onto his side."  He has possible constipation, stating that he has had smaller formed stools lately and overall has difficulty forming a bowel movement.  He has a history of diverticulosis which was noted on prior colonoscopy in 2021.  Exam is remarkable for mild TTP in the epigastric area, tenderness with deep palpation to the RLQ.  There is rebound and guarding present.  No tenderness to palpation in the lower back area.   Plan: Given presence of rebound or guarding on exam to the right lower quadrant area, will obtain CT abdomen/pelvis with without contrast.  We will schedule this as soon as possible, ideally in the next day or two.  We will also obtain CBC and celiac labs given that these were considered by GI two years ago but were never obtained.  I have placed a new referral to GI as well for further work-up and evaluation.  We will also obtain repeat UA given that this could possibly represent nephrolithiasis.  Differential includes nephrolithiasis versus constipation versus diverticulitis versus less likely celiac disease, less likely cancer.  Prescribed promethazine for nausea.   Addendum: Attempted to contact patient in regards to status on obtaining CT imaging.  We are trying to get this ASAP. No answer, left voicemail.  Will attempt to call again at a later  time.  Addendum: Attempted to contact patient a second time, no answer, left voicemail.

## 2021-04-05 NOTE — Addendum Note (Signed)
Addended by: Orvis Brill on: 04/05/2021 03:22 PM   Modules accepted: Orders

## 2021-04-06 LAB — CBC WITH DIFFERENTIAL/PLATELET
Basophils Absolute: 0 10*3/uL (ref 0.0–0.2)
Basos: 0 %
EOS (ABSOLUTE): 0.2 10*3/uL (ref 0.0–0.4)
Eos: 3 %
Hematocrit: 47.6 % (ref 37.5–51.0)
Hemoglobin: 16.3 g/dL (ref 13.0–17.7)
Immature Grans (Abs): 0 10*3/uL (ref 0.0–0.1)
Immature Granulocytes: 0 %
Lymphocytes Absolute: 1.9 10*3/uL (ref 0.7–3.1)
Lymphs: 36 %
MCH: 31.7 pg (ref 26.6–33.0)
MCHC: 34.2 g/dL (ref 31.5–35.7)
MCV: 92 fL (ref 79–97)
Monocytes Absolute: 0.8 10*3/uL (ref 0.1–0.9)
Monocytes: 15 %
Neutrophils Absolute: 2.3 10*3/uL (ref 1.4–7.0)
Neutrophils: 46 %
Platelets: 165 10*3/uL (ref 150–450)
RBC: 5.15 x10E6/uL (ref 4.14–5.80)
RDW: 12.2 % (ref 11.6–15.4)
WBC: 5.2 10*3/uL (ref 3.4–10.8)

## 2021-04-06 LAB — CMP14 + ANION GAP
ALT: 16 IU/L (ref 0–44)
AST: 19 IU/L (ref 0–40)
Albumin/Globulin Ratio: 2 (ref 1.2–2.2)
Albumin: 4.9 g/dL — ABNORMAL HIGH (ref 3.8–4.8)
Alkaline Phosphatase: 92 IU/L (ref 44–121)
Anion Gap: 13 mmol/L (ref 10.0–18.0)
BUN/Creatinine Ratio: 20 (ref 10–24)
BUN: 24 mg/dL (ref 8–27)
Bilirubin Total: 0.4 mg/dL (ref 0.0–1.2)
CO2: 25 mmol/L (ref 20–29)
Calcium: 9.8 mg/dL (ref 8.6–10.2)
Chloride: 101 mmol/L (ref 96–106)
Creatinine, Ser: 1.2 mg/dL (ref 0.76–1.27)
Globulin, Total: 2.4 g/dL (ref 1.5–4.5)
Glucose: 83 mg/dL (ref 70–99)
Potassium: 5.2 mmol/L (ref 3.5–5.2)
Sodium: 139 mmol/L (ref 134–144)
Total Protein: 7.3 g/dL (ref 6.0–8.5)
eGFR: 68 mL/min/{1.73_m2} (ref >59)

## 2021-04-06 LAB — IRON,TIBC AND FERRITIN PANEL
Ferritin: 155 ng/mL (ref 30–400)
Iron Saturation: 26 % (ref 15–55)
Iron: 84 ug/dL (ref 38–169)
Total Iron Binding Capacity: 319 ug/dL (ref 250–450)
UIBC: 235 ug/dL (ref 111–343)

## 2021-04-06 LAB — TSH: TSH: 3.75 u[IU]/mL (ref 0.450–4.500)

## 2021-04-06 LAB — TISSUE TRANSGLUTAMINASE ABS,IGG,IGA
Tissue Transglut Ab: <2 U/mL (ref 0–5)
Transglutaminase IgA: 5 U/mL — ABNORMAL HIGH (ref 0–3)

## 2021-04-09 NOTE — Addendum Note (Signed)
Addended by: Orvis Brill on: 04/09/2021 05:26 PM   Modules accepted: Orders

## 2021-04-12 ENCOUNTER — Encounter: Payer: Self-pay | Admitting: Internal Medicine

## 2021-04-25 ENCOUNTER — Institutional Professional Consult (permissible substitution): Payer: 59 | Admitting: Behavioral Health

## 2021-06-11 ENCOUNTER — Other Ambulatory Visit: Payer: Self-pay

## 2021-06-11 ENCOUNTER — Other Ambulatory Visit: Payer: 59

## 2021-06-11 ENCOUNTER — Other Ambulatory Visit (HOSPITAL_COMMUNITY)
Admission: RE | Admit: 2021-06-11 | Discharge: 2021-06-11 | Disposition: A | Discharge status: Home / Self Care | Payer: 59 | Source: Ambulatory Visit | Attending: Infectious Diseases | Admitting: Infectious Diseases

## 2021-06-11 DIAGNOSIS — Z113 Encounter for screening for infections with a predominantly sexual mode of transmission: Secondary | ICD-10-CM | POA: Diagnosis present

## 2021-06-11 DIAGNOSIS — B20 Human immunodeficiency virus [HIV] disease: Secondary | ICD-10-CM

## 2021-06-11 DIAGNOSIS — Z79899 Other long term (current) drug therapy: Secondary | ICD-10-CM

## 2021-06-12 LAB — URINE CYTOLOGY ANCILLARY ONLY
Chlamydia: NEGATIVE
Comment: NEGATIVE
Comment: NORMAL
Neisseria Gonorrhea: NEGATIVE

## 2021-06-13 LAB — HELPER T-LYMPH-CD4 (ARMC ONLY)
% CD 4 Pos. Lymph.: 34.4 % (ref 30.8–58.5)
Absolute CD 4 Helper: 447 /uL (ref 359–1519)
Basophils Absolute: 0 10*3/uL (ref 0.0–0.2)
Basos: 0 %
EOS (ABSOLUTE): 0.1 10*3/uL (ref 0.0–0.4)
Eos: 2 %
Hematocrit: 47.9 % (ref 37.5–51.0)
Hemoglobin: 15.6 g/dL (ref 13.0–17.7)
Immature Grans (Abs): 0 10*3/uL (ref 0.0–0.1)
Immature Granulocytes: 0 %
Lymphocytes Absolute: 1.3 10*3/uL (ref 0.7–3.1)
Lymphs: 23 %
MCH: 31.2 pg (ref 26.6–33.0)
MCHC: 32.6 g/dL (ref 31.5–35.7)
MCV: 96 fL (ref 79–97)
Monocytes Absolute: 0.6 10*3/uL (ref 0.1–0.9)
Monocytes: 11 %
Neutrophils Absolute: 3.6 10*3/uL (ref 1.4–7.0)
Neutrophils: 64 %
Platelets: 168 10*3/uL (ref 150–450)
RBC: 5 x10E6/uL (ref 4.14–5.80)
RDW: 12.7 % (ref 11.6–15.4)
WBC: 5.7 10*3/uL (ref 3.4–10.8)

## 2021-06-13 LAB — T-HELPER CELL (CD4) - (RCID CLINIC ONLY)

## 2021-06-16 LAB — HIV-1 RNA QUANT-NO REFLEX-BLD
HIV 1 RNA Quant: NOT DETECTED copies/mL
HIV-1 RNA Quant, Log: NOT DETECTED Log copies/mL

## 2021-06-16 LAB — CBC
HCT: 45.5 % (ref 38.5–50.0)
Hemoglobin: 15.5 g/dL (ref 13.2–17.1)
MCH: 31.6 pg (ref 27.0–33.0)
MCHC: 34.1 g/dL (ref 32.0–36.0)
MCV: 92.7 fL (ref 80.0–100.0)
MPV: 11.2 fL (ref 7.5–12.5)
Platelets: 169 10*3/uL (ref 140–400)
RBC: 4.91 10*6/uL (ref 4.20–5.80)
RDW: 12.8 % (ref 11.0–15.0)
WBC: 6 10*3/uL (ref 3.8–10.8)

## 2021-06-16 LAB — COMPREHENSIVE METABOLIC PANEL
AG Ratio: 1.6 (calc) (ref 1.0–2.5)
ALT: 20 U/L (ref 9–46)
AST: 18 U/L (ref 10–35)
Albumin: 4.2 g/dL (ref 3.6–5.1)
Alkaline phosphatase (APISO): 79 U/L (ref 35–144)
BUN: 17 mg/dL (ref 7–25)
CO2: 26 mmol/L (ref 20–32)
Calcium: 9.3 mg/dL (ref 8.6–10.3)
Chloride: 109 mmol/L (ref 98–110)
Creat: 1.16 mg/dL (ref 0.70–1.35)
Globulin: 2.6 g/dL (calc) (ref 1.9–3.7)
Glucose, Bld: 86 mg/dL (ref 65–99)
Potassium: 4.2 mmol/L (ref 3.5–5.3)
Sodium: 143 mmol/L (ref 135–146)
Total Bilirubin: 0.4 mg/dL (ref 0.2–1.2)
Total Protein: 6.8 g/dL (ref 6.1–8.1)

## 2021-06-16 LAB — LIPID PANEL
Cholesterol: 171 mg/dL (ref <200)
HDL: 58 mg/dL (ref >=40)
LDL Cholesterol (Calc): 90 mg/dL (calc)
Non-HDL Cholesterol (Calc): 113 mg/dL (calc) (ref <130)
Total CHOL/HDL Ratio: 2.9 (calc) (ref <5.0)
Triglycerides: 126 mg/dL (ref <150)

## 2021-06-16 LAB — RPR: RPR Ser Ql: NONREACTIVE

## 2021-06-28 ENCOUNTER — Ambulatory Visit (INDEPENDENT_AMBULATORY_CARE_PROVIDER_SITE_OTHER): Payer: 59 | Admitting: Infectious Diseases

## 2021-06-28 ENCOUNTER — Other Ambulatory Visit: Payer: Self-pay

## 2021-06-28 DIAGNOSIS — I10 Essential (primary) hypertension: Secondary | ICD-10-CM

## 2021-06-28 DIAGNOSIS — B20 Human immunodeficiency virus [HIV] disease: Secondary | ICD-10-CM

## 2021-06-28 DIAGNOSIS — I214 Non-ST elevation (NSTEMI) myocardial infarction: Secondary | ICD-10-CM

## 2021-06-28 NOTE — Assessment & Plan Note (Signed)
He is doing well ?Reviewed his labs.  ?Offered/refused condoms.  ?vax up to date. Got flu last fall, and COVID vax.  ?rtc in 9 months.  ?

## 2021-06-28 NOTE — Progress Notes (Signed)
? ?  Subjective:  ? ? Patient ID: Albert Brown, male  DOB: 12/11/1955, 66 y.o.        MRN: [id number] ? ? ?HPI ?66 yo M with HIV+ (on triumeq --> complera--> odefsy---> dovato) ?He presented to [facility] on 08/07/2013 and was found to have acute total occlusion of the mid RCA. This was successfully treated with PCI plus a drug-eluting stent. He had well-preserved left ventricular systolic function (EF 02-72%). There was mild basilar to mid inferior hypokinesis.  ?Last CV f/u was 06-01-20. He had myoview 07-07-20, "low risk".  ?No recent CP, still SOB occasionally.  ?Has not had CV f/u due to finances.  ?  ?Since last visit- had EGD and colon. Was started on meatmucil after found to have diverticulitis.  ?Since starting on dovato, has been extremely fatigued (with work, ADLs).  ? ?Has gained wt.  ?BP is up today. Attributes to his stress.  ?Today has frustration around his job.  ? ?HIV 1 RNA Quant  ?Date Value  ?06/11/2021 NOT DETECTED copies/mL  ?07/31/2020 Not Detected Copies/mL  ?09/16/2019 <20 NOT DETECTED copies/mL  ? ?CD4 T Cell Abs (/uL)  ?Date Value  ?06/11/2021 SEE SEPARATE REPORT  ?07/31/2020 513  ?09/16/2019 479  ? ? ? ?Health Maintenance  ?Topic Date Due  ?? Hepatitis C Screening  Never done  ?? Zoster Vaccines- Shingrix (1 of 2) Never done  ?? COVID-19 Vaccine (3 - Moderna risk series) 12/25/2019  ?? TETANUS/TDAP  09/09/2021  ?? INFLUENZA VACCINE  09/25/2021  ?? COLONOSCOPY (Pts 45-36yr Insurance coverage will need to be confirmed)  12/17/2026  ?? HIV Screening  Completed  ?? HPV VACCINES  Aged Out  ? ? ? ? ?Review of Systems  ?Constitutional:  Negative for chills, fever and weight loss.  ?Respiratory:  Positive for shortness of breath (not exertional.). Negative for cough.   ?Cardiovascular:  Negative for chest pain.  ?Gastrointestinal:  Negative for constipation and diarrhea.  ?Genitourinary:  Negative for dysuria.  ?Psychiatric/Behavioral:  The patient is nervous/anxious.   ? ?Please see HPI.  All other systems reviewed and negative. ? ?   ?Objective:  ?Physical Exam ?Vitals reviewed.  ?Constitutional:   ?   Appearance: Normal appearance.  ?HENT:  ?   Mouth/Throat:  ?   Mouth: Mucous membranes are moist.  ?   Pharynx: No oropharyngeal exudate.  ?Eyes:  ?   Extraocular Movements: Extraocular movements intact.  ?   Pupils: Pupils are equal, round, and reactive to light.  ?Cardiovascular:  ?   Rate and Rhythm: Normal rate.  ?Pulmonary:  ?   Effort: Pulmonary effort is normal.  ?   Breath sounds: Normal breath sounds.  ?Abdominal:  ?   General: Bowel sounds are normal. There is no distension.  ?   Palpations: Abdomen is soft.  ?   Tenderness: There is no abdominal tenderness.  ?Musculoskeletal:  ?   Cervical back: Normal range of motion. No rigidity.  ?   Right lower leg: No edema.  ?   Left lower leg: No edema.  ?Lymphadenopathy:  ?   Cervical: No cervical adenopathy.  ?Neurological:  ?   General: No focal deficit present.  ?   Mental Status: He is alert.  ? ? ? ? ? ?   ?Assessment & Plan:  ? ?

## 2021-06-28 NOTE — Assessment & Plan Note (Signed)
He has not f/u with CV due to finances ?When he pays off his bills he will f/u ?He is currently asx except occas SOB.  ?

## 2021-06-28 NOTE — Assessment & Plan Note (Signed)
Repeat 168/106 ?Was not able to tolerate ACE-I (fatigue) ?Appreciate Dr Gilford Rile excellent care ?

## 2021-07-19 ENCOUNTER — Encounter: Payer: Self-pay | Admitting: Student in an Organized Health Care Education/Training Program

## 2021-08-06 ENCOUNTER — Encounter: Payer: Self-pay | Admitting: Infectious Diseases

## 2021-09-18 ENCOUNTER — Encounter: Payer: Self-pay | Admitting: Internal Medicine

## 2021-09-28 ENCOUNTER — Other Ambulatory Visit: Payer: Self-pay | Admitting: Infectious Diseases

## 2021-09-28 DIAGNOSIS — B20 Human immunodeficiency virus [HIV] disease: Secondary | ICD-10-CM

## 2021-10-15 ENCOUNTER — Other Ambulatory Visit: Payer: Self-pay | Admitting: Student

## 2022-01-01 ENCOUNTER — Other Ambulatory Visit: Payer: Self-pay

## 2022-01-01 DIAGNOSIS — B20 Human immunodeficiency virus [HIV] disease: Secondary | ICD-10-CM

## 2022-01-01 MED ORDER — ODEFSEY 200-25-25 MG PO TABS: 1.0000 | ORAL_TABLET | Freq: Every day | ORAL | 3 refills | Status: DC

## 2022-02-13 ENCOUNTER — Encounter (HOSPITAL_COMMUNITY): Payer: Self-pay

## 2022-02-13 ENCOUNTER — Ambulatory Visit (INDEPENDENT_AMBULATORY_CARE_PROVIDER_SITE_OTHER): Payer: Commercial Managed Care - HMO

## 2022-02-13 ENCOUNTER — Ambulatory Visit (HOSPITAL_COMMUNITY)
Admission: EM | Admit: 2022-02-13 | Discharge: 2022-02-13 | Disposition: A | Discharge status: Home / Self Care | Payer: Commercial Managed Care - HMO | Attending: Physician Assistant | Admitting: Physician Assistant

## 2022-02-13 DIAGNOSIS — J329 Chronic sinusitis, unspecified: Secondary | ICD-10-CM

## 2022-02-13 DIAGNOSIS — R059 Cough, unspecified: Secondary | ICD-10-CM | POA: Diagnosis not present

## 2022-02-13 DIAGNOSIS — I10 Essential (primary) hypertension: Secondary | ICD-10-CM | POA: Diagnosis not present

## 2022-02-13 DIAGNOSIS — J9801 Acute bronchospasm: Secondary | ICD-10-CM | POA: Diagnosis not present

## 2022-02-13 DIAGNOSIS — J40 Bronchitis, not specified as acute or chronic: Secondary | ICD-10-CM | POA: Diagnosis not present

## 2022-02-13 MED ORDER — FLUTICASONE PROPIONATE 50 MCG/ACT NA SUSP: 1.0000 | Freq: Every day | NASAL | 0 refills | Status: DC

## 2022-02-13 MED ORDER — ALBUTEROL SULFATE HFA 108 (90 BASE) MCG/ACT IN AERS: 2.0000 | INHALATION_SPRAY | Freq: Four times a day (QID) | RESPIRATORY_TRACT | 0 refills | Status: AC | PRN

## 2022-02-13 MED ORDER — IPRATROPIUM-ALBUTEROL 0.5-2.5 (3) MG/3ML IN SOLN
3.0000 mL | Freq: Once | RESPIRATORY_TRACT | Status: AC
  Administered 2022-02-13: 3 mL via RESPIRATORY_TRACT

## 2022-02-13 MED ORDER — PREDNISONE 10 MG (21) PO TBPK: ORAL_TABLET | ORAL | 0 refills | Status: DC

## 2022-02-13 MED ORDER — AMOXICILLIN-POT CLAVULANATE 875-125 MG PO TABS: 1.0000 | ORAL_TABLET | Freq: Two times a day (BID) | ORAL | 0 refills | Status: DC

## 2022-02-13 MED ORDER — IPRATROPIUM-ALBUTEROL 0.5-2.5 (3) MG/3ML IN SOLN
RESPIRATORY_TRACT | Status: AC
  Filled 2022-02-13: qty 3

## 2022-02-13 NOTE — ED Triage Notes (Signed)
Pt states cough,congestion,yellow phlegm and left ear clogged. States he took mucinex with no relief.

## 2022-02-13 NOTE — ED Provider Notes (Signed)
[place]    CSN: [id number] Arrival date & time: 02/13/22  0801      History   Chief Complaint Chief Complaint  Patient presents with   Cough    HPI Albert Brown is a 66 y.o. male.   Patient presents today with a several week history of URI symptoms including cough, congestion, subjective fever, wheezing, shortness of breath, chest tightness.  Denies any chest pain, headache, dizziness.  He has tried over-the-counter medication including Alka-Seltzer plus, sinus rinses without improvement of symptoms.  Denies any known sick contacts.  He does have a history of allergies but reports current symptoms are more extreme than previous episodes of this condition.  Denies history of asthma or COPD; reports he quit smoking 12 years ago.  He did take several doses of amoxicillin that he had leftover from a tooth infection without improvement of symptoms.  Denies additional antibiotic use in the past 90 days.  Denies history of diabetes or recent steroids.  He does have a history of HIV but is compliant with his medication and is undetectable based on last lab work from April 2023.  He is having difficulty with daily duties as a result of symptoms.    Past Medical History:  Diagnosis Date   ALLERGIC RHINITIS    Allergic sinusitis    Anxiety state, unspecified    CAD S/P percutaneous coronary angioplasty 08/08/2013   mRCA  Xience Alpine DES 2.75 mm x 33 mm (3.0 mm)   Cerumen impaction, right    DEPRESSION    Diarrhea    Erectile dysfunction    EXTERNAL HEMORRHOIDS WITHOUT MENTION COMP    GERD    Hiatal hernia    HIV DISEASE    HIV infection ([place])    HYPOGONADISM    Internal hemorrhoids    Non-ST elevated myocardial infarction (non-STEMI) ([place]) 08/08/2013   SubAcute100% RCA -- PCI; Echo: EF 55-605, Normal LV Size & Function - ~mild HK of Inferior Myocardium with mildly reduced RV function.  Normal Valve function   Periodontal disease    PERIPHERAL NEUROPATHY     PHARYNGITIS, RECURRENT    Pterygium of left eye    Tubular adenoma of colon    Urinary frequency    Urticaria    VERTIGO     Patient Active Problem List   Diagnosis Date Noted   Right lower quadrant abdominal pain 04/04/2021   Difficulty urinating 03/30/2021   Low back pain 03/30/2021   Diverticulitis 09/07/2020   Fatigue 07/23/2019   Cat scratch 06/16/2019   Tinnitus 04/20/2019   Encounter for screening colonoscopy 08/24/2018   Chronic urticaria 08/11/2018   Dyspnea on exertion 08/01/2017   Coronary artery disease involving native coronary artery of native heart with angina pectoris ([place]) 08/01/2017   Insomnia 04/30/2017   Essential hypertension 09/27/2016   Hepatitis B immune 07/31/2016   Hyperlipidemia LDL goal <70 12/05/2013   Dyslipidemia 08/10/2013   NSTEMI (non-ST elevated myocardial infarction) ([place]) 08/08/2013    Class: Hospitalized for   CAD S/P percutaneous coronary angioplasty - Xience outline DES to mid RCA (2.75 mm x 33 mm --> 3.0 mm) 08/08/2013    Class: History of   Presence of drug coated stent in right coronary artery 08/08/2013   Erectile dysfunction 01/18/2013   Pterygium eye, left 08/03/2012   Cerumen impaction, right 07/25/2011   Periodontal disease 08/21/2010   Allergic sinusitis 08/21/2010   Hypotestosteronism 09/20/2009   Anxiety state 09/20/2009   URINARY FREQUENCY 03/16/2009  PHARYNGITIS, RECURRENT 10/06/2008   EXTERNAL HEMORRHOIDS WITHOUT MENTION COMP 09/15/2008   DIARRHEA 09/15/2008   Human immunodeficiency virus (HIV) disease ([place]) 08/04/2006   Depression 08/04/2006   PERIPHERAL NEUROPATHY 08/04/2006   ALLERGIC RHINITIS 08/04/2006   GERD 08/04/2006   VERTIGO 08/04/2006    Past Surgical History:  Procedure Laterality Date   LEFT HEART CATHETERIZATION WITH CORONARY ANGIOGRAM N/A 08/08/2013   Procedure: LEFT HEART CATHETERIZATION WITH CORONARY ANGIOGRAM;  Surgeon: [name], MD;  Location[facility] C[facility] LAB;  Service: Cardiovascular;   Laterality: N/A;   PERCUTANEOUS CORONARY STENT INTERVENTION (PCI-S)  08/08/2013   mRCA 100% --> Xience Alpine DES 2.75 mm x 33 mm --> 3.0 mm       Home Medications    Prior to Admission medications   Medication Sig Start Date End Date Taking? Authorizing Provider  albuterol (VENTOLIN HFA) 108 (90 Base) MCG/ACT inhaler Inhale 2 puffs into the lungs every 6 (six) hours as needed for wheezing or shortness of breath. 02/13/22  Yes Ashford Clouse K, PA-C  amoxicillin-clavulanate (AUGMENTIN) 875-125 MG tablet Take 1 tablet by mouth every 12 (twelve) hours. 02/13/22  Yes Demontez Novack K, PA-C  fluticasone (FLONASE) 50 MCG/ACT nasal spray Place 1 spray into both nostrils daily. 02/13/22  Yes Ziasia Lenoir K, PA-C  predniSONE (STERAPRED UNI-PAK 21 TAB) 10 MG (21) TBPK tablet As directed 02/13/22  Yes Kendria Halberg K, PA-C  aspirin EC 81 MG tablet Take 1 tablet (81 mg total) by mouth daily. 01/02/16   Wende Bushy, MD  cetirizine (ZYRTEC ALLERGY) 10 MG tablet Take 1 tablet (10 mg total) by mouth daily. 12/14/20   Jaynee Eagles, PA-C  Coenzyme Q10 (COQ-10) 50 MG CAPS Take 1 tablet by mouth daily. Patient not taking: Reported on 06/28/2021    [provider]  emtricitabine-rilpivir-tenofovir AF (ODEFSEY) 200-25-25 MG TABS tablet Take 1 tablet by mouth daily with breakfast. 01/01/22   Tommy Medal, Lavell Islam, MD  hydrOXYzine (ATARAX/VISTARIL) 25 MG tablet TAKE 1 TABLET(25 MG) BY MOUTH THREE TIMES DAILY AS NEEDED 10/28/20   Maudie Mercury, MD  ibuprofen (ADVIL) 800 MG tablet Take 1 tablet (800 mg total) by mouth 3 (three) times daily. 03/01/21   White, Leitha Schuller, NP  lisinopril (ZESTRIL) 20 MG tablet Take 1 tablet (20 mg total) by mouth daily. Patient not taking: Reported on 03/01/2021 10/28/20   Maudie Mercury, MD  Multiple Vitamin (MULTI VITAMIN MENS PO) Take 1 tablet by mouth daily.    [provider]  nitroGLYCERIN (NITROSTAT) 0.4 MG SL tablet Place 1 tablet (0.4 mg total) under the tongue every 5  (five) minutes as needed for chest pain. Patient not taking: Reported on 06/28/2021 01/16/15   [name], MD  NON FORMULARY Has had aleve, alka seltzer    [provider]  nystatin (MYCOSTATIN) 100000 UNIT/ML suspension Take 5 mLs (500,000 Units total) by mouth 4 (four) times daily. 03/01/21   White, Leitha Schuller, NP  promethazine (PHENERGAN) 25 MG tablet Take 1 tablet (25 mg total) by mouth every 8 (eight) hours as needed for nausea or vomiting. 04/04/21 05/04/21  Orvis Brill, MD  simvastatin (ZOCOR) 20 MG tablet TAKE 1 TABLET(20 MG) BY MOUTH EVERY EVENING 10/16/21   Dorethea Clan, DO    Family History Family History  Problem Relation Age of Onset   Cancer Mother        brain tumor   Prostate cancer Father    Osteosarcoma Father    Alcohol abuse Father  Prostate cancer Brother    Diabetes Brother    Colon cancer Neg Hx    Esophageal cancer Neg Hx    Liver cancer Neg Hx    Rectal cancer Neg Hx    Stomach cancer Neg Hx     Social History Social History   Tobacco Use   Smoking status: Former    Types: Cigarettes    Quit date: 03/02/2007    Years since quitting: 14.9   Smokeless tobacco: Never   Tobacco comments:    pt. no longer smokes  Vaping Use   Vaping Use: Never used  Substance Use Topics   Alcohol use: Yes    Alcohol/week: 8.0 standard drinks of alcohol    Types: 8 Cans of beer per week    Comment: sometimes.   Drug use: Not Currently    Types: Marijuana    Comment: daily      Allergies   Brilinta [ticagrelor], Codeine, Bactrim [sulfamethoxazole-trimethoprim], Ciprofloxacin, Dapsone, Pravastatin, and Sulfamethoxazole-trimethoprim   Review of Systems Review of Systems  Constitutional:  Positive for activity change, fatigue and fever (subjective). Negative for appetite change.  HENT:  Positive for congestion and sinus pressure. Negative for sneezing and sore throat.   Eyes:  Negative for visual disturbance.  Respiratory:  Positive for cough,  chest tightness, shortness of breath and wheezing.   Cardiovascular:  Negative for chest pain.  Gastrointestinal:  Negative for abdominal pain, diarrhea, nausea and vomiting.  Neurological:  Negative for dizziness and headaches.     Physical Exam Triage Vital Signs ED Triage Vitals  Enc Vitals Group     BP 02/13/22 0825 (!) 185/104     Pulse Rate 02/13/22 0825 81     Resp 02/13/22 0825 16     Temp 02/13/22 0825 98.1 F (36.7 C)     Temp Source 02/13/22 0825 Oral     SpO2 02/13/22 0825 96 %     Weight --      Height --      Head Circumference --      Peak Flow --      Pain Score 02/13/22 0827 0     Pain Loc --      Pain Edu? --      Excl. in [place]? --    No data found.  Updated Vital Signs BP (!) 170/109 (BP Location: Left Arm)   Pulse 75   Temp 98.1 F (36.7 C) (Oral)   Resp 16   SpO2 96%   Visual Acuity Right Eye Distance:   Left Eye Distance:   Bilateral Distance:    Right Eye Near:   Left Eye Near:    Bilateral Near:     Physical Exam Vitals reviewed.  Constitutional:      General: He is awake.     Appearance: Normal appearance. He is well-developed. He is not ill-appearing.     Comments: Very pleasant male appears stated age in no acute distress sitting comfortably in exam room  HENT:     Head: Normocephalic and atraumatic.     Right Ear: Tympanic membrane, ear canal and external ear normal. Tympanic membrane is not erythematous or bulging.     Left Ear: Ear canal and external ear normal. Tympanic membrane is retracted.     Nose:     Right Sinus: Maxillary sinus tenderness present. No frontal sinus tenderness.     Left Sinus: Maxillary sinus tenderness present. No frontal sinus tenderness.     Mouth/Throat:  Pharynx: Uvula midline. Posterior oropharyngeal erythema present. No oropharyngeal exudate.     Comments: Erythema and drainage in posterior oropharynx Cardiovascular:     Rate and Rhythm: Normal rate and regular rhythm.     Heart sounds: No  murmur heard. Pulmonary:     Effort: Pulmonary effort is normal. No accessory muscle usage or respiratory distress.     Breath sounds: No stridor. Wheezing and rhonchi present. No rales.     Comments: Widespread wheezing and rhonchi Neurological:     Mental Status: He is alert.  Psychiatric:        Behavior: Behavior is cooperative.      UC Treatments / Results  Labs (all labs ordered are listed, but only abnormal results are displayed) Labs Reviewed - No data to display  EKG   Radiology DG Chest 2 View  Result Date: 02/13/2022 CLINICAL DATA:  worsening cough EXAM: CHEST - 2 VIEW COMPARISON:  08/07/2013 FINDINGS: Cardiac silhouette is unremarkable. No pneumothorax or pleural effusion. The lungs are clear. The visualized skeletal structures are unremarkable. IMPRESSION: No acute cardiopulmonary process. Electronically Signed   By: Sammie Bench M.D.   On: 02/13/2022 09:04    Procedures Procedures (including critical care time)  Medications Ordered in UC Medications  ipratropium-albuterol (DUONEB) 0.5-2.5 (3) MG/3ML nebulizer solution 3 mL (3 mLs Nebulization Given 02/13/22 0901)    Initial Impression / Assessment and Plan / UC Course  I have reviewed the triage vital signs and the nursing notes.  Pertinent labs & imaging results that were available during my care of the patient were reviewed by me and considered in my medical decision making (see chart for details).     Patient is well-appearing, afebrile, nontoxic, nontachycardic.  No indication for viral testing as he has been symptomatic for several weeks.  Given prolonged and worsening symptoms will cover for secondary bacterial infection with Augmentin twice daily for 7 days.  Chest x-ray was obtained given history of HIV with persistent cough that showed no acute cardiopulmonary disease.  He did have significant wheezing on exam that improved with DuoNeb in clinic.  He was sent home with albuterol with instruction to  use this every 4-6 hours as needed.  Will start prednisone taper and he was instructed to avoid NSAIDs with this medication due to risk of GI bleeding.  Discussed that left ear discomfort is likely related to eustachian tube dysfunction and he was encouraged to use allergy medication including Flonase which was sent to his pharmacy.  He can continue sinus rinses as well as Mucinex for additional symptom relief.  He is to rest and drink plenty of fluid.  If he has any worsening symptoms he needs to be seen immediately including chest pain, shortness of breath, worsening cough, fever, nausea/vomiting interfere with oral intake.  Strict return precautions given.  Work excuse note provided.  Blood pressure is very elevated today.  Patient denies any signs/symptoms of endorgan damage.  Reports that he has not been taking lisinopril regularly but does have this at home.  He is agreeable to taking the medication as soon as he gets home and monitor his blood pressure at home.  If he develops any chest pain, shortness of breath, headache, vision change, dizziness in the setting of high blood pressure he is to go to the emergency room.  Recommended that he avoid NSAIDs, decongestants, caffeine, sodium.  He is to follow-up with either our clinic or primary care later this week for blood pressure recheck.  Final Clinical Impressions(s) / UC Diagnoses   Final diagnoses:  Sinobronchitis  Bronchospasm  Elevated blood pressure reading with diagnosis of hypertension     Discharge Instructions      Your chest x-ray looked normal.  I believe that you have a sinus/bronchitis infection.  Please start Augmentin twice daily.  Use Flonase to help with your ear as I believe this is related to eustachian tube dysfunction.  Continue your sinus rinses.  Use albuterol every 4-6 hours as needed for shortness of breath and coughing fits.  Start prednisone as prescribed.  Do not take NSAIDs with this medication including aspirin,  ibuprofen/Advil, naproxen/Aleve as a cause stomach bleeding.  You can use acetaminophen/Tylenol.  Continue Mucinex.  Make sure you rest and drink plenty of fluid.  If your symptoms are not improving or if anything worsens you need to be reevaluated.  Your blood pressure is very elevated.  Please go home and take your lisinopril.  Avoid NSAIDs (aspirin, ibuprofen/Advil, naproxen/Aleve), decongestants, caffeine, sodium.  Monitor your blood pressure at home.  If you develop any chest pain, shortness of breath, headache, vision change, dizziness in the setting of high blood pressure you need to go to the emergency room.  Follow-up with your primary care or our clinic by early next week for recheck.     ED Prescriptions     Medication Sig Dispense Auth. Provider   fluticasone (FLONASE) 50 MCG/ACT nasal spray Place 1 spray into both nostrils daily. 16 g Albert Brown K, PA-C   albuterol (VENTOLIN HFA) 108 (90 Base) MCG/ACT inhaler Inhale 2 puffs into the lungs every 6 (six) hours as needed for wheezing or shortness of breath. 18 g Albert Brown K, PA-C   predniSONE (STERAPRED UNI-PAK 21 TAB) 10 MG (21) TBPK tablet As directed 21 tablet Albert Brown K, PA-C   amoxicillin-clavulanate (AUGMENTIN) 875-125 MG tablet Take 1 tablet by mouth every 12 (twelve) hours. 14 tablet Albert Brown, Derry Skill, PA-C      PDMP not reviewed this encounter.   Albert Croak, PA-C 02/13/22 [id number]

## 2022-02-13 NOTE — Discharge Instructions (Signed)
Your chest x-ray looked normal.  I believe that you have a sinus/bronchitis infection.  Please start Augmentin twice daily.  Use Flonase to help with your ear as I believe this is related to eustachian tube dysfunction.  Continue your sinus rinses.  Use albuterol every 4-6 hours as needed for shortness of breath and coughing fits.  Start prednisone as prescribed.  Do not take NSAIDs with this medication including aspirin, ibuprofen/Advil, naproxen/Aleve as a cause stomach bleeding.  You can use acetaminophen/Tylenol.  Continue Mucinex.  Make sure you rest and drink plenty of fluid.  If your symptoms are not improving or if anything worsens you need to be reevaluated.  Your blood pressure is very elevated.  Please go home and take your lisinopril.  Avoid NSAIDs (aspirin, ibuprofen/Advil, naproxen/Aleve), decongestants, caffeine, sodium.  Monitor your blood pressure at home.  If you develop any chest pain, shortness of breath, headache, vision change, dizziness in the setting of high blood pressure you need to go to the emergency room.  Follow-up with your primary care or our clinic by early next week for recheck.

## 2022-02-21 ENCOUNTER — Emergency Department (HOSPITAL_COMMUNITY): Payer: No Typology Code available for payment source

## 2022-02-21 ENCOUNTER — Emergency Department (HOSPITAL_COMMUNITY)
Admission: EM | Admit: 2022-02-21 | Discharge: 2022-02-21 | Disposition: A | Discharge status: Home / Self Care | Payer: No Typology Code available for payment source | Attending: Emergency Medicine | Admitting: Emergency Medicine

## 2022-02-21 ENCOUNTER — Other Ambulatory Visit: Payer: Self-pay

## 2022-02-21 DIAGNOSIS — Z79899 Other long term (current) drug therapy: Secondary | ICD-10-CM | POA: Diagnosis not present

## 2022-02-21 DIAGNOSIS — S92315A Nondisplaced fracture of first metatarsal bone, left foot, initial encounter for closed fracture: Secondary | ICD-10-CM | POA: Insufficient documentation

## 2022-02-21 DIAGNOSIS — S92309A Fracture of unspecified metatarsal bone(s), unspecified foot, initial encounter for closed fracture: Secondary | ICD-10-CM

## 2022-02-21 DIAGNOSIS — S63501A Unspecified sprain of right wrist, initial encounter: Secondary | ICD-10-CM | POA: Insufficient documentation

## 2022-02-21 DIAGNOSIS — S92342A Displaced fracture of fourth metatarsal bone, left foot, initial encounter for closed fracture: Secondary | ICD-10-CM | POA: Insufficient documentation

## 2022-02-21 DIAGNOSIS — S92332A Displaced fracture of third metatarsal bone, left foot, initial encounter for closed fracture: Secondary | ICD-10-CM | POA: Insufficient documentation

## 2022-02-21 DIAGNOSIS — S92322A Displaced fracture of second metatarsal bone, left foot, initial encounter for closed fracture: Secondary | ICD-10-CM | POA: Insufficient documentation

## 2022-02-21 DIAGNOSIS — S99922A Unspecified injury of left foot, initial encounter: Secondary | ICD-10-CM | POA: Diagnosis present

## 2022-02-21 MED ORDER — OXYCODONE-ACETAMINOPHEN 5-325 MG PO TABS: 1.0000 | ORAL_TABLET | Freq: Four times a day (QID) | ORAL | 0 refills | Status: DC | PRN
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
## 2022-03-13 ENCOUNTER — Other Ambulatory Visit: Payer: Self-pay

## 2022-03-13 DIAGNOSIS — B20 Human immunodeficiency virus [HIV] disease: Secondary | ICD-10-CM

## 2022-03-13 DIAGNOSIS — Z113 Encounter for screening for infections with a predominantly sexual mode of transmission: Secondary | ICD-10-CM

## 2022-03-13 DIAGNOSIS — Z79899 Other long term (current) drug therapy: Secondary | ICD-10-CM

## 2022-03-18 ENCOUNTER — Other Ambulatory Visit: Payer: 59

## 2022-04-02 ENCOUNTER — Other Ambulatory Visit: Payer: 59

## 2022-04-03 ENCOUNTER — Ambulatory Visit: Payer: 59 | Admitting: Infectious Disease

## 2022-05-02 ENCOUNTER — Other Ambulatory Visit: Payer: Self-pay

## 2022-05-02 DIAGNOSIS — B20 Human immunodeficiency virus [HIV] disease: Secondary | ICD-10-CM

## 2022-05-02 MED ORDER — ODEFSEY 200-25-25 MG PO TABS: 1.0000 | ORAL_TABLET | Freq: Every day | ORAL | 0 refills | Status: DC

## 2022-05-08 ENCOUNTER — Other Ambulatory Visit (HOSPITAL_COMMUNITY)
Admission: RE | Admit: 2022-05-08 | Discharge: 2022-05-08 | Disposition: A | Discharge status: Home / Self Care | Payer: Commercial Managed Care - HMO | Source: Ambulatory Visit | Attending: Internal Medicine | Admitting: Internal Medicine

## 2022-05-08 ENCOUNTER — Other Ambulatory Visit: Payer: Commercial Managed Care - HMO

## 2022-05-08 ENCOUNTER — Other Ambulatory Visit: Payer: Self-pay

## 2022-05-08 DIAGNOSIS — Z79899 Other long term (current) drug therapy: Secondary | ICD-10-CM

## 2022-05-08 DIAGNOSIS — Z113 Encounter for screening for infections with a predominantly sexual mode of transmission: Secondary | ICD-10-CM

## 2022-05-08 DIAGNOSIS — B20 Human immunodeficiency virus [HIV] disease: Secondary | ICD-10-CM

## 2022-05-09 LAB — T-HELPER CELL (CD4) - (RCID CLINIC ONLY)
CD4 % Helper T Cell: 29 % — ABNORMAL LOW (ref 33–65)
CD4 T Cell Abs: 641 /uL (ref 400–1790)

## 2022-05-09 LAB — URINE CYTOLOGY ANCILLARY ONLY
Chlamydia: NEGATIVE
Comment: NEGATIVE
Comment: NORMAL
Neisseria Gonorrhea: NEGATIVE

## 2022-05-10 LAB — CBC WITH DIFFERENTIAL/PLATELET
Absolute Monocytes: 870 cells/uL (ref 200–950)
Basophils Absolute: 8 cells/uL (ref 0–200)
Basophils Relative: 0.1 %
Eosinophils Absolute: 210 cells/uL (ref 15–500)
Eosinophils Relative: 2.8 %
HCT: 44.4 % (ref 38.5–50.0)
Hemoglobin: 15.4 g/dL (ref 13.2–17.1)
Lymphs Abs: 2445 cells/uL (ref 850–3900)
MCH: 32.1 pg (ref 27.0–33.0)
MCHC: 34.7 g/dL (ref 32.0–36.0)
MCV: 92.5 fL (ref 80.0–100.0)
MPV: 11 fL (ref 7.5–12.5)
Monocytes Relative: 11.6 %
Neutro Abs: 3968 cells/uL (ref 1500–7800)
Neutrophils Relative %: 52.9 %
Platelets: 191 10*3/uL (ref 140–400)
RBC: 4.8 10*6/uL (ref 4.20–5.80)
RDW: 12.1 % (ref 11.0–15.0)
Total Lymphocyte: 32.6 %
WBC: 7.5 10*3/uL (ref 3.8–10.8)

## 2022-05-10 LAB — LIPID PANEL
Cholesterol: 152 mg/dL (ref <200)
HDL: 50 mg/dL (ref >=40)
LDL Cholesterol (Calc): 73 mg/dL (calc)
Non-HDL Cholesterol (Calc): 102 mg/dL (calc) (ref <130)
Total CHOL/HDL Ratio: 3 (calc) (ref <5.0)
Triglycerides: 194 mg/dL — ABNORMAL HIGH (ref <150)

## 2022-05-10 LAB — COMPLETE METABOLIC PANEL WITH GFR
AG Ratio: 1.8 (calc) (ref 1.0–2.5)
ALT: 25 U/L (ref 9–46)
AST: 20 U/L (ref 10–35)
Albumin: 4.5 g/dL (ref 3.6–5.1)
Alkaline phosphatase (APISO): 98 U/L (ref 35–144)
BUN: 20 mg/dL (ref 7–25)
CO2: 27 mmol/L (ref 20–32)
Calcium: 9.7 mg/dL (ref 8.6–10.3)
Chloride: 107 mmol/L (ref 98–110)
Creat: 1.09 mg/dL (ref 0.70–1.35)
Globulin: 2.5 g/dL (calc) (ref 1.9–3.7)
Glucose, Bld: 81 mg/dL (ref 65–99)
Potassium: 4.9 mmol/L (ref 3.5–5.3)
Sodium: 141 mmol/L (ref 135–146)
Total Bilirubin: 0.4 mg/dL (ref 0.2–1.2)
Total Protein: 7 g/dL (ref 6.1–8.1)
eGFR: 76 mL/min/{1.73_m2} (ref >=60)

## 2022-05-10 LAB — HIV-1 RNA QUANT-NO REFLEX-BLD
HIV 1 RNA Quant: NOT DETECTED Copies/mL
HIV-1 RNA Quant, Log: NOT DETECTED Log cps/mL

## 2022-05-10 LAB — RPR: RPR Ser Ql: NONREACTIVE

## 2022-05-22 ENCOUNTER — Other Ambulatory Visit: Payer: Self-pay

## 2022-05-22 ENCOUNTER — Encounter: Payer: Self-pay | Admitting: Infectious Disease

## 2022-05-22 ENCOUNTER — Ambulatory Visit (INDEPENDENT_AMBULATORY_CARE_PROVIDER_SITE_OTHER): Payer: Commercial Managed Care - HMO | Admitting: Infectious Disease

## 2022-05-22 VITALS — BP 138/84 | HR 73 | Resp 16 | Ht 68.0 in | Wt 161.0 lb

## 2022-05-22 DIAGNOSIS — Z23 Encounter for immunization: Secondary | ICD-10-CM

## 2022-05-22 DIAGNOSIS — R5383 Other fatigue: Secondary | ICD-10-CM

## 2022-05-22 DIAGNOSIS — I2510 Atherosclerotic heart disease of native coronary artery without angina pectoris: Secondary | ICD-10-CM | POA: Diagnosis not present

## 2022-05-22 DIAGNOSIS — G4700 Insomnia, unspecified: Secondary | ICD-10-CM

## 2022-05-22 DIAGNOSIS — Z9861 Coronary angioplasty status: Secondary | ICD-10-CM

## 2022-05-22 DIAGNOSIS — E785 Hyperlipidemia, unspecified: Secondary | ICD-10-CM | POA: Diagnosis not present

## 2022-05-22 DIAGNOSIS — B20 Human immunodeficiency virus [HIV] disease: Secondary | ICD-10-CM

## 2022-05-22 DIAGNOSIS — K219 Gastro-esophageal reflux disease without esophagitis: Secondary | ICD-10-CM

## 2022-05-22 DIAGNOSIS — F331 Major depressive disorder, recurrent, moderate: Secondary | ICD-10-CM

## 2022-05-22 MED ORDER — ODEFSEY 200-25-25 MG PO TABS: 1.0000 | ORAL_TABLET | Freq: Every day | ORAL | 11 refills | Status: DC

## 2022-05-22 MED ORDER — ROSUVASTATIN CALCIUM 20 MG PO TABS: 20.0000 mg | ORAL_TABLET | Freq: Every day | ORAL | 11 refills | Status: DC

## 2022-05-22 NOTE — Progress Notes (Signed)
Subjective:  Chief complaint:  fatigue  Patient ID: Albert Brown, male    DOB: 01-24-1956, 67 y.o.   MRN: [id number]  HPI  67 year old man with HIV that is been typically well-controlled previously followed by my partner Dr. Johnnye Sima with following regimens triumeq --> complera--> odefsy---> dovato--> back to [facility]  He does have comorbid coronary artery disease anxiety allergic rhinitis gastroesophageal reflux disease (latter which is a problem with Odefsey)  He tells me that he was switched back off of Dovato to [facility] due to symptoms of fatigue.  He did still does have fatigue despite this switch.  He tells me that he is been under considerable stress related to his job and need to travel back and forth.  He is having trouble with sleep though and such as trazodone Atarax have not helped in the past he says that Xanax that he uses infrequently in the past did help.  I told him that if we were going to go to a benzodiazepine he would need to get this in primary care to the fact that we are no longer initiating controlled substances in our clinic in patients who have not been grandfathered in and were on it before.  He does have known coronary disease as mentioned and his cardiologist Dr. Marlou Porch and wished him to be on Crestor but he had not been able to afford it.  I would think that the Crestor if filled through [facility] [address] [place] or [facility] order pharmacy should be covered as it is on the HIV medication assistance program.  One of the fact that his Vernell Leep needs to be taken with food.  He has been taking it at bedtime an hour or so after he eats sometimes more.  He claims he was not told that he needs to take it with food I again also emphasized the fact that he cannot be on proton pump inhibitors or H2 blockers at all while on Odefsey.  If he takes Tums they need to be spent spaced out many hours afterwards and I prefer that he not be on them.  Certainly if the  gastroesophageal reflux worsens we will need to switch him back off of Odefsey as it really does require a quite acidic environment for the recovery to be properly absorbed.    Past Medical History:  Diagnosis Date   ALLERGIC RHINITIS    Allergic sinusitis    Anxiety state, unspecified    CAD S/P percutaneous coronary angioplasty 08/08/2013   mRCA  Xience Alpine DES 2.75 mm x 33 mm (3.0 mm)   Cerumen impaction, right    DEPRESSION    Diarrhea    Erectile dysfunction    EXTERNAL HEMORRHOIDS WITHOUT MENTION COMP    GERD    Hiatal hernia    HIV DISEASE    HIV infection ([place])    HYPOGONADISM    Internal hemorrhoids    Non-ST elevated myocardial infarction (non-STEMI) ([place]) 08/08/2013   SubAcute100% RCA -- PCI; Echo: EF 55-605, Normal LV Size & Function - ~mild HK of Inferior Myocardium with mildly reduced RV function.  Normal Valve function   Periodontal disease    PERIPHERAL NEUROPATHY    PHARYNGITIS, RECURRENT    Pterygium of left eye    Tubular adenoma of colon    Urinary frequency    Urticaria    VERTIGO     Past Surgical History:  Procedure Laterality Date   LEFT HEART CATHETERIZATION WITH CORONARY ANGIOGRAM N/A 08/08/2013  Procedure: LEFT HEART CATHETERIZATION WITH CORONARY ANGIOGRAM;  Surgeon: [name], MD;  Location: [facility] CATH LAB;  Service: Cardiovascular;  Laterality: N/A;   PERCUTANEOUS CORONARY STENT INTERVENTION (PCI-S)  08/08/2013   mRCA 100% --> Xience Alpine DES 2.75 mm x 33 mm --> 3.0 mm    Family History  Problem Relation Age of Onset   Cancer Mother        brain tumor   Prostate cancer Father    Osteosarcoma Father    Alcohol abuse Father    Prostate cancer Brother    Diabetes Brother    Colon cancer Neg Hx    Esophageal cancer Neg Hx    Liver cancer Neg Hx    Rectal cancer Neg Hx    Stomach cancer Neg Hx       Social History   Socioeconomic History   Marital status: Single    Spouse name: Not on file   Number of children: Not on file    Years of education: Not on file   Highest education level: Not on file  Occupational History   Not on file  Tobacco Use   Smoking status: Former    Types: Cigarettes    Quit date: 03/02/2007    Years since quitting: 15.2   Smokeless tobacco: Never   Tobacco comments:    pt. no longer smokes  Vaping Use   Vaping Use: Never used  Substance and Sexual Activity   Alcohol use: Yes    Alcohol/week: 8.0 standard drinks of alcohol    Types: 8 Cans of beer per week    Comment: sometimes.   Drug use: Not Currently    Types: Marijuana    Comment: daily    Sexual activity: Not Currently    Partners: Male    Comment: declined condoms  Other Topics Concern   Not on file  Social History Narrative   Not on file   Social Determinants of Health   Financial Resource Strain: Not on file  Food Insecurity: Not on file  Transportation Needs: Not on file  Physical Activity: Not on file  Stress: Not on file  Social Connections: Not on file    Allergies  Allergen Reactions   Brilinta [Ticagrelor] Shortness Of Breath    BREATHING ISSUES   Codeine Anaphylaxis   Bactrim [Sulfamethoxazole-Trimethoprim]     "Pt stated he died when taking this"   Ciprofloxacin     REACTION: rash   Dapsone     REACTION: severe-anaphylaxis   Pravastatin Other (See Comments)    Muscle cramps   Sulfamethoxazole-Trimethoprim     Anaphylaxis      Current Outpatient Medications:    albuterol (VENTOLIN HFA) 108 (90 Base) MCG/ACT inhaler, Inhale 2 puffs into the lungs every 6 (six) hours as needed for wheezing or shortness of breath., Disp: 18 g, Rfl: 0   amoxicillin-clavulanate (AUGMENTIN) 875-125 MG tablet, Take 1 tablet by mouth every 12 (twelve) hours., Disp: 14 tablet, Rfl: 0   aspirin EC 81 MG tablet, Take 1 tablet (81 mg total) by mouth daily., Disp: 90 tablet, Rfl: 3   cetirizine (ZYRTEC ALLERGY) 10 MG tablet, Take 1 tablet (10 mg total) by mouth daily., Disp: 30 tablet, Rfl: 0   Coenzyme Q10 (COQ-10)  50 MG CAPS, Take 1 tablet by mouth daily. (Patient not taking: Reported on 06/28/2021), Disp: , Rfl:    emtricitabine-rilpivir-tenofovir AF (ODEFSEY) 200-25-25 MG TABS tablet, Take 1 tablet by mouth daily with breakfast., Disp: 30 tablet, Rfl:  0   fluticasone (FLONASE) 50 MCG/ACT nasal spray, Place 1 spray into both nostrils daily., Disp: 16 g, Rfl: 0   hydrOXYzine (ATARAX/VISTARIL) 25 MG tablet, TAKE 1 TABLET(25 MG) BY MOUTH THREE TIMES DAILY AS NEEDED, Disp: 90 tablet, Rfl: 1   ibuprofen (ADVIL) 800 MG tablet, Take 1 tablet (800 mg total) by mouth 3 (three) times daily., Disp: 21 tablet, Rfl: 0   lisinopril (ZESTRIL) 20 MG tablet, Take 1 tablet (20 mg total) by mouth daily. (Patient not taking: Reported on 03/01/2021), Disp: 90 tablet, Rfl: 3   Multiple Vitamin (MULTI VITAMIN MENS PO), Take 1 tablet by mouth daily., Disp: , Rfl:    nitroGLYCERIN (NITROSTAT) 0.4 MG SL tablet, Place 1 tablet (0.4 mg total) under the tongue every 5 (five) minutes as needed for chest pain. (Patient not taking: Reported on 06/28/2021), Disp: 25 tablet, Rfl: 3   NON FORMULARY, Has had aleve, alka seltzer, Disp: , Rfl:    nystatin (MYCOSTATIN) 100000 UNIT/ML suspension, Take 5 mLs (500,000 Units total) by mouth 4 (four) times daily., Disp: 60 mL, Rfl: 0   oxyCODONE-acetaminophen (PERCOCET/ROXICET) 5-325 MG tablet, Take 1 tablet by mouth every 6 (six) hours as needed for severe pain., Disp: 15 tablet, Rfl: 0   predniSONE (STERAPRED UNI-PAK 21 TAB) 10 MG (21) TBPK tablet, As directed, Disp: 21 tablet, Rfl: 0   promethazine (PHENERGAN) 25 MG tablet, Take 1 tablet (25 mg total) by mouth every 8 (eight) hours as needed for nausea or vomiting., Disp: 20 tablet, Rfl: 0   simvastatin (ZOCOR) 20 MG tablet, TAKE 1 TABLET(20 MG) BY MOUTH EVERY EVENING, Disp: 30 tablet, Rfl: 11   Review of Systems  Constitutional:  Positive for fatigue. Negative for activity change, appetite change, chills, diaphoresis, fever and unexpected weight change.   HENT:  Negative for congestion, rhinorrhea, sinus pressure, sneezing, sore throat and trouble swallowing.   Eyes:  Negative for photophobia and visual disturbance.  Respiratory:  Negative for cough, chest tightness, shortness of breath, wheezing and stridor.   Cardiovascular:  Negative for chest pain, palpitations and leg swelling.  Gastrointestinal:  Negative for abdominal distention, abdominal pain, anal bleeding, blood in stool, constipation, diarrhea, nausea and vomiting.  Genitourinary:  Negative for difficulty urinating, dysuria, flank pain and hematuria.  Musculoskeletal:  Negative for arthralgias, back pain, gait problem, joint swelling and myalgias.  Skin:  Negative for color change, pallor, rash and wound.  Neurological:  Negative for dizziness, tremors, weakness and light-headedness.  Hematological:  Negative for adenopathy. Does not bruise/bleed easily.  Psychiatric/Behavioral:  Positive for dysphoric mood and sleep disturbance. Negative for agitation, behavioral problems and confusion.        Objective:   Physical Exam Constitutional:      Appearance: He is well-developed.  HENT:     Head: Normocephalic and atraumatic.  Eyes:     Conjunctiva/sclera: Conjunctivae normal.  Cardiovascular:     Rate and Rhythm: Normal rate and regular rhythm.  Pulmonary:     Effort: Pulmonary effort is normal. No respiratory distress.     Breath sounds: No wheezing.  Abdominal:     General: There is no distension.     Palpations: Abdomen is soft.  Musculoskeletal:        General: No tenderness. Normal range of motion.     Cervical back: Normal range of motion and neck supple.  Skin:    General: Skin is warm and dry.     Coloration: Skin is not pale.  Findings: No erythema or rash.  Neurological:     General: No focal deficit present.     Mental Status: He is alert and oriented to person, place, and time.  Psychiatric:        Mood and Affect: Mood normal.        Behavior:  Behavior normal.        Thought Content: Thought content normal.        Judgment: Judgment normal.           Assessment & Plan:    HIV disease:  I have reviewed Kyheem H Colcord's labs including viral load which was  Lab Results  Component Value Date   HIV1RNAQUANT Not Detected 05/08/2022   and cd4 which was  Lab Results  Component Value Date   CD4TABS 641 05/08/2022     I am continuing patient's prescription for  [facility] with chewable food and avoidance of antacids  Hyperlipidemia: Will send Crestor into the mail order pharmacy and I would think it would be covered if you are having difficulty with this but we can troubleshoot this with Butch Penny.  GERD: if he needs PPI or H2 MUST GET HIM OFF OF ODEFSEY or will risk neurological failure with resistance.  CAD: continue lisinopril and initiating Crestor and stopping Zocor.  Should follow-up with Dr. Marlou Porch  Depression: he will think about various SSRI's. He is concerned about becoming  'addicted" to medicines though these drugs are not nearly as addictive as benzodiazepenes  At this point to defer to primary care and physical if they feel that he should benefit from a benzodiazepine periodically  Vaccine counseling: Recommended updated Prevnar 20  which he received   I spent 46 minutes with the patient including than 50% of the time in face to face counseling of the patient running his HIV coronary disease hyperlipidemia depression insomnia fatigue along with review of medical records in preparation for the visit and during the visit and in coordination of his care.

## 2022-09-30 ENCOUNTER — Ambulatory Visit: Payer: Commercial Managed Care - HMO | Admitting: Infectious Disease

## 2022-09-30 ENCOUNTER — Other Ambulatory Visit: Payer: Self-pay

## 2022-09-30 ENCOUNTER — Other Ambulatory Visit: Payer: Managed Care, Other (non HMO)

## 2022-09-30 DIAGNOSIS — B20 Human immunodeficiency virus [HIV] disease: Secondary | ICD-10-CM

## 2022-09-30 LAB — CBC WITH DIFFERENTIAL/PLATELET
Absolute Monocytes: 749 cells/uL (ref 200–950)
Basophils Absolute: 7 cells/uL (ref 0–200)
Basophils Relative: 0.1 %
Eosinophils Absolute: 182 cells/uL (ref 15–500)
Eosinophils Relative: 2.6 %
HCT: 47 % (ref 38.5–50.0)
Hemoglobin: 15.8 g/dL (ref 13.2–17.1)
Lymphs Abs: 1603 cells/uL (ref 850–3900)
MCH: 31.7 pg (ref 27.0–33.0)
MCHC: 33.6 g/dL (ref 32.0–36.0)
MCV: 94.2 fL (ref 80.0–100.0)
MPV: 11.5 fL (ref 7.5–12.5)
Monocytes Relative: 10.7 %
Neutro Abs: 4459 cells/uL (ref 1500–7800)
Neutrophils Relative %: 63.7 %
Platelets: 178 10*3/uL (ref 140–400)
RBC: 4.99 10*6/uL (ref 4.20–5.80)
RDW: 12.4 % (ref 11.0–15.0)
Total Lymphocyte: 22.9 %
WBC: 7 10*3/uL (ref 3.8–10.8)

## 2022-10-15 ENCOUNTER — Other Ambulatory Visit: Payer: Self-pay

## 2022-10-15 ENCOUNTER — Ambulatory Visit: Payer: Commercial Managed Care - HMO | Admitting: Infectious Disease

## 2022-10-15 ENCOUNTER — Encounter: Payer: Self-pay | Admitting: Infectious Disease

## 2022-10-15 VITALS — BP 151/98 | HR 69 | Temp 98.7°F | Wt 184.0 lb

## 2022-10-15 DIAGNOSIS — R5383 Other fatigue: Secondary | ICD-10-CM

## 2022-10-15 DIAGNOSIS — G4700 Insomnia, unspecified: Secondary | ICD-10-CM

## 2022-10-15 DIAGNOSIS — I25119 Atherosclerotic heart disease of native coronary artery with unspecified angina pectoris: Secondary | ICD-10-CM | POA: Diagnosis not present

## 2022-10-15 DIAGNOSIS — E785 Hyperlipidemia, unspecified: Secondary | ICD-10-CM

## 2022-10-15 DIAGNOSIS — F419 Anxiety disorder, unspecified: Secondary | ICD-10-CM

## 2022-10-15 DIAGNOSIS — B20 Human immunodeficiency virus [HIV] disease: Secondary | ICD-10-CM

## 2022-10-15 DIAGNOSIS — F32A Depression, unspecified: Secondary | ICD-10-CM

## 2022-10-15 MED ORDER — MIRTAZAPINE 15 MG PO TABS: 15.0000 mg | ORAL_TABLET | Freq: Every day | ORAL | 5 refills | Status: DC

## 2022-10-15 MED ORDER — ODEFSEY 200-25-25 MG PO TABS
1.0000 | ORAL_TABLET | Freq: Every day | ORAL | 11 refills | Status: DC
Start: 2022-10-15 — End: 2023-11-03

## 2022-10-15 NOTE — Progress Notes (Signed)
Subjective:  Chief complaint: Fatigue again along with depressive symptoms difficulty sleeping anxiety   Patient ID: Albert Brown, male    DOB: 27-Oct-1955, 67 y.o.   MRN: [id number]  HPI  67 year old man with HIV that is been typically well-controlled previously followed by my partner Dr. Ninetta Lights with following regimens triumeq --> complera--> odefsy---> dovato--> back to [facility]  He does have comorbid coronary artery disease anxiety allergic rhinitis gastroesophageal reflux disease (latter which is a problem with Odefsey)  He tells me that he was switched back off of Dovato to [facility] due to symptoms of fatigue.  He did still does have fatigue despite this switch.  He told  me that he is been under considerable stress related to his job and need to travel back and forth.  He is having trouble with sleep though and such as trazodone Atarax have not helped in the past he says that Xanax that he uses infrequently in the past did help.  I told him at last visit that if we were going to go to a benzodiazepine he would need to get this in primary care to the fact that we are no longer initiating controlled substances in our clinic in patients who have not been grandfathered in and were on it before.  He returns today to clinic and again is can continuing complain of fatigue and also depressive symptoms and difficulty going to sleep at night.  Does not drink much alcohol he does not get on his phone or computer screen at night but he always has difficulty sleeping.  He has not found trazodone helpful.  Again I have directed him to the possibility of him being on a benzodiazepine via primary care physician or psychiatrist we can certainly start Remeron and try this and see if this helps with his sleep and his depressive symptoms as well.     Past Medical History:  Diagnosis Date   ALLERGIC RHINITIS    Allergic sinusitis    Anxiety state, unspecified    CAD S/P percutaneous coronary  angioplasty 08/08/2013   mRCA  Xience Alpine DES 2.75 mm x 33 mm (3.0 mm)   Cerumen impaction, right    DEPRESSION    Diarrhea    Erectile dysfunction    EXTERNAL HEMORRHOIDS WITHOUT MENTION COMP    GERD    Hiatal hernia    HIV DISEASE    HIV infection ([facility])    HYPOGONADISM    Internal hemorrhoids    Non-ST elevated myocardial infarction (non-STEMI) ([facility]) 08/08/2013   SubAcute100% RCA -- PCI; Echo: EF 55-605, Normal LV Size & Function - ~mild HK of Inferior Myocardium with mildly reduced RV function.  Normal Valve function   Periodontal disease    PERIPHERAL NEUROPATHY    PHARYNGITIS, RECURRENT    Pterygium of left eye    Tubular adenoma of colon    Urinary frequency    Urticaria    VERTIGO     Past Surgical History:  Procedure Laterality Date   LEFT HEART CATHETERIZATION WITH CORONARY ANGIOGRAM N/A 08/08/2013   Procedure: LEFT HEART CATHETERIZATION WITH CORONARY ANGIOGRAM;  Surgeon: Marykay Lex, MD;  Location: [facility] CATH LAB;  Service: Cardiovascular;  Laterality: N/A;   PERCUTANEOUS CORONARY STENT INTERVENTION (PCI-S)  08/08/2013   mRCA 100% --> Xience Alpine DES 2.75 mm x 33 mm --> 3.0 mm    Family History  Problem Relation Age of Onset   Cancer Mother        brain tumor  Prostate cancer Father    Osteosarcoma Father    Alcohol abuse Father    Prostate cancer Brother    Diabetes Brother    Colon cancer Neg Hx    Esophageal cancer Neg Hx    Liver cancer Neg Hx    Rectal cancer Neg Hx    Stomach cancer Neg Hx       Social History   Socioeconomic History   Marital status: Single    Spouse name: Not on file   Number of children: Not on file   Years of education: Not on file   Highest education level: Not on file  Occupational History   Not on file  Tobacco Use   Smoking status: Former    Current packs/day: 0.00    Types: Cigarettes    Quit date: 03/02/2007    Years since quitting: 15.6    Passive exposure: Never   Smokeless tobacco: Never   Tobacco  comments:    pt. no longer smokes  Vaping Use   Vaping status: Never Used  Substance and Sexual Activity   Alcohol use: Yes    Alcohol/week: 8.0 standard drinks of alcohol    Types: 8 Cans of beer per week    Comment: sometimes.   Drug use: Not Currently    Types: Marijuana    Comment: daily    Sexual activity: Not Currently    Partners: Male    Comment: declined condoms  Other Topics Concern   Not on file  Social History Narrative   Not on file   Social Determinants of Health   Financial Resource Strain: Not on file  Food Insecurity: Not on file  Transportation Needs: Not on file  Physical Activity: Not on file  Stress: Not on file  Social Connections: Not on file    Allergies  Allergen Reactions   Brilinta [Ticagrelor] Shortness Of Breath    BREATHING ISSUES   Codeine Anaphylaxis   Pepcid [Famotidine] Other (See Comments)    He is on Odefsey   Prilosec [Omeprazole] Other (See Comments)    He is on Odefsey   Bactrim [Sulfamethoxazole-Trimethoprim]     "Pt stated he died when taking this"   Ciprofloxacin     REACTION: rash   Dapsone     REACTION: severe-anaphylaxis   Pravastatin Other (See Comments)    Muscle cramps   Sulfamethoxazole-Trimethoprim     Anaphylaxis      Current Outpatient Medications:    albuterol (VENTOLIN HFA) 108 (90 Base) MCG/ACT inhaler, Inhale 2 puffs into the lungs every 6 (six) hours as needed for wheezing or shortness of breath., Disp: 18 g, Rfl: 0   amoxicillin-clavulanate (AUGMENTIN) 875-125 MG tablet, Take 1 tablet by mouth every 12 (twelve) hours. (Patient not taking: Reported on 05/22/2022), Disp: 14 tablet, Rfl: 0   aspirin EC 81 MG tablet, Take 1 tablet (81 mg total) by mouth daily., Disp: 90 tablet, Rfl: 3   cetirizine (ZYRTEC ALLERGY) 10 MG tablet, Take 1 tablet (10 mg total) by mouth daily., Disp: 30 tablet, Rfl: 0   Coenzyme Q10 (COQ-10) 50 MG CAPS, Take 1 tablet by mouth daily., Disp: , Rfl:     emtricitabine-rilpivir-tenofovir AF (ODEFSEY) 200-25-25 MG TABS tablet, Take 1 tablet by mouth daily with breakfast., Disp: 30 tablet, Rfl: 11   fluticasone (FLONASE) 50 MCG/ACT nasal spray, Place 1 spray into both nostrils daily., Disp: 16 g, Rfl: 0   hydrOXYzine (ATARAX/VISTARIL) 25 MG tablet, TAKE 1 TABLET(25 MG) BY MOUTH THREE  TIMES DAILY AS NEEDED (Patient not taking: Reported on 05/22/2022), Disp: 90 tablet, Rfl: 1   ibuprofen (ADVIL) 800 MG tablet, Take 1 tablet (800 mg total) by mouth 3 (three) times daily., Disp: 21 tablet, Rfl: 0   lisinopril (ZESTRIL) 20 MG tablet, Take 1 tablet (20 mg total) by mouth daily., Disp: 90 tablet, Rfl: 3   Multiple Vitamin (MULTI VITAMIN MENS PO), Take 1 tablet by mouth daily., Disp: , Rfl:    nitroGLYCERIN (NITROSTAT) 0.4 MG SL tablet, Place 1 tablet (0.4 mg total) under the tongue every 5 (five) minutes as needed for chest pain. (Patient not taking: Reported on 05/22/2022), Disp: 25 tablet, Rfl: 3   NON FORMULARY, Has had aleve, alka seltzer, Disp: , Rfl:    nystatin (MYCOSTATIN) 100000 UNIT/ML suspension, Take 5 mLs (500,000 Units total) by mouth 4 (four) times daily., Disp: 60 mL, Rfl: 0   oxyCODONE-acetaminophen (PERCOCET/ROXICET) 5-325 MG tablet, Take 1 tablet by mouth every 6 (six) hours as needed for severe pain. (Patient not taking: Reported on 05/22/2022), Disp: 15 tablet, Rfl: 0   predniSONE (STERAPRED UNI-PAK 21 TAB) 10 MG (21) TBPK tablet, As directed (Patient not taking: Reported on 05/22/2022), Disp: 21 tablet, Rfl: 0   promethazine (PHENERGAN) 25 MG tablet, Take 1 tablet (25 mg total) by mouth every 8 (eight) hours as needed for nausea or vomiting. (Patient not taking: Reported on 05/22/2022), Disp: 20 tablet, Rfl: 0   rosuvastatin (CRESTOR) 20 MG tablet, Take 1 tablet (20 mg total) by mouth daily. DC simvastatin, Disp: 30 tablet, Rfl: 11   Review of Systems  Constitutional:  Positive for fatigue. Negative for activity change, appetite change,  chills, diaphoresis, fever and unexpected weight change.  HENT:  Negative for congestion, rhinorrhea, sinus pressure, sneezing, sore throat and trouble swallowing.   Eyes:  Negative for photophobia and visual disturbance.  Respiratory:  Negative for cough, chest tightness, shortness of breath, wheezing and stridor.   Cardiovascular:  Negative for chest pain, palpitations and leg swelling.  Gastrointestinal:  Negative for abdominal distention, abdominal pain, anal bleeding, blood in stool, constipation, diarrhea, nausea and vomiting.  Genitourinary:  Negative for difficulty urinating, dysuria, flank pain and hematuria.  Musculoskeletal:  Negative for arthralgias, back pain, gait problem, joint swelling and myalgias.  Skin:  Negative for color change, pallor, rash and wound.  Neurological:  Negative for dizziness, tremors, weakness and light-headedness.  Hematological:  Negative for adenopathy. Does not bruise/bleed easily.  Psychiatric/Behavioral:  Positive for dysphoric mood and sleep disturbance. Negative for agitation, behavioral problems and confusion.        Objective:   Physical Exam Constitutional:      Appearance: He is well-developed.  HENT:     Head: Normocephalic and atraumatic.  Eyes:     Conjunctiva/sclera: Conjunctivae normal.  Cardiovascular:     Rate and Rhythm: Normal rate and regular rhythm.  Pulmonary:     Effort: Pulmonary effort is normal. No respiratory distress.     Breath sounds: No wheezing.  Abdominal:     General: There is no distension.     Palpations: Abdomen is soft.  Musculoskeletal:        General: No tenderness. Normal range of motion.     Cervical back: Normal range of motion and neck supple.  Skin:    General: Skin is warm and dry.     Coloration: Skin is not pale.     Findings: No erythema or rash.  Neurological:     General: No focal  deficit present.     Mental Status: He is alert and oriented to person, place, and time.  Psychiatric:         Mood and Affect: Mood normal.        Behavior: Behavior normal.        Thought Content: Thought content normal.        Judgment: Judgment normal.           Assessment & Plan:    . HIV disease:  I have reviewed Albert Brown's labs including viral load which was  Lab Results  Component Value Date   HIV1RNAQUANT Not Detected 09/30/2022   and cd4 which was  Lab Results  Component Value Date   CD4TABS 585 09/30/2022     I am continuing patient's prescription for Odefsey  Hyperlipidemia: Continue Crestor:  Coronary disease continue lisinopril Crestor and follow-up with cardiology  Insomnia and depression: Will try Remeron have also refer him to psychiatry.

## 2022-10-21 ENCOUNTER — Ambulatory Visit: Payer: Commercial Managed Care - HMO | Admitting: Physician Assistant

## 2022-10-21 ENCOUNTER — Telehealth: Payer: Self-pay | Admitting: Cardiology

## 2022-10-21 DIAGNOSIS — I10 Essential (primary) hypertension: Secondary | ICD-10-CM

## 2022-10-21 MED ORDER — LISINOPRIL 20 MG PO TABS
20.0000 mg | ORAL_TABLET | Freq: Every day | ORAL | 0 refills | Status: DC
Start: 2022-10-21 — End: 2023-06-24

## 2022-10-21 NOTE — Telephone Encounter (Signed)
*  STAT* If patient is at the pharmacy, call can be transferred to refill team.   1. Which medications need to be refilled? (please list name of each medication and dose if known) Lisinopril   2. Would you like to learn more about the convenience, safety, & potential cost savings by using the [facility]?  3. Are you open to using the [facility] (Type [facility]   4. Which pharmacy/location (including street and city if local pharmacy) is medication to be sent to?[facility] [address], [place]   5. Do they need a 30 day or 90 day supply? Need enough until his appointment on 11-14-22

## 2022-11-14 ENCOUNTER — Ambulatory Visit (HOSPITAL_BASED_OUTPATIENT_CLINIC_OR_DEPARTMENT_OTHER): Payer: Commercial Managed Care - HMO | Admitting: Cardiology

## 2022-11-29 ENCOUNTER — Other Ambulatory Visit: Payer: Self-pay | Admitting: Cardiology

## 2022-11-29 DIAGNOSIS — I10 Essential (primary) hypertension: Secondary | ICD-10-CM

## 2023-01-02 ENCOUNTER — Encounter: Payer: Commercial Managed Care - HMO | Admitting: Student

## 2023-01-10 ENCOUNTER — Ambulatory Visit: Payer: Commercial Managed Care - HMO | Admitting: Physician Assistant

## 2023-01-15 ENCOUNTER — Encounter: Payer: Commercial Managed Care - HMO | Admitting: Student

## 2023-04-07 ENCOUNTER — Other Ambulatory Visit: Payer: Commercial Managed Care - HMO

## 2023-04-21 ENCOUNTER — Ambulatory Visit: Payer: Self-pay | Admitting: Infectious Disease

## 2023-04-30 ENCOUNTER — Telehealth: Payer: Self-pay

## 2023-04-30 ENCOUNTER — Encounter: Payer: Self-pay | Admitting: Infectious Disease

## 2023-04-30 NOTE — Telephone Encounter (Signed)
 Patient walked into the office requesting letter from Dr. Daiva Eves requesting exemption from jury duty.  Is struggling with anxiety/ depression. Is not seeing counselor at this time due to insurance coverage.  Would like letter as soon as possible if possible.  Albert Brown, RMA

## 2023-04-30 NOTE — Telephone Encounter (Signed)
 Note reprinted and left upfront  for pt to pickup. Juanita Laster, RMA

## 2023-05-23 ENCOUNTER — Other Ambulatory Visit: Payer: Self-pay | Admitting: Infectious Disease

## 2023-05-23 NOTE — Telephone Encounter (Signed)
**Note De-identified  [place] Obfuscation** Please advise 

## 2023-05-27 ENCOUNTER — Other Ambulatory Visit: Payer: Self-pay

## 2023-05-27 ENCOUNTER — Ambulatory Visit: Payer: Self-pay | Admitting: Infectious Disease

## 2023-05-27 DIAGNOSIS — Z79899 Other long term (current) drug therapy: Secondary | ICD-10-CM

## 2023-05-27 DIAGNOSIS — B20 Human immunodeficiency virus [HIV] disease: Secondary | ICD-10-CM

## 2023-05-27 DIAGNOSIS — Z113 Encounter for screening for infections with a predominantly sexual mode of transmission: Secondary | ICD-10-CM

## 2023-06-17 ENCOUNTER — Other Ambulatory Visit

## 2023-06-17 ENCOUNTER — Other Ambulatory Visit: Payer: Self-pay

## 2023-06-17 DIAGNOSIS — B20 Human immunodeficiency virus [HIV] disease: Secondary | ICD-10-CM

## 2023-06-17 DIAGNOSIS — Z113 Encounter for screening for infections with a predominantly sexual mode of transmission: Secondary | ICD-10-CM

## 2023-06-17 DIAGNOSIS — Z79899 Other long term (current) drug therapy: Secondary | ICD-10-CM

## 2023-06-17 NOTE — Addendum Note (Signed)
 Addended by: Maebelle Schmid on: 06/17/2023 02:43 PM   Modules accepted: Orders

## 2023-06-18 LAB — C. TRACHOMATIS/N. GONORRHOEAE RNA
C. trachomatis RNA, TMA: NOT DETECTED
N. gonorrhoeae RNA, TMA: NOT DETECTED

## 2023-06-19 LAB — CBC WITH DIFFERENTIAL/PLATELET
Absolute Lymphocytes: 2161 {cells}/uL (ref 850–3900)
Absolute Monocytes: 681 {cells}/uL (ref 200–950)
Basophils Absolute: 0 {cells}/uL (ref 0–200)
Basophils Relative: 0 %
Eosinophils Absolute: 192 {cells}/uL (ref 15–500)
Eosinophils Relative: 2.6 %
HCT: 48.4 % (ref 38.5–50.0)
Hemoglobin: 16.5 g/dL (ref 13.2–17.1)
MCH: 31.8 pg (ref 27.0–33.0)
MCHC: 34.1 g/dL (ref 32.0–36.0)
MCV: 93.3 fL (ref 80.0–100.0)
MPV: 11.1 fL (ref 7.5–12.5)
Monocytes Relative: 9.2 %
Neutro Abs: 4366 {cells}/uL (ref 1500–7800)
Neutrophils Relative %: 59 %
Platelets: 183 10*3/uL (ref 140–400)
RBC: 5.19 10*6/uL (ref 4.20–5.80)
RDW: 12.1 % (ref 11.0–15.0)
Total Lymphocyte: 29.2 %
WBC: 7.4 10*3/uL (ref 3.8–10.8)

## 2023-06-19 LAB — LIPID PANEL
Cholesterol: 152 mg/dL (ref <200)
HDL: 53 mg/dL (ref >=40)
LDL Cholesterol (Calc): 79 mg/dL
Non-HDL Cholesterol (Calc): 99 mg/dL (ref <130)
Total CHOL/HDL Ratio: 2.9 (calc) (ref <5.0)
Triglycerides: 121 mg/dL (ref <150)

## 2023-06-19 LAB — COMPLETE METABOLIC PANEL WITHOUT GFR
AG Ratio: 2 (calc) (ref 1.0–2.5)
ALT: 22 U/L (ref 9–46)
AST: 21 U/L (ref 10–35)
Albumin: 4.7 g/dL (ref 3.6–5.1)
Alkaline phosphatase (APISO): 87 U/L (ref 35–144)
BUN: 17 mg/dL (ref 7–25)
CO2: 27 mmol/L (ref 20–32)
Calcium: 10.1 mg/dL (ref 8.6–10.3)
Chloride: 105 mmol/L (ref 98–110)
Creat: 1.07 mg/dL (ref 0.70–1.35)
Globulin: 2.3 g/dL (ref 1.9–3.7)
Glucose, Bld: 84 mg/dL (ref 65–99)
Potassium: 4.5 mmol/L (ref 3.5–5.3)
Sodium: 140 mmol/L (ref 135–146)
Total Bilirubin: 0.6 mg/dL (ref 0.2–1.2)
Total Protein: 7 g/dL (ref 6.1–8.1)

## 2023-06-19 LAB — T-HELPER CELLS (CD4) COUNT (NOT AT ARMC)
Absolute CD4: 698 {cells}/uL (ref 490–1740)
CD4 T Helper %: 32 % (ref 30–61)
Total lymphocyte count: 2159 {cells}/uL (ref 850–3900)

## 2023-06-19 LAB — HIV-1 RNA QUANT-NO REFLEX-BLD
HIV 1 RNA Quant: NOT DETECTED {copies}/mL
HIV-1 RNA Quant, Log: NOT DETECTED {Log_copies}/mL

## 2023-06-19 LAB — RPR: RPR Ser Ql: NONREACTIVE

## 2023-06-24 ENCOUNTER — Ambulatory Visit: Payer: Self-pay

## 2023-06-24 ENCOUNTER — Ambulatory Visit: Admitting: Student

## 2023-06-24 VITALS — BP 159/93 | HR 75 | Temp 98.2°F | Ht 68.0 in | Wt 179.0 lb

## 2023-06-24 DIAGNOSIS — I10 Essential (primary) hypertension: Secondary | ICD-10-CM

## 2023-06-24 DIAGNOSIS — F32A Depression, unspecified: Secondary | ICD-10-CM

## 2023-06-24 DIAGNOSIS — S60465A Insect bite (nonvenomous) of left ring finger, initial encounter: Secondary | ICD-10-CM

## 2023-06-24 DIAGNOSIS — K219 Gastro-esophageal reflux disease without esophagitis: Secondary | ICD-10-CM | POA: Diagnosis not present

## 2023-06-24 DIAGNOSIS — R5383 Other fatigue: Secondary | ICD-10-CM

## 2023-06-24 DIAGNOSIS — F419 Anxiety disorder, unspecified: Secondary | ICD-10-CM

## 2023-06-24 DIAGNOSIS — R972 Elevated prostate specific antigen [PSA]: Secondary | ICD-10-CM

## 2023-06-24 DIAGNOSIS — W57XXXA Bitten or stung by nonvenomous insect and other nonvenomous arthropods, initial encounter: Secondary | ICD-10-CM

## 2023-06-24 MED ORDER — BUPROPION HCL ER (XL) 150 MG PO TB24: 150.0000 mg | ORAL_TABLET | Freq: Every day | ORAL | 0 refills | Status: DC

## 2023-06-24 MED ORDER — LISINOPRIL 20 MG PO TABS: 20.0000 mg | ORAL_TABLET | Freq: Every day | ORAL | 0 refills | Status: DC

## 2023-06-24 NOTE — Progress Notes (Unsigned)
 Established Patient Office Visit  Subjective   Patient ID: Albert Brown, male    DOB: [birth year]  Age: 68 y.o. MRN: [id number]  Chief Complaint  Patient presents with   finger problem    C/o redness left hand (ring finger)  Insect bite?    Nausea    Nausea and fatigue "long time"      HPI This is a 68 year old male living with a history stated below and presents today for an acute visit, for an insect bite. Please see problem based assessment and plan for additional details.  PMHx of anxiety, HIV, HLD    Past Medical History:  Diagnosis Date   ALLERGIC RHINITIS    Allergic sinusitis    Anxiety state, unspecified    CAD S/P percutaneous coronary angioplasty 08/08/2013   mRCA  Xience Alpine DES 2.75 mm x 33 mm (3.0 mm)   Cerumen impaction, right    DEPRESSION    Diarrhea    Erectile dysfunction    EXTERNAL HEMORRHOIDS WITHOUT MENTION COMP    GERD    Hiatal hernia    HIV DISEASE    HIV infection (HCC)    HYPOGONADISM    Internal hemorrhoids    Non-ST elevated myocardial infarction (non-STEMI) (HCC) 08/08/2013   SubAcute100% RCA -- PCI; Echo: EF 55-605, Normal LV Size & Function - ~mild HK of Inferior Myocardium with mildly reduced RV function.  Normal Valve function   Periodontal disease    PERIPHERAL NEUROPATHY    PHARYNGITIS, RECURRENT    Pterygium of left eye    Tubular adenoma of colon    Urinary frequency    Urticaria    VERTIGO     ROS   As per assessment and plan  Objective:     BP (!) 169/91 (BP Location: Right Arm, Patient Position: Sitting, Cuff Size: Normal)   Pulse 77   Temp 98.2 F (36.8 C) (Oral)   Ht 5\' 8"  (1.727 m)   Wt 179 lb (81.2 kg)   SpO2 98%   BMI 27.22 kg/m  BP Readings from Last 3 Encounters:  06/24/23 (!) 169/91  10/15/22 (!) 151/98  05/22/22 138/84   Wt Readings from Last 3 Encounters:  06/24/23 179 lb (81.2 kg)  10/15/22 184 lb (83.5 kg)  05/22/22 161 lb (73 kg)   SpO2 Readings from Last 3 Encounters:  06/24/23  98%  10/15/22 95%  05/22/22 98%      Physical Exam  General: Sitting in chair, no acute distress Cardiovascular: Regular rate, no murmurs appreciated Pulmonary: Breathing comfortably, no wheezing or crackles Abdomen: Soft, nontender, nondistended, bowel sounds present MSK: Range of motion intact, no lower extremity edema  Last metabolic panel Lab Results  Component Value Date   GLUCOSE 84 06/17/2023   NA 140 06/17/2023   K 4.5 06/17/2023   CL 105 06/17/2023   CO2 27 06/17/2023   BUN 17 06/17/2023   CREATININE 1.07 06/17/2023   EGFR 83 09/30/2022   CALCIUM  10.1 06/17/2023   PROT 7.0 06/17/2023   ALBUMIN 4.9 (H) 04/04/2021   LABGLOB 2.4 04/04/2021   AGRATIO 2.0 04/04/2021   BILITOT 0.6 06/17/2023   ALKPHOS 92 04/04/2021   AST 21 06/17/2023   ALT 22 06/17/2023     Lab Results  Component Value Date   TSH 3.750 04/04/2021   Lab Results  Component Value Date   WBC 7.4 06/17/2023   HGB 16.5 06/17/2023   HCT 48.4 06/17/2023   MCV 93.3 06/17/2023  PLT 183 06/17/2023   Lab Results  Component Value Date   IRON 84 04/04/2021   TIBC 319 04/04/2021   FERRITIN 155 04/04/2021     The ASCVD Risk score (Arnett DK, et al., 2019) failed to calculate for the following reasons:   Risk score cannot be calculated because patient has a medical history suggesting prior/existing ASCVD    Assessment & Plan:  Insect Bite Last week, was outside, plants. Noticed swelling with Pus. No numbness and tingling. - Continue warm compressors  -    Fatigue Previously evaluated in 2023 and 2021, with work up consisted of TSH, CBC, iron studies, and BMP that were all normal. He follows ID regularly for HIV treatment where SSRI's were discussed to underlying depression.   Reports affecting day to day life, unable to work, insomnia, feeling agitated and on the edge.   Denies any recent fevers, changes in weight, chest pain or shortness of breath.   PHQ 9 score = 15   ; GAD 7 score 11    Nausea EGD: 11/2019:  Z- line irregular, 39 cm from the incisors. Biopsied. - Low- grade of narrowing Schatzki ring. Dilated to 16 mm with balloon. - 2 cm hiatal hernia. - Gastritis. Biopsied.  Daily in the morning, sometimes in the afternoon. Does have acid reflux   - Protonix  40 mg daily (Odesify)    - GI follow up   HTN BP Readings from Last 3 Encounters:  06/24/23 (!) 169/91  10/15/22 (!) 151/98  05/22/22 138/84   - Lisinopril  20 mg daily   Problem List Items Addressed This Visit   None   No follow-ups on file.    Lanney Pitts, DO

## 2023-06-24 NOTE — Telephone Encounter (Signed)
 Chief Complaint: Insect Bite Symptoms: nausea, fatigue Frequency: x 1 week Pertinent Negatives: Patient denies vomiting, CP Disposition: [] ED /[] Urgent Care (no appt availability in office) / [x] Appointment(In office/virtual)/ []  [facility]/ [] Home Care/ [] Refused Recommended Disposition /[] [facility]/ []  Follow-up with PCP Additional Notes: Pt reports he was bitten to top left ring finger last Monday, notes he attempted to drain it but the swelling, redness/purple have not resolved. Pt notes he believes he had fever but did not measure. Notes increasing nausea and fatigue but is unsure if they are related. OV scheduled. This RN educated pt on home care, new-worsening symptoms, when to call back/seek emergent care. Pt verbalized understanding and agrees to plan.    Copied from CRM [id number]. Topic: Clinical - Red Word Triage >> Jun 24, 2023  9:46 AM Albert Brown wrote: [facility] that prompted transfer to Nurse Triage: Patient was bitten by insect, lanced it drained the infection from the site, but has been feeling nauseous, extremely fatigue and not sure if its from the insect bit Reason for Disposition  [1] Red or very tender (to touch) area AND [2] getting larger over 48 hours after the bite  Answer Assessment - Initial Assessment Questions 1. TYPE of INSECT: "What type of insect was it?"      Unknown 2. ONSET: "When did you get bitten?"      Monday last week 3. LOCATION: "Where is the insect bite located?"      Ring finger on left hand 4. REDNESS: "Is the area red or pink?" If Yes, ask: "What size is area of redness?" (inches or cm). "When did the redness start?"     Purplish/redness 5. PAIN: "Is there any pain?" If Yes, ask: "How bad is it?"  (Scale 1-10; or mild, moderate, severe)     None 6. ITCHING: "Does it itch?" If Yes, ask: "How bad is the itch?"    - MILD: doesn't interfere with normal activities   - MODERATE-SEVERE: interferes with work, school, sleep, or  other activities      Mild 7. SWELLING: "How big is the swelling?" (inches, cm, or compare to coins)     Dime size swelling 8. OTHER SYMPTOMS: "Do you have any other symptoms?"  (e.g., difficulty breathing, hives)     Nausea, fatigue, draining  Protocols used: Insect Bite-A-AH

## 2023-06-24 NOTE — Patient Instructions (Addendum)
 Thank you, Mr.Arlow H Dippolito for allowing us  to provide your care today. Today we discussed:  START Wellbutrin 150 mg, one tablet by mouth daily for 30 days  [facility]  [phone]   I have ordered the following labs for you:  Lab Orders         PSA       Referrals ordered today:   Referral Orders  No referral(s) requested today     I have ordered the following medication/changed the following medications:   Stop the following medications: Medications Discontinued During This Encounter  Medication Reason   amoxicillin -clavulanate (AUGMENTIN ) 875-125 MG tablet    hydrOXYzine  (ATARAX /VISTARIL ) 25 MG tablet    oxyCODONE -acetaminophen  (PERCOCET/ROXICET) 5-325 MG tablet    nitroGLYCERIN  (NITROSTAT ) 0.4 MG SL tablet    predniSONE  (STERAPRED UNI-PAK 21 TAB) 10 MG (21) TBPK tablet    promethazine  (PHENERGAN ) 25 MG tablet    lisinopril  (ZESTRIL ) 20 MG tablet Reorder     Start the following medications: Meds ordered this encounter  Medications   buPROPion (WELLBUTRIN XL) 150 MG 24 hr tablet    Sig: Take 1 tablet (150 mg total) by mouth daily.    Dispense:  30 tablet    Refill:  0   lisinopril  (ZESTRIL ) 20 MG tablet    Sig: Take 1 tablet (20 mg total) by mouth daily.    Dispense:  30 tablet    Refill:  0    CAN ONLY OK 30 DAYS .Aaron Aas PT CAN GET YEAR REFILL AT UPCOMING APPT     Follow up:  1 month for PSA, depression, HTN     Remember:   Should you have any questions or concerns please call the internal medicine clinic at [phone].     Lanney Pitts, DO [facility]

## 2023-06-25 ENCOUNTER — Other Ambulatory Visit: Payer: Self-pay | Admitting: Student

## 2023-06-25 ENCOUNTER — Encounter: Payer: Self-pay | Admitting: Student

## 2023-06-25 DIAGNOSIS — C61 Malignant neoplasm of prostate: Secondary | ICD-10-CM | POA: Insufficient documentation

## 2023-06-25 DIAGNOSIS — R972 Elevated prostate specific antigen [PSA]: Secondary | ICD-10-CM | POA: Insufficient documentation

## 2023-06-25 DIAGNOSIS — W57XXXA Bitten or stung by nonvenomous insect and other nonvenomous arthropods, initial encounter: Secondary | ICD-10-CM | POA: Insufficient documentation

## 2023-06-25 LAB — PSA: Prostate Specific Ag, Serum: 8.4 ng/mL — ABNORMAL HIGH (ref 0.0–4.0)

## 2023-06-25 NOTE — Assessment & Plan Note (Signed)
 Per chart review, patient had an elevated PSA reading noted in 2023, which he did not follow back in our clinic.  Patient does report symptoms of increased urinary frequency, patient was advised to return back to the clinic so we can further discuss his underlying symptoms.  - Will check PSA today

## 2023-06-25 NOTE — Assessment & Plan Note (Signed)
 Reports last week he was outside, near plants. He noticed a swelling of his left ring finger, reports that there was a pus, which he used warm compressors that drained.  Denies any numbness tingling or tenderness.  Per exam, mild swelling is noted around the PIP joint of the left ring finger, nontender.  Healing well. -Patient is advised to use warm compressors and keep the skin dry and clean.

## 2023-06-25 NOTE — Assessment & Plan Note (Signed)
 BP Readings from Last 3 Encounters:  06/24/23 (!) 159/93  10/15/22 (!) 151/98  05/22/22 138/84   Patient reports that he has been out of his medication lisinopril  20 mg several months.  No current chest pain or headaches.  Will send refills of lisinopril  20 mg to the pharmacy, patient is advised to follow back with us  so we can further discuss hypertension and other chronic conditions.

## 2023-06-25 NOTE — Assessment & Plan Note (Signed)
 Previously evaluated in 2023 and 2021, with work up consisted of TSH, CBC, iron studies, and BMP that were all normal. He follows ID regularly for HIV treatment where SSRI's were discussed to underlying depression.  Presently, patient reports symptoms of unable to go to work, insomnia, feeling agitated and on the edge, that is affecting his day-to-day life.  States that he has been having the symptoms over the course of 3 years and it has not changed.  Denies any recent fevers, changes in weight, chest pain or shortness of breath.  In office, PHQ-9 score equals 15, GAD-7 score equals 11.  We discussed about several SSRIs, SNRIs; patient reports that he wanted a medication that would have least side effects on sexual dysfunction.  We talked about Wellbutrin, patient denies any previous history of seizures. - Start Wellbutrin 150 mg, take 1 tablet by mouth daily - Patient is advised to return in 1 month to reassess mood symptoms

## 2023-06-25 NOTE — Assessment & Plan Note (Signed)
 EGD: 11/2019:  Low- grade of narrowing Schatzki ring, 2 cm hiatal hernia and Gastritis.  Patient reports symptoms of chronic nausea over 6 months, daily in the morning and sometimes in the afternoon.  Does report burning sensation the back of his throat and history of acid reflux.  Patient reports that once he was on Protonix  40 mg however for the past couple of years he has not on this medication.  Patient takes Odefsey  (HIV) that has drug interaction with PPIs. -Will consult pharmacy team about which PPI is ok with HIV medication - Patient is provided contact number of the GI to follow-up

## 2023-06-26 NOTE — Telephone Encounter (Signed)
 Pt was sent a MyChar Message please see the message: Lanney Pitts, DO to [name], MD      06/25/23 10:30 AM Result Note I called this patient, went to voicemail. A referral to Urology is sent. PSA Lanney Pitts, DO to Albert Brown      06/25/23 10:30 AM Hey,    Your PSA was elevated, I sent a referral to Urology. Please be on the look out for their office reaching out to you for an appointment.   This MyChart message has not been read. PSA  Copied from CRM [id number]. Topic: Referral - Question >> Jun 25, 2023  3:37 PM [name] wrote: Reason for CRM: The patient returned Dr. Jan Mcgill missed call. Advised this is regarding his elevated PSA levels, informed the patient referral to [facility] has been placed. The patient does not have any additional questions at this time. Advised he will receive a direct call to schedule with the urologist.

## 2023-06-27 NOTE — Progress Notes (Signed)
 Internal Medicine Clinic Attending  Case discussed with the resident at the time of the visit.  We reviewed the resident's history and exam and pertinent patient test results.  I agree with the assessment, diagnosis, and plan of care documented in the resident's note.

## 2023-07-06 NOTE — Progress Notes (Deleted)
 Subjective:  Chief complaint: follow-up for HIV disease on medications   Patient ID: Albert Brown, male    DOB: Sep 29, 1955, 68 y.o.   MRN: [id number]  HPI   Past Medical History:  Diagnosis Date   ALLERGIC RHINITIS    Allergic sinusitis    Anxiety state, unspecified    CAD S/P percutaneous coronary angioplasty 08/08/2013   mRCA  Xience Alpine DES 2.75 mm x 33 mm (3.0 mm)   Cerumen impaction, right    DEPRESSION    Diarrhea    Erectile dysfunction    EXTERNAL HEMORRHOIDS WITHOUT MENTION COMP    GERD    Hiatal hernia    HIV DISEASE    HIV infection (HCC)    HYPOGONADISM    Internal hemorrhoids    Non-ST elevated myocardial infarction (non-STEMI) (HCC) 08/08/2013   SubAcute100% RCA -- PCI; Echo: EF 55-605, Normal LV Size & Function - ~mild HK of Inferior Myocardium with mildly reduced RV function.  Normal Valve function   Periodontal disease    PERIPHERAL NEUROPATHY    PHARYNGITIS, RECURRENT    Pterygium of left eye    Tubular adenoma of colon    Urinary frequency    Urticaria    VERTIGO     Past Surgical History:  Procedure Laterality Date   LEFT HEART CATHETERIZATION WITH CORONARY ANGIOGRAM N/A 08/08/2013   Procedure: LEFT HEART CATHETERIZATION WITH CORONARY ANGIOGRAM;  Surgeon: Arleen Lacer, MD;  Location: [facility] CATH LAB;  Service: Cardiovascular;  Laterality: N/A;   PERCUTANEOUS CORONARY STENT INTERVENTION (PCI-S)  08/08/2013   mRCA 100% --> Xience Alpine DES 2.75 mm x 33 mm --> 3.0 mm    Family History  Problem Relation Age of Onset   Cancer Mother        brain tumor   Prostate cancer Father    Osteosarcoma Father    Alcohol abuse Father    Prostate cancer Brother    Diabetes Brother    Colon cancer Neg Hx    Esophageal cancer Neg Hx    Liver cancer Neg Hx    Rectal cancer Neg Hx    Stomach cancer Neg Hx       Social History   Socioeconomic History   Marital status: Single    Spouse name: Not on file   Number of children: Not on file   Years of  education: Not on file   Highest education level: Not on file  Occupational History   Not on file  Tobacco Use   Smoking status: Former    Current packs/day: 0.00    Types: Cigarettes    Quit date: 03/02/2007    Years since quitting: 16.3    Passive exposure: Never   Smokeless tobacco: Never   Tobacco comments:    pt. no longer smokes  Vaping Use   Vaping status: Never Used  Substance and Sexual Activity   Alcohol use: Yes    Alcohol/week: 8.0 standard drinks of alcohol    Types: 8 Cans of beer per week    Comment: sometimes.   Drug use: Not Currently    Types: Marijuana    Comment: daily    Sexual activity: Not Currently    Partners: Male    Comment: declined condoms  Other Topics Concern   Not on file  Social History Narrative   Not on file   Social Drivers of Health   Financial Resource Strain: Not on file  Food Insecurity: Not on file  Transportation  Needs: Not on file  Physical Activity: Not on file  Stress: Not on file  Social Connections: Not on file    Allergies  Allergen Reactions   Brilinta  [Ticagrelor ] Shortness Of Breath    BREATHING ISSUES   Codeine Anaphylaxis   Pepcid [Famotidine] Other (See Comments)    He is on Odefsey    Prilosec [Omeprazole ] Other (See Comments)    He is on Odefsey    Bactrim [Sulfamethoxazole-Trimethoprim]     "Pt stated he died when taking this"   Ciprofloxacin     REACTION: rash   Dapsone     REACTION: severe-anaphylaxis   Pravastatin  Other (See Comments)    Muscle cramps   Sulfamethoxazole-Trimethoprim     Anaphylaxis      Current Outpatient Medications:    albuterol  (VENTOLIN  HFA) 108 (90 Base) MCG/ACT inhaler, Inhale 2 puffs into the lungs every 6 (six) hours as needed for wheezing or shortness of breath., Disp: 18 g, Rfl: 0   aspirin  EC 81 MG tablet, Take 1 tablet (81 mg total) by mouth daily., Disp: 90 tablet, Rfl: 3   buPROPion  (WELLBUTRIN  XL) 150 MG 24 hr tablet, Take 1 tablet (150 mg total) by mouth daily.,  Disp: 30 tablet, Rfl: 0   cetirizine  (ZYRTEC  ALLERGY ) 10 MG tablet, Take 1 tablet (10 mg total) by mouth daily., Disp: 30 tablet, Rfl: 0   Coenzyme Q10 (COQ-10) 50 MG CAPS, Take 1 tablet by mouth daily., Disp: , Rfl:    emtricitabine-rilpivir-tenofovir  AF (ODEFSEY ) 200-25-25 MG TABS tablet, Take 1 tablet by mouth daily with breakfast., Disp: 30 tablet, Rfl: 11   fluticasone  (FLONASE ) 50 MCG/ACT nasal spray, Place 1 spray into both nostrils daily., Disp: 16 g, Rfl: 0   ibuprofen  (ADVIL ) 800 MG tablet, Take 1 tablet (800 mg total) by mouth 3 (three) times daily., Disp: 21 tablet, Rfl: 0   lisinopril  (ZESTRIL ) 20 MG tablet, Take 1 tablet (20 mg total) by mouth daily., Disp: 30 tablet, Rfl: 0   mirtazapine  (REMERON ) 15 MG tablet, TAKE 1 TABLET(15 MG) BY MOUTH AT BEDTIME, Disp: 30 tablet, Rfl: 5   Multiple Vitamin (MULTI VITAMIN MENS PO), Take 1 tablet by mouth daily., Disp: , Rfl:    NON FORMULARY, Has had aleve, alka seltzer, Disp: , Rfl:    nystatin  (MYCOSTATIN ) 100000 UNIT/ML suspension, Take 5 mLs (500,000 Units total) by mouth 4 (four) times daily., Disp: 60 mL, Rfl: 0   rosuvastatin  (CRESTOR ) 20 MG tablet, Take 1 tablet (20 mg total) by mouth daily. DC simvastatin , Disp: 30 tablet, Rfl: 11    Review of Systems     Objective:   Physical Exam        Assessment & Plan:

## 2023-07-07 ENCOUNTER — Ambulatory Visit: Payer: Self-pay | Admitting: Infectious Disease

## 2023-07-07 DIAGNOSIS — E785 Hyperlipidemia, unspecified: Secondary | ICD-10-CM

## 2023-07-07 DIAGNOSIS — R972 Elevated prostate specific antigen [PSA]: Secondary | ICD-10-CM

## 2023-07-07 DIAGNOSIS — B20 Human immunodeficiency virus [HIV] disease: Secondary | ICD-10-CM

## 2023-07-07 DIAGNOSIS — I2510 Atherosclerotic heart disease of native coronary artery without angina pectoris: Secondary | ICD-10-CM

## 2023-07-20 ENCOUNTER — Other Ambulatory Visit: Payer: Self-pay | Admitting: Student

## 2023-07-20 DIAGNOSIS — I10 Essential (primary) hypertension: Secondary | ICD-10-CM

## 2023-07-22 NOTE — Telephone Encounter (Signed)
 Medication sent to pharmacy

## 2023-07-23 ENCOUNTER — Encounter: Admitting: Student

## 2023-07-23 NOTE — Progress Notes (Deleted)
 CC: ***  HPI:  Mr.Hillman H Depaola is a 68 y.o. male with a past medical history of CAD, hypertension, GERD who presents for 1 month follow-up. Please see assessment and plan for full HPI  Medications: Reactive airway disease: Albuterol  inhaler CAD: Aspirin  81 mg daily, Crestor  20 mg daily Depression: Wellbutrin  150 mg daily, mirtazapine  15 mg daily Allergic rhinitis: Zyrtec  10 mg daily, Flonase  1 spray daily HIV: Odefsey  1 tablet daily Hypertension: Lisinopril  20 mg daily  Patient was last seen in the clinic on 0/29/25.  At that time patient had elevated blood pressures and this was due to the fact that he was out of his lisinopril .  Did not make any changes at that time  For his GERD, he was provided GI contact number.  Unclear if we figure out which medicine was okay for PPI  Patient endorses fatigue and was started on Wellbutrin .  Patient had a previous elevated PSA.  They check PSA today.  Will need to send patient to urology probably  Patient had insect bite.  Past Medical History:  Diagnosis Date   ALLERGIC RHINITIS    Allergic sinusitis    Anxiety state, unspecified    CAD S/P percutaneous coronary angioplasty 08/08/2013   mRCA  Xience Alpine DES 2.75 mm x 33 mm (3.0 mm)   Cerumen impaction, right    DEPRESSION    Diarrhea    Erectile dysfunction    EXTERNAL HEMORRHOIDS WITHOUT MENTION COMP    GERD    Hiatal hernia    HIV DISEASE    HIV infection (HCC)    HYPOGONADISM    Internal hemorrhoids    Non-ST elevated myocardial infarction (non-STEMI) (HCC) 08/08/2013   SubAcute100% RCA -- PCI; Echo: EF 55-605, Normal LV Size & Function - ~mild HK of Inferior Myocardium with mildly reduced RV function.  Normal Valve function   Periodontal disease    PERIPHERAL NEUROPATHY    PHARYNGITIS, RECURRENT    Pterygium of left eye    Tubular adenoma of colon    Urinary frequency    Urticaria    VERTIGO      Current Outpatient Medications:    buPROPion  (WELLBUTRIN  XL)  150 MG 24 hr tablet, TAKE 1 TABLET(150 MG) BY MOUTH DAILY, Disp: 30 tablet, Rfl: 0   lisinopril  (ZESTRIL ) 20 MG tablet, TAKE 1 TABLET(20 MG) BY MOUTH DAILY, Disp: 30 tablet, Rfl: 0   albuterol  (VENTOLIN  HFA) 108 (90 Base) MCG/ACT inhaler, Inhale 2 puffs into the lungs every 6 (six) hours as needed for wheezing or shortness of breath., Disp: 18 g, Rfl: 0   aspirin  EC 81 MG tablet, Take 1 tablet (81 mg total) by mouth daily., Disp: 90 tablet, Rfl: 3   cetirizine  (ZYRTEC  ALLERGY ) 10 MG tablet, Take 1 tablet (10 mg total) by mouth daily., Disp: 30 tablet, Rfl: 0   Coenzyme Q10 (COQ-10) 50 MG CAPS, Take 1 tablet by mouth daily., Disp: , Rfl:    emtricitabine-rilpivir-tenofovir  AF (ODEFSEY ) 200-25-25 MG TABS tablet, Take 1 tablet by mouth daily with breakfast., Disp: 30 tablet, Rfl: 11   fluticasone  (FLONASE ) 50 MCG/ACT nasal spray, Place 1 spray into both nostrils daily., Disp: 16 g, Rfl: 0   ibuprofen  (ADVIL ) 800 MG tablet, Take 1 tablet (800 mg total) by mouth 3 (three) times daily., Disp: 21 tablet, Rfl: 0   mirtazapine  (REMERON ) 15 MG tablet, TAKE 1 TABLET(15 MG) BY MOUTH AT BEDTIME, Disp: 30 tablet, Rfl: 5   Multiple Vitamin (MULTI VITAMIN MENS PO),  Take 1 tablet by mouth daily., Disp: , Rfl:    NON FORMULARY, Has had aleve, alka seltzer, Disp: , Rfl:    nystatin  (MYCOSTATIN ) 100000 UNIT/ML suspension, Take 5 mLs (500,000 Units total) by mouth 4 (four) times daily., Disp: 60 mL, Rfl: 0   rosuvastatin  (CRESTOR ) 20 MG tablet, Take 1 tablet (20 mg total) by mouth daily. DC simvastatin , Disp: 30 tablet, Rfl: 11  Review of Systems:  ***  Constitutional: Eye: Respiratory: Cardiovascular: GI: MSK: GU: Skin: Neuro: Endocrine:   Physical Exam:  There were no vitals filed for this visit. *** General: Patient is sitting comfortably in the room  Eyes: Pupils equal and reactive to light, EOM intact  Head: Normocephalic, atraumatic  Neck: Supple, nontender, full range of motion, No JVD Cardio:  Regular rate and rhythm, no murmurs, rubs or gallops. 2+ pulses to bilateral upper and lower extremities  Chest: No chest tenderness Pulmonary: Clear to ausculation bilaterally with no rales, rhonchi, and crackles  Abdomen: Soft, nontender with normoactive bowel sounds with no rebound or guarding  Neuro: Alert and orientated x3. CN II-XII intact. Sensation intact to upper and lower extremities. 2+ patellar reflex.  Back: No midline tenderness, no step off or deformities noted. No paraspinal muscle tenderness.  Skin: No rashes noted  MSK: 5/5 strength to upper and lower extremities.    Assessment & Plan:   No problem-specific Assessment & Plan notes found for this encounter.    Patient {GC/GE:[id number]::"discussed with","seen with"} Dr. {NAMES:[id number]::"Guilloud","Hoffman","Mullen","Narendra","Williams","Vincent"}  Jonelle Neri, DO PGY-2 Internal Medicine Resident

## 2023-07-25 ENCOUNTER — Encounter: Payer: Self-pay | Admitting: Student

## 2023-08-14 ENCOUNTER — Ambulatory Visit: Payer: Self-pay | Admitting: Infectious Disease

## 2023-08-19 ENCOUNTER — Other Ambulatory Visit: Payer: Self-pay | Admitting: Student

## 2023-08-19 DIAGNOSIS — I10 Essential (primary) hypertension: Secondary | ICD-10-CM

## 2023-08-19 NOTE — Telephone Encounter (Signed)
 Medication sent to pharmacy

## 2023-08-25 ENCOUNTER — Ambulatory Visit: Admitting: Cardiology

## 2023-08-25 ENCOUNTER — Ambulatory Visit: Payer: Self-pay | Admitting: Student

## 2023-08-25 VITALS — BP 163/87 | HR 69 | Ht 68.0 in | Wt 174.0 lb

## 2023-08-25 DIAGNOSIS — R972 Elevated prostate specific antigen [PSA]: Secondary | ICD-10-CM

## 2023-08-25 DIAGNOSIS — S161XXA Strain of muscle, fascia and tendon at neck level, initial encounter: Secondary | ICD-10-CM

## 2023-08-25 DIAGNOSIS — I10 Essential (primary) hypertension: Secondary | ICD-10-CM | POA: Diagnosis not present

## 2023-08-25 DIAGNOSIS — F32A Depression, unspecified: Secondary | ICD-10-CM

## 2023-08-25 DIAGNOSIS — F419 Anxiety disorder, unspecified: Secondary | ICD-10-CM

## 2023-08-25 DIAGNOSIS — K219 Gastro-esophageal reflux disease without esophagitis: Secondary | ICD-10-CM

## 2023-08-25 MED ORDER — LISINOPRIL 20 MG PO TABS: 20.0000 mg | ORAL_TABLET | Freq: Every day | ORAL | 0 refills | Status: DC

## 2023-08-25 MED ORDER — BUPROPION HCL ER (XL) 300 MG PO TB24: 300.0000 mg | ORAL_TABLET | ORAL | 2 refills | Status: DC

## 2023-08-25 MED ORDER — TAMSULOSIN HCL 0.4 MG PO CAPS: 0.4000 mg | ORAL_CAPSULE | Freq: Every day | ORAL | 3 refills | Status: AC

## 2023-08-25 MED ORDER — ROSUVASTATIN CALCIUM 20 MG PO TABS: 20.0000 mg | ORAL_TABLET | Freq: Every day | ORAL | 11 refills | Status: DC

## 2023-08-25 NOTE — Patient Instructions (Addendum)
 Thank you, [name] for allowing us  to provide your care today. Today we discussed:  - Please call Urology for reschedule an appointment sooner!  - Please call your gastroenterologist to schedule an appointment for your underlying acid reflux. - I sent refills for lisinopril  and Wellbutrin  300 mg to your specialty pharmacy - Start Flomax one tablet daily  - For your neck, I attached couple of exercises that she can do to help with your muscle tightness and muscle strain.  I have ordered the following labs for you:  Lab Orders  No laboratory test(s) ordered today     Tests ordered today:  None  Referrals ordered today:   Referral Orders  No referral(s) requested today     I have ordered the following medication/changed the following medications:   Stop the following medications: Medications Discontinued During This Encounter  Medication Reason   buPROPion  (WELLBUTRIN  XL) 150 MG 24 hr tablet    mirtazapine  (REMERON ) 15 MG tablet    ibuprofen  (ADVIL ) 800 MG tablet    rosuvastatin  (CRESTOR ) 20 MG tablet Reorder   lisinopril  (ZESTRIL ) 20 MG tablet Reorder     Start the following medications: Meds ordered this encounter  Medications   buPROPion  (WELLBUTRIN  XL) 300 MG 24 hr tablet    Sig: Take 1 tablet (300 mg total) by mouth every morning.    Dispense:  30 tablet    Refill:  2   tamsulosin (FLOMAX) 0.4 MG CAPS capsule    Sig: Take 1 capsule (0.4 mg total) by mouth daily.    Dispense:  90 capsule    Refill:  3   lisinopril  (ZESTRIL ) 20 MG tablet    Sig: Take 1 tablet (20 mg total) by mouth daily.    Dispense:  30 tablet    Refill:  0   rosuvastatin  (CRESTOR ) 20 MG tablet    Sig: Take 1 tablet (20 mg total) by mouth daily. DC simvastatin     Dispense:  30 tablet    Refill:  11     Follow up: 2 months for HTN, PSA, GERD, Depression     Remember:   Should you have any questions or concerns please call the internal medicine clinic at [phone].      Albert Brown, D.O. [facility]

## 2023-08-25 NOTE — Progress Notes (Unsigned)
 Established Patient Office Visit  Subjective   Patient ID: Albert Brown, male    DOB: 05/01/1955  Age: 68 y.o. MRN: [id number]  Chief Complaint  Patient presents with   Medical Management of Chronic Issues    Has upcoming urologist appt to f/u PSA Also c/o fatigue (medication side-effect?)    HPI This is a 68 year old male with past medical history of hypertension, GERD, anxiety, depression, elevated PSA presents today for follow-up, after last office visit on 06/24/2023.  ROS   As per assessment and plan Objective:     BP (!) 163/87 (BP Location: Right Arm, Patient Position: Sitting, Cuff Size: Normal)   Pulse 69   Ht 5' 8 (1.727 m)   Wt 174 lb (78.9 kg)   SpO2 98%   BMI 26.46 kg/m  BP Readings from Last 3 Encounters:  08/25/23 (!) 163/87  06/24/23 (!) 159/93  10/15/22 (!) 151/98   Wt Readings from Last 3 Encounters:  08/25/23 174 lb (78.9 kg)  06/24/23 179 lb (81.2 kg)  10/15/22 184 lb (83.5 kg)   SpO2 Readings from Last 3 Encounters:  08/25/23 98%  06/24/23 98%  10/15/22 95%      Physical Exam  General: Sitting in chair, no acute distress Cardiovascular: RRR Pulmonary: Breathing comfortably, no wheezing or crackles Abdomen: Soft, nontender, nondistended MSK: Hypertonicity noted along the trapezius muscles left greater than right and left cervico- paraspinal muscles, range of motion intact of the left shoulder, 5 out of 5 strength bilateral upper extremities, sensation intact.   Assessment & Plan:  Patient is discussed with Dr Lovie   Problem List Items Addressed This Visit       Cardiovascular and Mediastinum   Essential hypertension   BP Readings from Last 3 Encounters:  08/25/23 (!) 163/87  06/24/23 (!) 159/93  10/15/22 (!) 151/98   Outpatient medication regimen includes lisinopril  20 mg daily, patient reports that he has not been taking this medication on a regular basis, states that he takes it intermittently.  Does report intermittent  blurry vision, no chest pain or shortness of breath.  No lower extremity edema. -Patient was advised strongly to continue daily lisinopril   - Patient was counseled on adverse effects of uncontrolled hypertension      Relevant Medications   lisinopril  (ZESTRIL ) 20 MG tablet   rosuvastatin  (CRESTOR ) 20 MG tablet     Digestive   GERD - Primary   Patient had EGD on 11/2019 that showed low-grade narrowing Schatzki ring, 2 cm hiatal hernia and gastritis. Patient takes Odefsey  (HIV) that has drug interaction with PPIs.  Per the last office visit, patient was advised to follow-up with GI.  Patient reports that he has not called [facility], he continues to have nausea daily and symptoms of acid reflux. -Advised to call  GI and schedule an appointment for possible EGD        Musculoskeletal and Integument   Cervical myofascial strain   Patient reports initially started about 3 months ago, reports soreness and muscle tightness along his posterior cervical/paraspinal muscles along the left trapezius muscles tracking from posterior to anterior deltoid.  Reports that sometimes he feels like bugs are crawling along his left shoulder, denies any weakness or numbness or tingling of his left forearm or hand.  Denies any trauma or falls.  Does report that his job requires repetitive motion, such as rotation side bending of his head.  On exam, patient has hypertonicity along the left trapezius muscle and bilateral cervical  paraspinal muscles.  Patient has 5 out of 5 strength of the bilateral upper extremities and sensation is intact.  No rotator cuff tendinopathy noted on exam. -Patient was advised on conservative treatment such as rest and ice.  -Given exercises targeted towards the cervical paraspinal muscles. -Advised to take Tylenol  needed for pain control -If symptoms do not improve in 1 month, neck step would be physical therapy        Other   Anxiety and depression   PHQ-9 score 9, GAD-7 score  9.  At the last office visit, patient was started on Wellbutrin  150 mg daily, reports that it helped with his symptoms a little, though still feels fatigue and has intermittent frustrations and anxiety. -Increase Wellbutrin  to 300 mg daily -Reassess in 1 month      Relevant Medications   buPROPion  (WELLBUTRIN  XL) 300 MG 24 hr tablet   Elevated PSA   PSA checked on 06/24/2023 with results of 8.4, increased compared to 2 years ago which was 6.9.  In the office, patient reports history of prostate cancer with father and older brother who was diagnosed in 49s.  Patient reports that he has an appointment with urologist next month, unable to recall which date.  Patient reports that symptoms of increase in urine urgency and straining; reports difficulty emptying bladder.  PVR in office 26 cc, waited about 20 cc.  IPSS score 30, severe.  - Patient is advised to call [facility] for earlier appointment in the setting of worsening of his symptoms and family history. -Advised to follow-up with [facility] as soon as possible for possible biopsy. - Start tamsulosin 0.4 mg daily       Return in about 2 months (around 10/25/2023) for HTN, PSA, GERD, Depression .    Toma Edwards, DO

## 2023-08-26 DIAGNOSIS — S161XXA Strain of muscle, fascia and tendon at neck level, initial encounter: Secondary | ICD-10-CM | POA: Insufficient documentation

## 2023-08-26 NOTE — Assessment & Plan Note (Signed)
 BP Readings from Last 3 Encounters:  08/25/23 (!) 163/87  06/24/23 (!) 159/93  10/15/22 (!) 151/98   Outpatient medication regimen includes lisinopril  20 mg daily, patient reports that he has not been taking this medication on a regular basis, states that he takes it intermittently.  Does report intermittent blurry vision, no chest pain or shortness of breath.  No lower extremity edema. -Patient was advised strongly to continue daily lisinopril   - Patient was counseled on adverse effects of uncontrolled hypertension

## 2023-08-26 NOTE — Assessment & Plan Note (Signed)
 PHQ-9 score 9, GAD-7 score 9.  At the last office visit, patient was started on Wellbutrin  150 mg daily, reports that it helped with his symptoms a little, though still feels fatigue and has intermittent frustrations and anxiety. -Increase Wellbutrin  to 300 mg daily -Reassess in 1 month

## 2023-08-26 NOTE — Progress Notes (Signed)
 Internal Medicine Clinic Attending  Case discussed with the resident at the time of the visit.  We reviewed the resident's history and exam and pertinent patient test results.  I agree with the assessment, diagnosis, and plan of care documented in the resident's note.    Several issues addressed today. Most urgent is that he follows up with Urology.

## 2023-08-26 NOTE — Assessment & Plan Note (Addendum)
 PSA checked on 06/24/2023 with results of 8.4, increased compared to 2 years ago which was 6.9.  In the office, patient reports history of prostate cancer with father and older brother who was diagnosed in 66s.  Patient reports that he has an appointment with urologist next month, unable to recall which date.  Patient reports that symptoms of increase in urine urgency and straining; reports difficulty emptying bladder.  PVR in office 26 cc, waited about 20 cc.  IPSS score 30, severe.  - Patient is advised to call [facility] for earlier appointment in the setting of worsening of his symptoms and family history. -Advised to follow-up with [facility] as soon as possible for possible biopsy. - Start tamsulosin 0.4 mg daily

## 2023-08-26 NOTE — Assessment & Plan Note (Signed)
 Patient reports initially started about 3 months ago, reports soreness and muscle tightness along his posterior cervical/paraspinal muscles along the left trapezius muscles tracking from posterior to anterior deltoid.  Reports that sometimes he feels like bugs are crawling along his left shoulder, denies any weakness or numbness or tingling of his left forearm or hand.  Denies any trauma or falls.  Does report that his job requires repetitive motion, such as rotation side bending of his head.  On exam, patient has hypertonicity along the left trapezius muscle and bilateral cervical paraspinal muscles.  Patient has 5 out of 5 strength of the bilateral upper extremities and sensation is intact.  No rotator cuff tendinopathy noted on exam. -Patient was advised on conservative treatment such as rest and ice.  -Given exercises targeted towards the cervical paraspinal muscles. -Advised to take Tylenol  needed for pain control -If symptoms do not improve in 1 month, neck step would be physical therapy

## 2023-08-26 NOTE — Assessment & Plan Note (Signed)
 Patient had EGD on 11/2019 that showed low-grade narrowing Schatzki ring, 2 cm hiatal hernia and gastritis. Patient takes Odefsey  (HIV) that has drug interaction with PPIs.  Per the last office visit, patient was advised to follow-up with GI.  Patient reports that he has not called [facility], he continues to have nausea daily and symptoms of acid reflux. -Advised to call [facility] and schedule an appointment for possible EGD

## 2023-09-24 ENCOUNTER — Other Ambulatory Visit: Payer: Self-pay | Admitting: Student

## 2023-09-24 DIAGNOSIS — I10 Essential (primary) hypertension: Secondary | ICD-10-CM

## 2023-09-24 NOTE — Telephone Encounter (Unsigned)
 Copied from CRM [id number]. Topic: Clinical - Medication Refill >> Sep 24, 2023 12:09 PM Shamecia H wrote: Medication: buPROPion  (WELLBUTRIN  XL) 150 MG 24 hr tablet [Pharmacy Med Name: BUPROPION  XL 150MG  TABLETS (24 H)]  Has the patient contacted their pharmacy? Yes (Agent: If no, request that the patient contact the pharmacy for the refill. If patient does not wish to contact the pharmacy document the reason why and proceed with request.) (Agent: If yes, when and what did the pharmacy advise?)  This is the patient's preferred pharmacy:  [facility]) [phone] - [place], [place] - [address] AT [facility] [address] [place] [address] Phone: [phone] Fax: [phone]  Is this the correct pharmacy for this prescription? Yes If no, delete pharmacy and type the correct one.   Has the prescription been filled recently? No  Is the patient out of the medication? No  Has the patient been seen for an appointment in the last year OR does the patient have an upcoming appointment? Yes  Can we respond through MyChart? Yes  Agent: Please be advised that Rx refills may take up to 3 business days. We ask that you follow-up with your pharmacy.

## 2023-10-02 ENCOUNTER — Ambulatory Visit: Payer: Self-pay

## 2023-10-02 ENCOUNTER — Other Ambulatory Visit: Payer: Self-pay | Admitting: Student

## 2023-10-02 NOTE — Telephone Encounter (Signed)
 FYI Only or Action Required?: FYI only for provider.  Patient was last seen in primary care on 08/25/2023 by Heddy Barren, DO.  Called Nurse Triage reporting Insect Bite.  Symptoms began several days ago.  Interventions attempted: Rest, hydration, or home remedies.  Symptoms are: gradually worsening.  Triage Disposition: See Physician Within 24 Hours  Patient/caregiver understands and will follow disposition?: No  Copied from CRM #[id number]. Topic: Clinical - Red Word Triage >> Oct 02, 2023 12:23 PM Adrianna P wrote: Red Word that prompted transfer to Nurse Triage: Got stung by bees about 3 weeks ago, has been having leg pain 7/10   ----------------------------------------------------------------------- From previous Reason for Contact - Scheduling: Patient/patient representative is calling to schedule an appointment. Refer to attachments for appointment information. Reason for Disposition  [1] Red or very tender (to touch) area AND [2] started over 24 hours after the bite  Answer Assessment - Initial Assessment Questions 1. TYPE of INSECT: What type of insect was it?      Multiple bee stings 2. ONSET: When did you get bitten?      legs 3. LOCATION: Where is the insect bite located?      leg 4. REDNESS: Is the area red or pink? If Yes, ask: What size is the area of redness? (inches or cm). When did the redness start?     yes 5. PAIN: Is there any pain? If Yes, ask: How bad is the pain? (Scale 0-10; or none, mild, moderate, severe)     7/10  6. ITCHING: Does it itch? If Yes, ask: How bad is the itch?      denies 7. SWELLING: How big is the swelling? (e.g., inches, cm, or compare to coins)     denies 8. OTHER SYMPTOMS: Do you have any other symptoms?  (e.g., difficulty breathing, fever, hives)     Denies  Offered appt tomorrow, pt declined, states that he has to work, States that he needs an appt after 2pm. Pt advised that he should be seen sooner,  pt understands but states he cannot.  Protocols used: Insect Bite-A-AH

## 2023-10-02 NOTE — Telephone Encounter (Signed)
 RTC to patient to see if he could come in sooner for an appointment since he continues to have the leg pain from the bee stings.  Message was left for patient to call for a sooner appointment.

## 2023-10-02 NOTE — Telephone Encounter (Signed)
 Medication discontinued 07/22/2023

## 2023-10-05 NOTE — Progress Notes (Deleted)
 Subjective:  Chief complaint: follow-up for HIV disease on medications   Patient ID: Albert Brown, male    DOB: 05-07-1955, 68 y.o.   MRN: [id number]  HPI  Past Medical History:  Diagnosis Date   ALLERGIC RHINITIS    Allergic sinusitis    Anxiety state, unspecified    CAD S/P percutaneous coronary angioplasty 08/08/2013   mRCA  Xience Alpine DES 2.75 mm x 33 mm (3.0 mm)   Cerumen impaction, right    DEPRESSION    Diarrhea    Erectile dysfunction    EXTERNAL HEMORRHOIDS WITHOUT MENTION COMP    GERD    Hiatal hernia    HIV DISEASE    HIV infection (HCC)    HYPOGONADISM    Internal hemorrhoids    Non-ST elevated myocardial infarction (non-STEMI) (HCC) 08/08/2013   SubAcute100% RCA -- PCI; Echo: EF 55-605, Normal LV Size & Function - ~mild HK of Inferior Myocardium with mildly reduced RV function.  Normal Valve function   Periodontal disease    PERIPHERAL NEUROPATHY    PHARYNGITIS, RECURRENT    Pterygium of left eye    Tubular adenoma of colon    Urinary frequency    Urticaria    VERTIGO     Past Surgical History:  Procedure Laterality Date   LEFT HEART CATHETERIZATION WITH CORONARY ANGIOGRAM N/A 08/08/2013   Procedure: LEFT HEART CATHETERIZATION WITH CORONARY ANGIOGRAM;  Surgeon: Alm LELON Clay, MD;  Location: [facility] CATH LAB;  Service: Cardiovascular;  Laterality: N/A;   PERCUTANEOUS CORONARY STENT INTERVENTION (PCI-S)  08/08/2013   mRCA 100% --> Xience Alpine DES 2.75 mm x 33 mm --> 3.0 mm    Family History  Problem Relation Age of Onset   Cancer Mother        brain tumor   Prostate cancer Father    Osteosarcoma Father    Alcohol abuse Father    Prostate cancer Brother    Diabetes Brother    Colon cancer Neg Hx    Esophageal cancer Neg Hx    Liver cancer Neg Hx    Rectal cancer Neg Hx    Stomach cancer Neg Hx       Social History   Socioeconomic History   Marital status: Single    Spouse name: Not on file   Number of children: Not on file   Years of  education: Not on file   Highest education level: Not on file  Occupational History   Not on file  Tobacco Use   Smoking status: Former    Current packs/day: 0.00    Types: Cigarettes    Quit date: 03/02/2007    Years since quitting: 16.6    Passive exposure: Never   Smokeless tobacco: Never   Tobacco comments:    pt. no longer smokes  Vaping Use   Vaping status: Never Used  Substance and Sexual Activity   Alcohol use: Yes    Alcohol/week: 8.0 standard drinks of alcohol    Types: 8 Cans of beer per week    Comment: sometimes.   Drug use: Not Currently    Types: Marijuana    Comment: daily    Sexual activity: Not Currently    Partners: Male    Comment: declined condoms  Other Topics Concern   Not on file  Social History Narrative   Not on file   Social Drivers of Health   Financial Resource Strain: Not on file  Food Insecurity: Not on file  Transportation Needs:  Not on file  Physical Activity: Not on file  Stress: Not on file  Social Connections: Not on file    Allergies  Allergen Reactions   Brilinta  [Ticagrelor ] Shortness Of Breath    BREATHING ISSUES   Codeine Anaphylaxis   Pepcid [Famotidine] Other (See Comments)    He is on Odefsey    Prilosec [Omeprazole ] Other (See Comments)    He is on Odefsey    Bactrim [Sulfamethoxazole-Trimethoprim]     Pt stated he died when taking this   Ciprofloxacin     REACTION: rash   Dapsone     REACTION: severe-anaphylaxis   Pravastatin  Other (See Comments)    Muscle cramps   Sulfamethoxazole-Trimethoprim     Anaphylaxis      Current Outpatient Medications:    albuterol  (VENTOLIN  HFA) 108 (90 Base) MCG/ACT inhaler, Inhale 2 puffs into the lungs every 6 (six) hours as needed for wheezing or shortness of breath., Disp: 18 g, Rfl: 0   aspirin  EC 81 MG tablet, Take 1 tablet (81 mg total) by mouth daily., Disp: 90 tablet, Rfl: 3   buPROPion  (WELLBUTRIN  XL) 300 MG 24 hr tablet, Take 1 tablet (300 mg total) by mouth every  morning., Disp: 30 tablet, Rfl: 2   cetirizine  (ZYRTEC  ALLERGY ) 10 MG tablet, Take 1 tablet (10 mg total) by mouth daily., Disp: 30 tablet, Rfl: 0   Coenzyme Q10 (COQ-10) 50 MG CAPS, Take 1 tablet by mouth daily., Disp: , Rfl:    emtricitabine-rilpivir-tenofovir  AF (ODEFSEY ) 200-25-25 MG TABS tablet, Take 1 tablet by mouth daily with breakfast., Disp: 30 tablet, Rfl: 11   fluticasone  (FLONASE ) 50 MCG/ACT nasal spray, Place 1 spray into both nostrils daily., Disp: 16 g, Rfl: 0   lisinopril  (ZESTRIL ) 20 MG tablet, TAKE 1 TABLET(20 MG) BY MOUTH DAILY, Disp: 30 tablet, Rfl: 0   Multiple Vitamin (MULTI VITAMIN MENS PO), Take 1 tablet by mouth daily., Disp: , Rfl:    NON FORMULARY, Has had aleve, alka seltzer, Disp: , Rfl:    nystatin  (MYCOSTATIN ) 100000 UNIT/ML suspension, Take 5 mLs (500,000 Units total) by mouth 4 (four) times daily., Disp: 60 mL, Rfl: 0   rosuvastatin  (CRESTOR ) 20 MG tablet, Take 1 tablet (20 mg total) by mouth daily. DC simvastatin , Disp: 30 tablet, Rfl: 11   tamsulosin  (FLOMAX ) 0.4 MG CAPS capsule, Take 1 capsule (0.4 mg total) by mouth daily., Disp: 90 capsule, Rfl: 3    Review of Systems     Objective:   Physical Exam        Assessment & Plan:

## 2023-10-06 ENCOUNTER — Other Ambulatory Visit: Payer: Self-pay

## 2023-10-06 ENCOUNTER — Ambulatory Visit: Payer: Self-pay | Admitting: Infectious Disease

## 2023-10-06 DIAGNOSIS — I2510 Atherosclerotic heart disease of native coronary artery without angina pectoris: Secondary | ICD-10-CM

## 2023-10-06 DIAGNOSIS — B20 Human immunodeficiency virus [HIV] disease: Secondary | ICD-10-CM

## 2023-10-06 DIAGNOSIS — J309 Allergic rhinitis, unspecified: Secondary | ICD-10-CM

## 2023-10-06 DIAGNOSIS — R972 Elevated prostate specific antigen [PSA]: Secondary | ICD-10-CM

## 2023-10-08 ENCOUNTER — Ambulatory Visit: Payer: Self-pay

## 2023-10-08 ENCOUNTER — Other Ambulatory Visit: Payer: Self-pay

## 2023-10-08 VITALS — BP 126/73 | HR 73 | Temp 98.0°F | Ht 68.0 in | Wt 170.6 lb

## 2023-10-08 DIAGNOSIS — F32A Depression, unspecified: Secondary | ICD-10-CM

## 2023-10-08 DIAGNOSIS — S161XXA Strain of muscle, fascia and tendon at neck level, initial encounter: Secondary | ICD-10-CM

## 2023-10-08 DIAGNOSIS — R972 Elevated prostate specific antigen [PSA]: Secondary | ICD-10-CM

## 2023-10-08 DIAGNOSIS — M7122 Synovial cyst of popliteal space [Baker], left knee: Secondary | ICD-10-CM

## 2023-10-08 DIAGNOSIS — F419 Anxiety disorder, unspecified: Secondary | ICD-10-CM

## 2023-10-08 DIAGNOSIS — K219 Gastro-esophageal reflux disease without esophagitis: Secondary | ICD-10-CM

## 2023-10-08 MED ORDER — LIDOCAINE HCL (PF) 1 % IJ SOLN: 5.0000 mL | Freq: Once | INTRAMUSCULAR | Status: DC

## 2023-10-08 MED ORDER — BUPROPION HCL ER (XL) 150 MG PO TB24: 150.0000 mg | ORAL_TABLET | ORAL | 2 refills | Status: DC

## 2023-10-08 MED ORDER — TRIAMCINOLONE ACETONIDE 40 MG/ML IJ SUSP
40.0000 mg | Freq: Once | INTRAMUSCULAR | Status: AC
Start: 2023-10-08 — End: 2023-10-08
  Administered 2023-10-08 (×2): 40 mg via INTRA_ARTICULAR

## 2023-10-08 MED ORDER — METHOCARBAMOL 500 MG PO TABS: 1000.0000 mg | ORAL_TABLET | Freq: Three times a day (TID) | ORAL | 1 refills | Status: AC | PRN

## 2023-10-08 MED ORDER — LIDOCAINE HCL (PF) 1 % IJ SOLN
2.0000 mL | Freq: Once | INTRAMUSCULAR | Status: AC
  Administered 2023-10-08 (×2): 2 mL

## 2023-10-08 NOTE — Assessment & Plan Note (Signed)
 Patient had EGD on 11/2019 that showed low-grade narrowing Schatzki ring, 2 cm hiatal hernia and gastritis. Patient takes Odefsey  (HIV) that has drug interaction with PPIs.  He has not yet called GI to schedule an EGD.  -Advised to call [facility] and schedule an appointment for possible EGD

## 2023-10-08 NOTE — Assessment & Plan Note (Signed)
 3 weeks ago the patient was mowing his neighbor's yard and was stung multiple times by bees on both of his legs and his back.  About 3 or so days after that he began having posterior knee pain which radiated up his left leg.  He does not recall any specific injury to his knee.  He states that his pain has about the same level since it started.  His pain is exacerbated by standing on his feet for long periods of time along with terminal flexion and extension.  Sometimes he has a sensation that his leg might give out on him.  He has not noticed any redness or swelling.  On physical exam, there are no signs of osteoarthritic, meniscal, or ligamental changes.  He does have a palpable cyst in the back of his leg which is tender to palpation.  He has no risk factors for DVT.  There is no erythema, or warmth to suggest that he might have a DVT.  While not normally symptomatic, I believe that his Baker's cyst may be causing his symptoms today.  Unclear what the cause of his Baker's cyst is.  It is possible that this cyst is a result of a sudden movement that occurred while he was running away from the bees.  Today we performed an aspiration and intra-articular injection.  The patient noted some improvement of his symptoms after the injection.   - Synovial fluid sent for cell count and crystals -Provided with intra-articular steroid injection today, please see procedure documentation below.  Instructed to watch out for signs of infection such as redness, warmth, swelling, or increased pain.

## 2023-10-08 NOTE — Assessment & Plan Note (Signed)
 Seen by Dr. Heddy on 6/30 for this issue. He has continued soreness and muscle tightness around his posterior cervical and trapezius region.  He does not have any associated weakness or numbness.  On physical exam he has muscle spasm of his trapezius.  Treatment up to this point has involved rest, icing, home exercise program, and Tylenol .  -Continue exercises targeted towards the cervical paraspinal muscles. - Prescribed Robaxin  1000 mg Q8 as needed for muscle spasms - Patient declined physical therapy today as he is unsure that he would be able to afford it.  If this problem continues, this would be neck step.

## 2023-10-08 NOTE — Assessment & Plan Note (Signed)
 He feels that the Wellbutrin  300 mg has caused him to be elated and he would like to reduce the dose back to 150mg   -Prescribed Wellbutrin  150mg 

## 2023-10-08 NOTE — Progress Notes (Signed)
 CC: Acute Concern of left knee pain  HPI:  Albert Brown is a 68 y.o. male with pertinent PMH of hypertension, GERD, anxiety, depression, elevated PSA who presents for follow-up of anxiety, depression, GERD, Elevated PSA, cervical  myofascial strain and new concern of posterior left knee pain.  Please see problem based assessment and plan for further history.  Review of Systems  Constitutional:  Negative for chills and fever.  Musculoskeletal:  Positive for back pain. Negative for myalgias.  Skin:  Negative for itching and rash.    Medications: Current Outpatient Medications  Medication Instructions   albuterol  (VENTOLIN  HFA) 108 (90 Base) MCG/ACT inhaler 2 puffs, Inhalation, Every 6 hours PRN   aspirin  EC 81 mg, Oral, Daily   buPROPion  (WELLBUTRIN  XL) 150 mg, Oral, BH-each morning   cetirizine  (ZYRTEC  ALLERGY ) 10 mg, Oral, Daily   Coenzyme Q10 (COQ-10) 50 MG CAPS 1 tablet, Oral, Daily   emtricitabine-rilpivir-tenofovir  AF (ODEFSEY ) 200-25-25 MG TABS tablet 1 tablet, Oral, Daily with breakfast   fluticasone  (FLONASE ) 50 MCG/ACT nasal spray 1 spray, Each Nare, Daily   lisinopril  (ZESTRIL ) 20 MG tablet TAKE 1 TABLET(20 MG) BY MOUTH DAILY   methocarbamol  (ROBAXIN ) 1,000 mg, Oral, Every 8 hours PRN   Multiple Vitamin (MULTI VITAMIN MENS PO) 1 tablet, Daily   NON FORMULARY Has had aleve, alka seltzer   nystatin  (MYCOSTATIN ) 500,000 Units, Oral, 4 times daily   rosuvastatin  (CRESTOR ) 20 mg, Oral, Daily, DC simvastatin    tamsulosin  (FLOMAX ) 0.4 mg, Oral, Daily     Physical Exam:  Vitals:   10/08/23 1433  BP: 126/73  Pulse: 73  Temp: 98 F (36.7 C)  TempSrc: Oral  SpO2: 100%  Weight: 170 lb 9.6 oz (77.4 kg)  Height: 5' 8 (1.727 m)    Physical Exam Neck:     Comments: Spasm of left trapezius muscle. Musculoskeletal:     Right knee: No erythema or ecchymosis. Normal range of motion. No tenderness.     Instability Tests: Anterior drawer test negative. Posterior drawer  test negative. Anterior Lachman test negative. Medial McMurray test negative and lateral McMurray test negative.     Left knee: No erythema or ecchymosis. Normal range of motion. Tenderness present.     Instability Tests: Anterior drawer test negative. Posterior drawer test negative. Anterior Lachman test negative. Medial McMurray test negative and lateral McMurray test negative.     Comments: Left knee with swelling and tenderness posteriorly.  Baker's cyst palpated.  No medial or lateral joint line tenderness.  Bee stings present on bilateral legs.       Assessment & Plan:   Assessment & Plan Synovial cyst of left popliteal space 3 weeks ago the patient was mowing his neighbor's yard and was stung multiple times by bees on both of his legs and his back.  About 3 or so days after that he began having posterior knee pain which radiated up his left leg.  He does not recall any specific injury to his knee.  He states that his pain has about the same level since it started.  His pain is exacerbated by standing on his feet for long periods of time along with terminal flexion and extension.  Sometimes he has a sensation that his leg might give out on him.  He has not noticed any redness or swelling.  On physical exam, there are no signs of osteoarthritic, meniscal, or ligamental changes.  He does have a palpable cyst in the back of his leg which  is tender to palpation.  He has no risk factors for DVT.  There is no erythema, or warmth to suggest that he might have a DVT.  While not normally symptomatic, I believe that his Baker's cyst may be causing his symptoms today.  Unclear what the cause of his Baker's cyst is.  It is possible that this cyst is a result of a sudden movement that occurred while he was running away from the bees.  Today we performed an aspiration and intra-articular injection.  The patient noted some improvement of his symptoms after the injection.   - Synovial fluid sent for cell count  and crystals -Provided with intra-articular steroid injection today, please see procedure documentation below.  Instructed to watch out for signs of infection such as redness, warmth, swelling, or increased pain. Cervical myofascial strain, initial encounter Seen by Dr. Heddy on 6/30 for this issue. He has continued soreness and muscle tightness around his posterior cervical and trapezius region.  He does not have any associated weakness or numbness.  On physical exam he has muscle spasm of his trapezius.  Treatment up to this point has involved rest, icing, home exercise program, and Tylenol .  -Continue exercises targeted towards the cervical paraspinal muscles. - Prescribed Robaxin  1000 mg Q8 as needed for muscle spasms - Patient declined physical therapy today as he is unsure that he would be able to afford it.  If this problem continues, this would be neck step. Anxiety and depression He feels that the Wellbutrin  300 mg has caused him to be elated and he would like to reduce the dose back to 150mg   -Prescribed Wellbutrin  150mg  Elevated PSA PSA checked on 06/24/2023 with results of 8.4, increased compared to 2 years ago which was 6.9. Family history of prostate cancer with father and older brother.  Started on tamsulosin  0.4 mg daily at previous appointment.  He is tolerating this medicine well with some improvement of his urination symptoms.  Was seen by [facility] and is scheduled for biopsy for October.  -Prostate biopsy in October - Continue Tamsulosin  0.4mg  Gastroesophageal reflux disease, unspecified whether esophagitis present Patient had EGD on 11/2019 that showed low-grade narrowing Schatzki ring, 2 cm hiatal hernia and gastritis. Patient takes Odefsey  (HIV) that has drug interaction with PPIs.  He has not yet called GI to schedule an EGD.  -Advised to call  GI and schedule an appointment for possible EGD  Orders Placed This Encounter  Procedures   Synovial cell  count + diff, w/ crystals   Synovial fluid, cell count    Joint Injection/Arthrocentesis  Date/Time: 10/08/2023 4:55 PM  Performed by: Napoleon Limes, MD Authorized by: Rosan Dayton BROCKS, DO  Indications: pain and joint swelling  Body area: knee Joint: left knee Local anesthesia used: yes Anesthesia: see MAR for details  Anesthesia: Local anesthesia used: yes Local Anesthetic: lidocaine  1% without epinephrine  Sedation: Patient sedated: no  Preparation: Patient was prepped and draped in the usual sterile fashion. Needle size: 18 G Ultrasound guidance: no Approach: superior (superolateral) Aspirate: serous and yellow Aspirate amount: 7 mL Triamcinolone  amount: 40 mg Lidocaine  1% amount: 2 mL Patient tolerance: patient tolerated the procedure well with no immediate complications     Patient seen with Dr. Dayton Rosan Limes Napoleon, MD [facility] Internal Medicine Resident PGY-1 Clinic Phone: [phone] Please contact the on call pager at [phone] for any urgent or emergent needs.

## 2023-10-08 NOTE — Assessment & Plan Note (Signed)
 PSA checked on 06/24/2023 with results of 8.4, increased compared to 2 years ago which was 6.9. Family history of prostate cancer with father and older brother.  Started on tamsulosin  0.4 mg daily at previous appointment.  He is tolerating this medicine well with some improvement of his urination symptoms.  Was seen by [facility] and is scheduled for biopsy for October.  -Prostate biopsy in October - Continue Tamsulosin  0.4mg 

## 2023-10-08 NOTE — Patient Instructions (Signed)
 Thank you, Albert Brown, for allowing us  to provide your care today. Today we discussed . . .  > Bakers Cyst       - We think that your symptoms are likely due to a collection of fluid in the back of your knee. We gave you a joint injection today. You should notice your symptoms start to improve. If your symptoms persist for 3 weeks call us  and we can get an MRI > Depression       - We decreased your wellbutrin  to 150mg  > Neck pain       - Start taking the Robaxin  at night   I have ordered the following labs for you:  Lab Orders  No laboratory test(s) ordered today      Referrals ordered today:   Referral Orders  No referral(s) requested today      Follow up: 1 month    Remember:  Should you have any questions or concerns please call the internal medicine clinic at [phone].     Melvenia Morrison, [facility]

## 2023-10-09 LAB — SYNOVIAL FLUID, CELL COUNT: Nuc cell # Fld: 84 {cells}/uL (ref 0–200)

## 2023-10-10 NOTE — Progress Notes (Signed)
 Internal Medicine Clinic Attending  I was physically present during the key portions of the resident provided service and participated in the medical decision making of patient's management care. I reviewed pertinent patient test results.  The assessment, diagnosis, and plan were formulated together and I agree with the documentation in the resident's note.  I was present for the entire procedure.  Gust Rung, DO

## 2023-10-13 ENCOUNTER — Ambulatory Visit: Admitting: Cardiology

## 2023-10-14 ENCOUNTER — Ambulatory Visit: Payer: Self-pay

## 2023-10-24 ENCOUNTER — Other Ambulatory Visit: Payer: Self-pay | Admitting: Student

## 2023-10-24 DIAGNOSIS — I10 Essential (primary) hypertension: Secondary | ICD-10-CM

## 2023-10-24 NOTE — Telephone Encounter (Signed)
 Next Appt With Internal Medicine Cathe King, DO) 11/06/2023 at 2:45 PM

## 2023-10-31 ENCOUNTER — Other Ambulatory Visit: Payer: Self-pay | Admitting: Infectious Disease

## 2023-10-31 DIAGNOSIS — B20 Human immunodeficiency virus [HIV] disease: Secondary | ICD-10-CM

## 2023-11-03 ENCOUNTER — Ambulatory Visit: Payer: Self-pay | Admitting: Infectious Disease

## 2023-11-06 ENCOUNTER — Encounter

## 2023-11-10 ENCOUNTER — Encounter: Admitting: Student

## 2023-11-11 ENCOUNTER — Other Ambulatory Visit: Payer: Self-pay

## 2023-11-11 DIAGNOSIS — Z113 Encounter for screening for infections with a predominantly sexual mode of transmission: Secondary | ICD-10-CM

## 2023-11-11 DIAGNOSIS — B20 Human immunodeficiency virus [HIV] disease: Secondary | ICD-10-CM

## 2023-11-19 ENCOUNTER — Other Ambulatory Visit: Payer: Self-pay

## 2023-11-19 ENCOUNTER — Other Ambulatory Visit (HOSPITAL_COMMUNITY)
Admission: RE | Admit: 2023-11-19 | Discharge: 2023-11-19 | Disposition: A | Discharge status: Home / Self Care | Source: Ambulatory Visit | Attending: Infectious Disease | Admitting: Infectious Disease

## 2023-11-19 ENCOUNTER — Other Ambulatory Visit

## 2023-11-19 DIAGNOSIS — B20 Human immunodeficiency virus [HIV] disease: Secondary | ICD-10-CM | POA: Diagnosis present

## 2023-11-19 DIAGNOSIS — Z113 Encounter for screening for infections with a predominantly sexual mode of transmission: Secondary | ICD-10-CM | POA: Insufficient documentation

## 2023-11-20 LAB — URINE CYTOLOGY ANCILLARY ONLY
Chlamydia: NEGATIVE
Comment: NEGATIVE
Comment: NORMAL
Neisseria Gonorrhea: NEGATIVE

## 2023-11-20 LAB — T-HELPER CELL (CD4) - (RCID CLINIC ONLY)
CD4 % Helper T Cell: 35 % (ref 33–65)
CD4 T Cell Abs: 647 /uL (ref 400–1790)

## 2023-11-22 LAB — COMPLETE METABOLIC PANEL WITHOUT GFR
AG Ratio: 2 (calc) (ref 1.0–2.5)
ALT: 25 U/L (ref 9–46)
AST: 20 U/L (ref 10–35)
Albumin: 4.7 g/dL (ref 3.6–5.1)
Alkaline phosphatase (APISO): 95 U/L (ref 35–144)
BUN: 17 mg/dL (ref 7–25)
CO2: 28 mmol/L (ref 20–32)
Calcium: 10.2 mg/dL (ref 8.6–10.3)
Chloride: 105 mmol/L (ref 98–110)
Creat: 1.06 mg/dL (ref 0.70–1.35)
Globulin: 2.3 g/dL (ref 1.9–3.7)
Glucose, Bld: 87 mg/dL (ref 65–99)
Potassium: 5.3 mmol/L (ref 3.5–5.3)
Sodium: 142 mmol/L (ref 135–146)
Total Bilirubin: 0.4 mg/dL (ref 0.2–1.2)
Total Protein: 7 g/dL (ref 6.1–8.1)

## 2023-11-22 LAB — CBC WITH DIFFERENTIAL/PLATELET
Absolute Lymphocytes: 1876 {cells}/uL (ref 850–3900)
Absolute Monocytes: 628 {cells}/uL (ref 200–950)
Basophils Absolute: 0 {cells}/uL (ref 0–200)
Basophils Relative: 0 %
Eosinophils Absolute: 234 {cells}/uL (ref 15–500)
Eosinophils Relative: 3.2 %
HCT: 46.5 % (ref 38.5–50.0)
Hemoglobin: 15.6 g/dL (ref 13.2–17.1)
MCH: 32.6 pg (ref 27.0–33.0)
MCHC: 33.5 g/dL (ref 32.0–36.0)
MCV: 97.1 fL (ref 80.0–100.0)
MPV: 10.6 fL (ref 7.5–12.5)
Monocytes Relative: 8.6 %
Neutro Abs: 4563 {cells}/uL (ref 1500–7800)
Neutrophils Relative %: 62.5 %
Platelets: 201 Thousand/uL (ref 140–400)
RBC: 4.79 Million/uL (ref 4.20–5.80)
RDW: 12 % (ref 11.0–15.0)
Total Lymphocyte: 25.7 %
WBC: 7.3 Thousand/uL (ref 3.8–10.8)

## 2023-11-22 LAB — LIPID PANEL
Cholesterol: 160 mg/dL (ref <200)
HDL: 49 mg/dL (ref >=40)
LDL Cholesterol (Calc): 80 mg/dL
Non-HDL Cholesterol (Calc): 111 mg/dL (ref <130)
Total CHOL/HDL Ratio: 3.3 (calc) (ref <5.0)
Triglycerides: 225 mg/dL — ABNORMAL HIGH (ref <150)

## 2023-11-22 LAB — HIV-1 RNA QUANT-NO REFLEX-BLD
HIV 1 RNA Quant: NOT DETECTED {copies}/mL
HIV-1 RNA Quant, Log: NOT DETECTED {Log_copies}/mL

## 2023-11-22 LAB — RPR: RPR Ser Ql: NONREACTIVE

## 2023-11-23 ENCOUNTER — Other Ambulatory Visit: Payer: Self-pay | Admitting: Student

## 2023-11-23 DIAGNOSIS — I10 Essential (primary) hypertension: Secondary | ICD-10-CM

## 2023-11-24 NOTE — Telephone Encounter (Signed)
 Medication sent to pharmacy

## 2023-11-28 ENCOUNTER — Other Ambulatory Visit: Payer: Self-pay | Admitting: Infectious Disease

## 2023-11-28 DIAGNOSIS — B20 Human immunodeficiency virus [HIV] disease: Secondary | ICD-10-CM

## 2023-12-01 NOTE — Telephone Encounter (Signed)
 Appt 10/8

## 2023-12-02 NOTE — Progress Notes (Unsigned)
 Subjective:  Chief complaint: follow-up for HIV disease on medications   Patient ID: Albert Brown, male    DOB: 02-26-1955, 68 y.o.   MRN: [id number]  HPI  Past Medical History:  Diagnosis Date   ALLERGIC RHINITIS    Allergic sinusitis    Anxiety state, unspecified    CAD S/P percutaneous coronary angioplasty 08/08/2013   mRCA  Xience Alpine DES 2.75 mm x 33 mm (3.0 mm)   Cerumen impaction, right    DEPRESSION    Diarrhea    Erectile dysfunction    EXTERNAL HEMORRHOIDS WITHOUT MENTION COMP    GERD    Hiatal hernia    HIV DISEASE    HIV infection (HCC)    HYPOGONADISM    Internal hemorrhoids    Non-ST elevated myocardial infarction (non-STEMI) (HCC) 08/08/2013   SubAcute100% RCA -- PCI; Echo: EF 55-605, Normal LV Size & Function - ~mild HK of Inferior Myocardium with mildly reduced RV function.  Normal Valve function   Periodontal disease    PERIPHERAL NEUROPATHY    PHARYNGITIS, RECURRENT    Pterygium of left eye    Tubular adenoma of colon    Urinary frequency    Urticaria    VERTIGO     Past Surgical History:  Procedure Laterality Date   LEFT HEART CATHETERIZATION WITH CORONARY ANGIOGRAM N/A 08/08/2013   Procedure: LEFT HEART CATHETERIZATION WITH CORONARY ANGIOGRAM;  Surgeon: Alm LELON Clay, MD;  Location: [facility] CATH LAB;  Service: Cardiovascular;  Laterality: N/A;   PERCUTANEOUS CORONARY STENT INTERVENTION (PCI-S)  08/08/2013   mRCA 100% --> Xience Alpine DES 2.75 mm x 33 mm --> 3.0 mm    Family History  Problem Relation Age of Onset   Cancer Mother        brain tumor   Prostate cancer Father    Osteosarcoma Father    Alcohol abuse Father    Prostate cancer Brother    Diabetes Brother    Colon cancer Neg Hx    Esophageal cancer Neg Hx    Liver cancer Neg Hx    Rectal cancer Neg Hx    Stomach cancer Neg Hx       Social History   Socioeconomic History   Marital status: Single    Spouse name: Not on file   Number of children: Not on file   Years of  education: Not on file   Highest education level: Not on file  Occupational History   Not on file  Tobacco Use   Smoking status: Former    Current packs/day: 0.00    Types: Cigarettes    Quit date: 03/02/2007    Years since quitting: 16.7    Passive exposure: Never   Smokeless tobacco: Never   Tobacco comments:    pt. no longer smokes  Vaping Use   Vaping status: Never Used  Substance and Sexual Activity   Alcohol use: Yes    Alcohol/week: 8.0 standard drinks of alcohol    Types: 8 Cans of beer per week    Comment: sometimes.   Drug use: Not Currently    Types: Marijuana    Comment: daily    Sexual activity: Not Currently    Partners: Male    Comment: declined condoms  Other Topics Concern   Not on file  Social History Narrative   Not on file   Social Drivers of Health   Financial Resource Strain: Not on file  Food Insecurity: Not on file  Transportation Needs:  Not on file  Physical Activity: Not on file  Stress: Not on file  Social Connections: Not on file    Allergies  Allergen Reactions   Brilinta  [Ticagrelor ] Shortness Of Breath    BREATHING ISSUES   Codeine Anaphylaxis   Pepcid [Famotidine] Other (See Comments)    He is on Odefsey    Prilosec [Omeprazole ] Other (See Comments)    He is on Odefsey    Bactrim [Sulfamethoxazole-Trimethoprim]     Pt stated he died when taking this   Ciprofloxacin     REACTION: rash   Dapsone     REACTION: severe-anaphylaxis   Pravastatin  Other (See Comments)    Muscle cramps   Sulfamethoxazole-Trimethoprim     Anaphylaxis      Current Outpatient Medications:    albuterol  (VENTOLIN  HFA) 108 (90 Base) MCG/ACT inhaler, Inhale 2 puffs into the lungs every 6 (six) hours as needed for wheezing or shortness of breath., Disp: 18 g, Rfl: 0   aspirin  EC 81 MG tablet, Take 1 tablet (81 mg total) by mouth daily., Disp: 90 tablet, Rfl: 3   buPROPion  (WELLBUTRIN  XL) 150 MG 24 hr tablet, Take 1 tablet (150 mg total) by mouth every  morning., Disp: 30 tablet, Rfl: 2   cetirizine  (ZYRTEC  ALLERGY ) 10 MG tablet, Take 1 tablet (10 mg total) by mouth daily., Disp: 30 tablet, Rfl: 0   Coenzyme Q10 (COQ-10) 50 MG CAPS, Take 1 tablet by mouth daily., Disp: , Rfl:    fluticasone  (FLONASE ) 50 MCG/ACT nasal spray, Place 1 spray into both nostrils daily., Disp: 16 g, Rfl: 0   lisinopril  (ZESTRIL ) 20 MG tablet, TAKE 1 TABLET(20 MG) BY MOUTH DAILY, Disp: 90 tablet, Rfl: 1   Multiple Vitamin (MULTI VITAMIN MENS PO), Take 1 tablet by mouth daily., Disp: , Rfl:    NON FORMULARY, Has had aleve, alka seltzer, Disp: , Rfl:    nystatin  (MYCOSTATIN ) 100000 UNIT/ML suspension, Take 5 mLs (500,000 Units total) by mouth 4 (four) times daily., Disp: 60 mL, Rfl: 0   ODEFSEY  200-25-25 MG TABS tablet, TAKE 1 TABLET BY MOUTH DAILY WITH BREAKFAST, Disp: 30 tablet, Rfl: 0   rosuvastatin  (CRESTOR ) 20 MG tablet, Take 1 tablet (20 mg total) by mouth daily. DC simvastatin , Disp: 30 tablet, Rfl: 11   tamsulosin  (FLOMAX ) 0.4 MG CAPS capsule, Take 1 capsule (0.4 mg total) by mouth daily., Disp: 90 capsule, Rfl: 3   Review of Systems     Objective:   Physical Exam        Assessment & Plan:

## 2023-12-03 ENCOUNTER — Other Ambulatory Visit: Payer: Self-pay

## 2023-12-03 ENCOUNTER — Encounter: Payer: Self-pay | Admitting: Infectious Disease

## 2023-12-03 ENCOUNTER — Ambulatory Visit: Payer: Self-pay | Admitting: Infectious Disease

## 2023-12-03 VITALS — BP 110/81 | HR 69 | Temp 98.0°F | Ht 68.0 in | Wt 170.0 lb

## 2023-12-03 DIAGNOSIS — B20 Human immunodeficiency virus [HIV] disease: Secondary | ICD-10-CM | POA: Diagnosis not present

## 2023-12-03 DIAGNOSIS — I10 Essential (primary) hypertension: Secondary | ICD-10-CM | POA: Diagnosis not present

## 2023-12-03 DIAGNOSIS — I2510 Atherosclerotic heart disease of native coronary artery without angina pectoris: Secondary | ICD-10-CM

## 2023-12-03 DIAGNOSIS — F32A Depression, unspecified: Secondary | ICD-10-CM

## 2023-12-03 DIAGNOSIS — N401 Enlarged prostate with lower urinary tract symptoms: Secondary | ICD-10-CM

## 2023-12-03 DIAGNOSIS — K219 Gastro-esophageal reflux disease without esophagitis: Secondary | ICD-10-CM

## 2023-12-03 DIAGNOSIS — E785 Hyperlipidemia, unspecified: Secondary | ICD-10-CM | POA: Diagnosis not present

## 2023-12-03 DIAGNOSIS — Z23 Encounter for immunization: Secondary | ICD-10-CM

## 2023-12-03 DIAGNOSIS — Z79899 Other long term (current) drug therapy: Secondary | ICD-10-CM | POA: Diagnosis not present

## 2023-12-03 MED ORDER — ODEFSEY 200-25-25 MG PO TABS: 1.0000 | ORAL_TABLET | Freq: Every day | ORAL | 11 refills | Status: AC

## 2023-12-03 NOTE — Telephone Encounter (Signed)
 Received voicemail from Wayne with [employer] in [place] [phone] requesting follow up on Rx refill. Called back, notified Abby that patient has appointment this afternoon and refill will be processed then.   Issis Lindseth, BSN, RN

## 2023-12-09 ENCOUNTER — Other Ambulatory Visit: Payer: Self-pay | Admitting: Orthopedic Surgery

## 2023-12-09 DIAGNOSIS — M79672 Pain in left foot: Secondary | ICD-10-CM

## 2023-12-09 DIAGNOSIS — M7742 Metatarsalgia, left foot: Secondary | ICD-10-CM

## 2023-12-23 ENCOUNTER — Ambulatory Visit
Admission: RE | Admit: 2023-12-23 | Discharge: 2023-12-23 | Disposition: A | Discharge status: Home / Self Care | Payer: Self-pay | Source: Ambulatory Visit | Attending: Orthopedic Surgery | Admitting: Orthopedic Surgery

## 2023-12-23 DIAGNOSIS — M7742 Metatarsalgia, left foot: Secondary | ICD-10-CM

## 2023-12-23 DIAGNOSIS — M79672 Pain in left foot: Secondary | ICD-10-CM

## 2023-12-24 ENCOUNTER — Ambulatory Visit: Admitting: Cardiology

## 2023-12-25 ENCOUNTER — Other Ambulatory Visit (HOSPITAL_COMMUNITY): Payer: Self-pay | Admitting: Urology

## 2023-12-25 DIAGNOSIS — C61 Malignant neoplasm of prostate: Secondary | ICD-10-CM

## 2024-01-07 ENCOUNTER — Encounter (HOSPITAL_COMMUNITY)
Admission: RE | Admit: 2024-01-07 | Discharge: 2024-01-07 | Disposition: A | Discharge status: Home / Self Care | Source: Ambulatory Visit | Attending: Urology | Admitting: Urology

## 2024-01-07 ENCOUNTER — Ambulatory Visit (HOSPITAL_COMMUNITY)
Admission: RE | Admit: 2024-01-07 | Discharge: 2024-01-07 | Disposition: A | Discharge status: Home / Self Care | Source: Ambulatory Visit | Attending: Urology | Admitting: Urology

## 2024-01-07 DIAGNOSIS — C61 Malignant neoplasm of prostate: Secondary | ICD-10-CM | POA: Diagnosis present

## 2024-01-07 LAB — POCT I-STAT CREATININE: Creatinine, Ser: 1.1 mg/dL (ref 0.61–1.24)

## 2024-01-07 MED ORDER — TECHNETIUM TC 99M MEDRONATE IV KIT
20.0000 | PACK | Freq: Once | INTRAVENOUS | Status: AC | PRN
  Administered 2024-01-07: 20.5 via INTRAVENOUS

## 2024-01-07 MED ORDER — IOHEXOL 300 MG/ML  SOLN
100.0000 mL | Freq: Once | INTRAMUSCULAR | Status: AC | PRN
  Administered 2024-01-07: 100 mL via INTRAVENOUS

## 2024-01-27 ENCOUNTER — Ambulatory Visit: Admitting: Cardiology

## 2024-02-03 ENCOUNTER — Other Ambulatory Visit: Payer: Self-pay

## 2024-02-03 DIAGNOSIS — F419 Anxiety disorder, unspecified: Secondary | ICD-10-CM

## 2024-02-03 MED ORDER — BUPROPION HCL ER (XL) 150 MG PO TB24: 150.0000 mg | ORAL_TABLET | ORAL | 2 refills | Status: AC

## 2024-02-08 NOTE — Progress Notes (Unsigned)
 Cardiology Office Note:    Date:  02/09/2024   ID:  Albert Brown, DOB [birth year], MRN [id number]  PCP:  Albert Homans, MD   [facility] Providers Cardiologist:  Albert Parchment, MD     Referring MD: Albert Homans, MD   Chief Complaint  Patient presents with   Coronary Artery Disease    History of Present Illness:    Albert Brown is a 68 y.o. male who is self referred for evaluation of CAD. He is s/p inferior STEMI in 2015 and underwent DES of the mid RCA with 2.75 x 33 mm Xience Alpine stent. Last seen by Dr Brown in April 2022. History of HTN, HLD and HIV. Myoview  in May 2022 showed a small fixed inferior defect and was low risk. EF 60%.  On follow up today he is doing ok from a CV standpoint. Walks a lot at work but not doing much aerobic activity. Denies any significant chest pain or dyspnea currently. Has some issues with depression. Also diagnosed with prostate CA 2 months ago and hasn't decided yet what he is going to do. He is intolerant of Crestor  due to fatigue. Had myalgias in the past on pravastatin . BP has been well controlled.   Past Medical History:  Diagnosis Date   ALLERGIC RHINITIS    Allergic sinusitis    Anxiety state, unspecified    CAD S/P percutaneous coronary angioplasty 08/08/2013   mRCA  Xience Alpine DES 2.75 mm x 33 mm (3.0 mm)   Cerumen impaction, right    DEPRESSION    Diarrhea    Erectile dysfunction    EXTERNAL HEMORRHOIDS WITHOUT MENTION COMP    GERD    Hiatal hernia    HIV DISEASE    HIV infection (HCC)    HYPOGONADISM    Internal hemorrhoids    Non-ST elevated myocardial infarction (non-STEMI) (HCC) 08/08/2013   SubAcute100% RCA -- PCI; Echo: EF 55-605, Normal LV Size & Function - ~mild HK of Inferior Myocardium with mildly reduced RV function.  Normal Valve function   Periodontal disease    PERIPHERAL NEUROPATHY    PHARYNGITIS, RECURRENT    Pterygium of left eye    Tubular adenoma of colon    Urinary frequency     Urticaria    VERTIGO     Past Surgical History:  Procedure Laterality Date   LEFT HEART CATHETERIZATION WITH CORONARY ANGIOGRAM N/A 08/08/2013   Procedure: LEFT HEART CATHETERIZATION WITH CORONARY ANGIOGRAM;  Surgeon: Albert LELON Clay, MD;  Location[facility] CATH LAB;  Service: Cardiovascular;  Laterality: N/A;   PERCUTANEOUS CORONARY STENT INTERVENTION (PCI-S)  08/08/2013   mRCA 100% --> Xience Alpine DES 2.75 mm x 33 mm --> 3.0 mm    Current Medications: Active Medications[1]   Allergies:   Brilinta  [ticagrelor ], Codeine, Pepcid [famotidine], Prilosec [omeprazole ], Bactrim [sulfamethoxazole-trimethoprim], Ciprofloxacin, Dapsone, Pravastatin , and Sulfamethoxazole-trimethoprim   Social History   Socioeconomic History   Marital status: Single    Spouse name: Not on file   Number of children: Not on file   Years of education: Not on file   Highest education level: Not on file  Occupational History   Not on file  Tobacco Use   Smoking status: Former    Current packs/day: 0.00    Types: Cigarettes    Quit date: 03/02/2007    Years since quitting: 16.9    Passive exposure: Never   Smokeless tobacco: Never   Tobacco comments:    pt. no longer smokes  Vaping Use   Vaping status: Never Used  Substance and Sexual Activity   Alcohol use: Yes    Alcohol/week: 8.0 standard drinks of alcohol    Types: 8 Cans of beer per week    Comment: sometimes.   Drug use: Not Currently    Types: Marijuana    Comment: daily    Sexual activity: Not Currently    Partners: Male    Comment: declined condoms  Other Topics Concern   Not on file  Social History Narrative   Not on file   Social Drivers of Health   Tobacco Use: Medium Risk (02/09/2024)   Patient History    Smoking Tobacco Use: Former    Smokeless Tobacco Use: Never    Passive Exposure: Never  Programmer, Applications: Not on file  Food Insecurity: Not on file  Transportation Needs: Not on file  Physical Activity: Not on file   Stress: Not on file  Social Connections: Not on file  Depression (PHQ2-9): High Risk (12/03/2023)   Depression (PHQ2-9)    PHQ-2 Score: 14  Alcohol Screen: Not on file  Housing: Not on file  Utilities: Not on file  Health Literacy: Not on file     Family History: The patient's family history includes Alcohol abuse in his father; Cancer in his mother; Diabetes in his brother; Osteosarcoma in his father; Prostate cancer in his brother and father. There is no history of Colon cancer, Esophageal cancer, Liver cancer, Rectal cancer, or Stomach cancer.  ROS:   Please see the history of present illness.     All other systems reviewed and are negative.  EKGs/Labs/Other Studies Reviewed:    The following studies were reviewed today: EKG Interpretation Date/Time:  Monday February 09 2024 15:12:52 EST Ventricular Rate:  85 PR Interval:  122 QRS Duration:  90 QT Interval:  354 QTC Calculation: 421 R Axis:   53  Text Interpretation: Normal sinus rhythm with sinus arrhythmia Normal ECG When compared with ECG of 09-Aug-2013 07:31, Minimal criteria for Inferior infarct are no longer Present T wave inversion less evident in Inferior leads Confirmed by Albert Brown [phone]) on 02/09/2024 3:48:27 PM   EKG Interpretation Date/Time:  Monday February 09 2024 15:12:52 EST Ventricular Rate:  85 PR Interval:  122 QRS Duration:  90 QT Interval:  354 QTC Calculation: 421 R Axis:   53  Text Interpretation: Normal sinus rhythm with sinus arrhythmia Normal ECG When compared with ECG of 09-Aug-2013 07:31, Minimal criteria for Inferior infarct are no longer Present T wave inversion less evident in Inferior leads Confirmed by Albert Brown [id number]) on 02/09/2024 3:48:27 PM    Recent Labs: 11/19/2023: ALT 25; BUN 17; Hemoglobin 15.6; Platelets 201; Potassium 5.3; Sodium 142 01/07/2024: Creatinine, Ser 1.10  Recent Lipid Panel    Component Value Date/Time   CHOL 160 11/19/2023 1416   CHOL 126  04/01/2019 1134   TRIG 225 (H) 11/19/2023 1416   HDL 49 11/19/2023 1416   HDL 44 04/01/2019 1134   CHOLHDL 3.3 11/19/2023 1416   VLDL 29 05/09/2016 1700   LDLCALC 80 11/19/2023 1416     Risk Assessment/Calculations:                Physical Exam:    VS:  BP 124/78 (Cuff Size: Large)   Pulse 85   Ht 5' 8 (1.727 m)   Wt 174 lb 14.4 oz (79.3 kg)   SpO2 97%   BMI 26.59 kg/m     Wt Readings  from Last 3 Encounters:  02/09/24 174 lb 14.4 oz (79.3 kg)  12/03/23 170 lb (77.1 kg)  10/08/23 170 lb 9.6 oz (77.4 kg)     GEN:  Well nourished, well developed in no acute distress HEENT: Normal NECK: No JVD; No carotid bruits LYMPHATICS: No lymphadenopathy CARDIAC: RRR, no murmurs, rubs, gallops RESPIRATORY:  Clear to auscultation without rales, wheezing or rhonchi  ABDOMEN: Soft, non-tender, non-distended MUSCULOSKELETAL:  No edema; No deformity  SKIN: Warm and dry NEUROLOGIC:  Alert and oriented x 3 PSYCHIATRIC:  Normal affect   ASSESSMENT:    1. Essential hypertension   2. Coronary artery disease of native artery of native heart with stable angina pectoris   3. Hypercholesterolemia    PLAN:    In order of problems listed above:  CAD s/p DES of mid RCA in setting of STEMI in 2015. Last Myoview  in 2022 was normal. No significant angina. Continue ASA and lisinopril . Heart healthy diet. Increase aerobic activity HLD. Intolerant to 2 statins. Will recheck lipid levels and refer to Pharm D lipid clinic. Consider Repatha. Will need to check for interaction with HIV therapy.  HTN continue lisinopril .            Medication Adjustments/Labs and Tests Ordered: Current medicines are reviewed at length with the patient today.  Concerns regarding medicines are outlined above.  Orders Placed This Encounter  Procedures   Basic metabolic panel with GFR   Lipid panel   Hepatic function panel   AMB Referral to [facility] Pharm-[facility]   EKG 12-Lead   No orders of the defined types  were placed in this encounter.   Patient Instructions  Medication Instructions:  Continue same medications *If you need a refill on your cardiac medications before your next appointment, please call your pharmacy*  Lab Work: Have fasting bmet,lipid and hepatic panels   Testing/Procedures: None ordered  Follow-Up: At [facility], you and your health needs are our priority.  As part of our continuing mission to provide you with exceptional heart care, our providers are all part of one team.  This team includes your primary Cardiologist (physician) and Advanced Practice Providers or APPs (Physician Assistants and Nurse Practitioners) who all work together to provide you with the care you need, when you need it.  Your next appointment:  1 year   Call in August to schedule Dec appointment     Provider:  Dr.Deborh Pense          Scheduled appointment with Pharmacist in Lipid Clinic    We recommend signing up for the patient portal called MyChart.  Sign up information is provided on this After Visit Summary.  MyChart is used to connect with patients for Virtual Visits (Telemedicine).  Patients are able to view lab/test results, encounter notes, upcoming appointments, etc.  Non-urgent messages can be sent to your provider as well.   To learn more about what you can do with MyChart, go to [email or web address].      Signed, Ronneisha Jett, MD  02/09/2024 5:21 PM    [facility]     [1]  Current Meds  Medication Sig   albuterol  (VENTOLIN  HFA) 108 (90 Base) MCG/ACT inhaler Inhale 2 puffs into the lungs every 6 (six) hours as needed for wheezing or shortness of breath.   aspirin  EC 81 MG tablet Take 1 tablet (81 mg total) by mouth daily.   buPROPion  (WELLBUTRIN  XL) 150 MG 24 hr tablet Take 1 tablet (150 mg total) by mouth every  morning.   cetirizine  (ZYRTEC  ALLERGY ) 10 MG tablet Take 1 tablet (10 mg total) by mouth daily.   Coenzyme Q10 (COQ-10) 50 MG CAPS Take 1  tablet by mouth daily.   emtricitabine-rilpivir-tenofovir  AF (ODEFSEY ) 200-25-25 MG TABS tablet Take 1 tablet by mouth daily with breakfast.   fluticasone  (FLONASE ) 50 MCG/ACT nasal spray Place 1 spray into both nostrils daily.   lisinopril  (ZESTRIL ) 20 MG tablet TAKE 1 TABLET(20 MG) BY MOUTH DAILY   Multiple Vitamin (MULTI VITAMIN MENS PO) Take 1 tablet by mouth daily.   NON FORMULARY Has had aleve, alka seltzer   nystatin  (MYCOSTATIN ) 100000 UNIT/ML suspension Take 5 mLs (500,000 Units total) by mouth 4 (four) times daily.   tamsulosin  (FLOMAX ) 0.4 MG CAPS capsule Take 1 capsule (0.4 mg total) by mouth daily.

## 2024-02-09 ENCOUNTER — Ambulatory Visit: Attending: Cardiology | Admitting: Cardiology

## 2024-02-09 ENCOUNTER — Encounter: Payer: Self-pay | Admitting: Cardiology

## 2024-02-09 VITALS — BP 124/78 | HR 85 | Ht 68.0 in | Wt 174.9 lb

## 2024-02-09 DIAGNOSIS — I25118 Atherosclerotic heart disease of native coronary artery with other forms of angina pectoris: Secondary | ICD-10-CM | POA: Diagnosis not present

## 2024-02-09 DIAGNOSIS — E7800 Pure hypercholesterolemia, unspecified: Secondary | ICD-10-CM | POA: Diagnosis not present

## 2024-02-09 DIAGNOSIS — I10 Essential (primary) hypertension: Secondary | ICD-10-CM

## 2024-02-09 NOTE — Patient Instructions (Addendum)
 Medication Instructions:  Continue same medications *If you need a refill on your cardiac medications before your next appointment, please call your pharmacy*  Lab Work: Have fasting bmet,lipid and hepatic panels   Testing/Procedures: None ordered  Follow-Up: At [facility], you and your health needs are our priority.  As part of our continuing mission to provide you with exceptional heart care, our providers are all part of one team.  This team includes your primary Cardiologist (physician) and Advanced Practice Providers or APPs (Physician Assistants and Nurse Practitioners) who all work together to provide you with the care you need, when you need it.  Your next appointment:  1 year   Call in August to schedule Dec appointment     Provider:  Dr.Jordan          Scheduled appointment with Pharmacist in Lipid Clinic    We recommend signing up for the patient portal called MyChart.  Sign up information is provided on this After Visit Summary.  MyChart is used to connect with patients for Virtual Visits (Telemedicine).  Patients are able to view lab/test results, encounter notes, upcoming appointments, etc.  Non-urgent messages can be sent to your provider as well.   To learn more about what you can do with MyChart, go to [email or web address].

## 2024-02-24 ENCOUNTER — Ambulatory Visit: Payer: Self-pay | Admitting: Student

## 2024-02-24 VITALS — BP 178/96 | HR 85 | Temp 98.5°F | Ht 68.0 in | Wt 165.4 lb

## 2024-02-24 DIAGNOSIS — R350 Frequency of micturition: Secondary | ICD-10-CM

## 2024-02-24 DIAGNOSIS — E119 Type 2 diabetes mellitus without complications: Secondary | ICD-10-CM

## 2024-02-24 DIAGNOSIS — Z79899 Other long term (current) drug therapy: Secondary | ICD-10-CM

## 2024-02-24 DIAGNOSIS — C61 Malignant neoplasm of prostate: Secondary | ICD-10-CM | POA: Diagnosis not present

## 2024-02-24 DIAGNOSIS — Z833 Family history of diabetes mellitus: Secondary | ICD-10-CM

## 2024-02-24 DIAGNOSIS — I10 Essential (primary) hypertension: Secondary | ICD-10-CM

## 2024-02-24 DIAGNOSIS — Z87891 Personal history of nicotine dependence: Secondary | ICD-10-CM

## 2024-02-24 DIAGNOSIS — Z8042 Family history of malignant neoplasm of prostate: Secondary | ICD-10-CM

## 2024-02-24 DIAGNOSIS — Z131 Encounter for screening for diabetes mellitus: Secondary | ICD-10-CM

## 2024-02-24 DIAGNOSIS — R631 Polydipsia: Secondary | ICD-10-CM

## 2024-02-24 LAB — POCT URINALYSIS DIPSTICK
Bilirubin, UA: NEGATIVE
Glucose, UA: POSITIVE — AB
Ketones, UA: 40 — AB
Leukocytes, UA: NEGATIVE
Nitrite, UA: NEGATIVE
Protein, UA: NEGATIVE
Spec Grav, UA: 1.01
Urobilinogen, UA: 0.2 U/dL
pH, UA: 5.5

## 2024-02-24 NOTE — Progress Notes (Addendum)
 "  CC: Acute visit  HPI: [name] is a 68 y.o. male living with a history stated below and presents today for acute visit. Please see problem based assessment and plan for additional details.  Past Medical History:  Diagnosis Date   ALLERGIC RHINITIS    Allergic sinusitis    Anxiety state, unspecified    CAD S/P percutaneous coronary angioplasty 08/08/2013   mRCA  Xience Alpine DES 2.75 mm x 33 mm (3.0 mm)   Cerumen impaction, right    DEPRESSION    Diarrhea    Erectile dysfunction    EXTERNAL HEMORRHOIDS WITHOUT MENTION COMP    GERD    Hiatal hernia    HIV DISEASE    HIV infection (HCC)    HYPOGONADISM    Internal hemorrhoids    Non-ST elevated myocardial infarction (non-STEMI) (HCC) 08/08/2013   SubAcute100% RCA -- PCI; Echo: EF 55-605, Normal LV Size & Function - ~mild HK of Inferior Myocardium with mildly reduced RV function.  Normal Valve function   Periodontal disease    PERIPHERAL NEUROPATHY    PHARYNGITIS, RECURRENT    Pterygium of left eye    Tubular adenoma of colon    Urinary frequency    Urticaria    VERTIGO     Medications Ordered Prior to Encounter[1]  Family History  Problem Relation Age of Onset   Cancer Mother        brain tumor   Prostate cancer Father    Osteosarcoma Father    Alcohol abuse Father    Prostate cancer Brother    Diabetes Brother    Colon cancer Neg Hx    Esophageal cancer Neg Hx    Liver cancer Neg Hx    Rectal cancer Neg Hx    Stomach cancer Neg Hx     Social History   Socioeconomic History   Marital status: Single    Spouse name: Not on file   Number of children: Not on file   Years of education: Not on file   Highest education level: Not on file  Occupational History   Not on file  Tobacco Use   Smoking status: Former    Current packs/day: 0.00    Types: Cigarettes    Quit date: 03/02/2007    Years since quitting: 16.9    Passive exposure: Never   Smokeless tobacco: Never   Tobacco comments:    pt. no  longer smokes  Vaping Use   Vaping status: Never Used  Substance and Sexual Activity   Alcohol use: Yes    Alcohol/week: 8.0 standard drinks of alcohol    Types: 8 Cans of beer per week    Comment: sometimes.   Drug use: Not Currently    Types: Marijuana    Comment: daily    Sexual activity: Not Currently    Partners: Male    Comment: declined condoms  Other Topics Concern   Not on file  Social History Narrative   Not on file   Social Drivers of Health   Tobacco Use: Medium Risk (02/09/2024)   Patient History    Smoking Tobacco Use: Former    Smokeless Tobacco Use: Never    Passive Exposure: Never  Physicist, Medical Strain: Low Risk (02/24/2024)   Overall Financial Resource Strain (CARDIA)    Difficulty of Paying Living Expenses: Not hard at all  Food Insecurity: No Food Insecurity (02/24/2024)   Epic    Worried About Radiation Protection Practitioner of Food in the Last  Year: Never true    Ran Out of Food in the Last Year: Never true  Transportation Needs: No Transportation Needs (02/24/2024)   Epic    Lack of Transportation (Medical): No    Lack of Transportation (Non-Medical): No  Physical Activity: Inactive (02/24/2024)   Exercise Vital Sign    Days of Exercise per Week: 0 days    Minutes of Exercise per Session: 0 min  Stress: Stress Concern Present (02/24/2024)   [employer] of Occupational Health - Occupational Stress Questionnaire    Feeling of Stress: Rather much  Social Connections: Socially Isolated (02/24/2024)   Social Connection and Isolation Panel    Frequency of Communication with Friends and Family: Once a week    Frequency of Social Gatherings with Friends and Family: Twice a week    Attends Religious Services: Never    Database Administrator or Organizations: No    Attends Banker Meetings: Never    Marital Status: Never married  Intimate Partner Violence: Not At Risk (02/24/2024)   Epic    Fear of Current or Ex-Partner: No    Emotionally  Abused: No    Physically Abused: No    Sexually Abused: No  Depression (PHQ2-9): High Risk (02/24/2024)   Depression (PHQ2-9)    PHQ-2 Score: 11  Alcohol Screen: Low Risk (02/24/2024)   Alcohol Screen    Last Alcohol Screening Score (AUDIT): 3  Housing: Unknown (02/24/2024)   Epic    Unable to Pay for Housing in the Last Year: No    Number of Times Moved in the Last Year: Not on file    Homeless in the Last Year: No  Utilities: Not At Risk (02/24/2024)   Epic    Threatened with loss of utilities: No  Health Literacy: Adequate Health Literacy (02/24/2024)   B1300 Health Literacy    Frequency of need for help with medical instructions: Never    Review of Systems: ROS negative except for what is noted on the assessment and plan.  Vitals:   02/24/24 1457  BP: (!) 178/96  Pulse: 85  Temp: 98.5 F (36.9 C)  TempSrc: Oral  SpO2: 98%  Weight: 165 lb 6.4 oz (75 kg)  Height: 5' 8 (1.727 m)   Physical Exam: Constitutional: Alert, sitting up in chair, in no acute distress Cardiovascular: regular rate  Pulmonary/Chest: normal work of breathing on room air MSK: No LE edema Neurological: alert & oriented x 3  Assessment & Plan:   Assessment & Plan Urinary frequency Screening for diabetes mellitus (DM) Acute visit for 2-3 weeks of urinary frequency/urgency along with polydipsia.  States at times feels like he is not emptying his bladder completely.  He has Flomax  through his urologist but takes it inconsistently.  No prior history of these symptoms before.  Seen by urology for recently diagnosed prostate cancer, patient reports biopsy done in October.  Has not started any treatment due to patient processing this information regarding cancer diagnosis.  No history of diabetes in the past, with A1c normal 4 years ago.  Denies history of recent steroid use.  Bladder scan PVR of ~140.   Plan - UA with reflex to culture (urine dipstick not consistent with UTI) - BMP to assess renal  function - Check A1c given symptoms - Follow-up in 1 month - Encourage patient to follow-up with [facility]  Orders:   POCT Urinalysis Dipstick (18997)   UA/M w/rflx Culture, Routine   Basic metabolic panel with GFR  Hemoglobin A1c   Microscopic Examination  Essential hypertension BP today 178/96.  Patient reports did not take his lisinopril  20 mg yet today.  Asymptomatic at this time.  Prior notes question consistency.  Advised patient to take lisinopril  prior to next OV.  Prior discussions regarding adverse effects of uncontrolled hypertension.  Plan -Encourage patient to take lisinopril  daily and take it before next OV - If remains elevated may consider increasing lisinopril  or adding second agent    Type 2 diabetes mellitus without complication, without long-term current use of insulin  (HCC) ADDENDUM:  A1c 9.7, new onset, discussed insulin , metformin  and GLP-1. Afraid on needles/injections and would like to avoid at this time. SGLT-2 at later time given A1c and hyperglycemia. Will start metformin , Rx sent to pharmacy. Will repeat A1c in 3 months. May consider trial w/ Dr.Fox if eligible.       Reports recent diagnosis of prostate cancer Patient states recent diagnosis of prostate cancer by [facility].  He had a history of elevated PSA and reports biopsy done in October revealing prostate cancer.  CT abdomen pelvis in November showed mild prostatomegaly but no findings of metastatic disease.  Bone scan done as well with focal uptake in the right anterior eighth rib, query metastatic disease versus posttraumatic changes.  We have not received report/progress notes from [facility] recently with last report from September.  Will check if we have recent notes from them.  --------------- ADDENDUM 02/28/24: Discussed new onset diabetes w/ A1c 9.7. Discussed medications and opted for metformin  at this time.  BMP w/ K 5.6 and Ca 10.4. Advised patient to hold Lisinopril   until repeat blood work. Plan OV in 1 week and can obtain labs then. Future lab orders placed today.   -START metformin  500 mg daily and titrate up as tolerated weekly to 1000 mg BID -Referral to [name] -STOP lisinopril  until f/u and labs -Future BMP ordered -Messaged staff to schedule OV in 1 week (if unable I will plan for lab only visit next week to check K and Ca) -----------------  Return in about 4 weeks (around 03/23/2024) for urinary urgency/frequency.   Patient discussed with Dr. Machen  Meerab Maselli, D.O. [facility], PGY-3 Clinic Phone: [phone] Date 02/24/2024 Time 3:28 PM      [1]  Current Outpatient Medications on File Prior to Visit  Medication Sig Dispense Refill   albuterol  (VENTOLIN  HFA) 108 (90 Base) MCG/ACT inhaler Inhale 2 puffs into the lungs every 6 (six) hours as needed for wheezing or shortness of breath. 18 g 0   aspirin  EC 81 MG tablet Take 1 tablet (81 mg total) by mouth daily. 90 tablet 3   buPROPion  (WELLBUTRIN  XL) 150 MG 24 hr tablet Take 1 tablet (150 mg total) by mouth every morning. 30 tablet 2   cetirizine  (ZYRTEC  ALLERGY ) 10 MG tablet Take 1 tablet (10 mg total) by mouth daily. 30 tablet 0   Coenzyme Q10 (COQ-10) 50 MG CAPS Take 1 tablet by mouth daily.     emtricitabine -rilpivir-tenofovir  AF (ODEFSEY ) 200-25-25 MG TABS tablet Take 1 tablet by mouth daily with breakfast. 30 tablet 11   fluticasone  (FLONASE ) 50 MCG/ACT nasal spray Place 1 spray into both nostrils daily. 16 g 0   lisinopril  (ZESTRIL ) 20 MG tablet TAKE 1 TABLET(20 MG) BY MOUTH DAILY 90 tablet 1   Multiple Vitamin (MULTI VITAMIN MENS PO) Take 1 tablet by mouth daily.     NON FORMULARY Has had aleve, alka seltzer  nystatin  (MYCOSTATIN ) 100000 UNIT/ML suspension Take 5 mLs (500,000 Units total) by mouth 4 (four) times daily. 60 mL 0   tamsulosin  (FLOMAX ) 0.4 MG CAPS capsule Take 1 capsule (0.4 mg total) by mouth daily. 90 capsule 3   No current  facility-administered medications on file prior to visit.   "

## 2024-02-24 NOTE — Progress Notes (Unsigned)
 Error

## 2024-02-24 NOTE — Patient Instructions (Signed)
 Thank you, [name] for allowing us  to provide your care today. Today we discussed:  -Blood work today, I will call with results -Follow up with [facility]  -Bladder scan not suggesting you are retaining urine  -Try to take Flomax  on a more consistent schedule    Follow up: 1 month    Should you have any questions or concerns please call the internal medicine clinic at [phone].    Bentlee Drier, D.O. [facility]

## 2024-02-25 ENCOUNTER — Ambulatory Visit: Payer: Self-pay | Admitting: Student

## 2024-02-25 ENCOUNTER — Encounter: Payer: Self-pay | Admitting: Student

## 2024-02-25 DIAGNOSIS — E876 Hypokalemia: Secondary | ICD-10-CM

## 2024-02-25 DIAGNOSIS — E119 Type 2 diabetes mellitus without complications: Secondary | ICD-10-CM

## 2024-02-25 LAB — UA/M W/RFLX CULTURE, ROUTINE
Bilirubin, UA: NEGATIVE
Leukocytes,UA: NEGATIVE
Nitrite, UA: NEGATIVE
Protein,UA: NEGATIVE
RBC, UA: NEGATIVE
Urobilinogen, Ur: 0.2 mg/dL (ref 0.2–1.0)
pH, UA: 5.5 (ref 5.0–7.5)

## 2024-02-25 LAB — BASIC METABOLIC PANEL WITH GFR
BUN/Creatinine Ratio: 14 (ref 10–24)
BUN: 16 mg/dL (ref 8–27)
CO2: 26 mmol/L (ref 20–29)
Calcium: 10.4 mg/dL — ABNORMAL HIGH (ref 8.6–10.2)
Chloride: 96 mmol/L (ref 96–106)
Creatinine, Ser: 1.12 mg/dL (ref 0.76–1.27)
Glucose: 404 mg/dL — ABNORMAL HIGH (ref 70–99)
Potassium: 5.6 mmol/L — ABNORMAL HIGH (ref 3.5–5.2)
Sodium: 136 mmol/L (ref 134–144)
eGFR: 72 mL/min/1.73

## 2024-02-25 LAB — MICROSCOPIC EXAMINATION
Bacteria, UA: NONE SEEN
Casts: NONE SEEN /LPF
Epithelial Cells (non renal): NONE SEEN /HPF (ref 0–10)
RBC, Urine: NONE SEEN /HPF (ref 0–2)
WBC, UA: NONE SEEN /HPF (ref 0–5)

## 2024-02-25 LAB — HEMOGLOBIN A1C
Est. average glucose Bld gHb Est-mCnc: 232 mg/dL
Hgb A1c MFr Bld: 9.7 % — ABNORMAL HIGH (ref 4.8–5.6)

## 2024-02-25 NOTE — Assessment & Plan Note (Addendum)
 BP today 178/96.  Patient reports did not take his lisinopril  20 mg yet today.  Asymptomatic at this time.  Prior notes question consistency.  Advised patient to take lisinopril  prior to next OV.  Prior discussions regarding adverse effects of uncontrolled hypertension.  Plan -Encourage patient to take lisinopril  daily and take it before next OV - If remains elevated may consider increasing lisinopril  or adding second agent

## 2024-02-25 NOTE — Progress Notes (Signed)
 Internal Medicine Clinic Attending  Case discussed with the resident at the time of the visit.  We reviewed the residents history and exam and pertinent patient test results.  I agree with the assessment, diagnosis, and plan of care documented in the residents note.    A1C returned at 9.7, consistent with new diagnosis of T2DM. I think this is causing patient's polydipsia, which has led to dehydration. Pause Lisinopril  for now with his hyperkalemia.  Dr Elicia and I will call patient today to discuss medication options, and will arrange close clinic follow up

## 2024-02-28 DIAGNOSIS — E119 Type 2 diabetes mellitus without complications: Secondary | ICD-10-CM | POA: Insufficient documentation

## 2024-02-28 DIAGNOSIS — E139 Other specified diabetes mellitus without complications: Secondary | ICD-10-CM | POA: Insufficient documentation

## 2024-02-28 MED ORDER — METFORMIN HCL 500 MG PO TABS: 500.0000 mg | ORAL_TABLET | Freq: Every day | ORAL | 3 refills | Status: DC

## 2024-02-28 NOTE — Assessment & Plan Note (Signed)
 ADDENDUM:  A1c 9.7, new onset, discussed insulin , metformin  and GLP-1. Afraid on needles/injections and would like to avoid at this time. SGLT-2 at later time given A1c and hyperglycemia. Will start metformin , Rx sent to pharmacy. Will repeat A1c in 3 months. May consider trial w/ Dr.Fox if eligible.

## 2024-03-01 NOTE — Progress Notes (Signed)
 Pt has an appt 1/6 with Dr Harrie.

## 2024-03-01 NOTE — Progress Notes (Signed)
 Internal Medicine Clinic Attending  Case discussed with the resident at the time of the visit.  We reviewed the resident's history and exam and pertinent patient test results.  I agree with the assessment, diagnosis, and plan of care documented in the resident's note.

## 2024-03-01 NOTE — Telephone Encounter (Signed)
-----   Message from Ozell Nearing sent at 02/28/2024  2:44 PM EST ----- Can we arrange OV f/u this week (1/5-1/9) if able? If NOT, then he needs a lab-only visit this week to repeat BMP from last week (BMP already ordered). Thanks

## 2024-03-01 NOTE — Telephone Encounter (Signed)
 Called pt to schedule an appt per Dr Elicia - no answer; left message on vm to call the office.

## 2024-03-02 ENCOUNTER — Ambulatory Visit (INDEPENDENT_AMBULATORY_CARE_PROVIDER_SITE_OTHER): Admitting: Student

## 2024-03-02 ENCOUNTER — Emergency Department (HOSPITAL_COMMUNITY)

## 2024-03-02 ENCOUNTER — Encounter: Payer: Self-pay | Admitting: Student

## 2024-03-02 ENCOUNTER — Inpatient Hospital Stay (HOSPITAL_COMMUNITY)
Admission: EM | Admit: 2024-03-02 | Discharge: 2024-03-04 | DRG: 638 | Disposition: A | Discharge status: Home / Self Care

## 2024-03-02 ENCOUNTER — Other Ambulatory Visit: Payer: Self-pay

## 2024-03-02 VITALS — BP 133/96 | HR 102 | Temp 97.8°F | Ht 68.0 in | Wt 156.4 lb

## 2024-03-02 DIAGNOSIS — E861 Hypovolemia: Secondary | ICD-10-CM | POA: Diagnosis present

## 2024-03-02 DIAGNOSIS — K529 Noninfective gastroenteritis and colitis, unspecified: Secondary | ICD-10-CM | POA: Diagnosis not present

## 2024-03-02 DIAGNOSIS — Z7982 Long term (current) use of aspirin: Secondary | ICD-10-CM

## 2024-03-02 DIAGNOSIS — E871 Hypo-osmolality and hyponatremia: Secondary | ICD-10-CM | POA: Diagnosis present

## 2024-03-02 DIAGNOSIS — E1110 Type 2 diabetes mellitus with ketoacidosis without coma: Secondary | ICD-10-CM | POA: Diagnosis not present

## 2024-03-02 DIAGNOSIS — Z955 Presence of coronary angioplasty implant and graft: Secondary | ICD-10-CM

## 2024-03-02 DIAGNOSIS — E785 Hyperlipidemia, unspecified: Secondary | ICD-10-CM | POA: Diagnosis present

## 2024-03-02 DIAGNOSIS — N400 Enlarged prostate without lower urinary tract symptoms: Secondary | ICD-10-CM | POA: Diagnosis present

## 2024-03-02 DIAGNOSIS — F1210 Cannabis abuse, uncomplicated: Secondary | ICD-10-CM | POA: Diagnosis present

## 2024-03-02 DIAGNOSIS — Z87891 Personal history of nicotine dependence: Secondary | ICD-10-CM

## 2024-03-02 DIAGNOSIS — B20 Human immunodeficiency virus [HIV] disease: Secondary | ICD-10-CM | POA: Diagnosis present

## 2024-03-02 DIAGNOSIS — Z7984 Long term (current) use of oral hypoglycemic drugs: Secondary | ICD-10-CM | POA: Diagnosis not present

## 2024-03-02 DIAGNOSIS — F32A Depression, unspecified: Secondary | ICD-10-CM | POA: Diagnosis present

## 2024-03-02 DIAGNOSIS — Z8042 Family history of malignant neoplasm of prostate: Secondary | ICD-10-CM

## 2024-03-02 DIAGNOSIS — Z833 Family history of diabetes mellitus: Secondary | ICD-10-CM

## 2024-03-02 DIAGNOSIS — C61 Malignant neoplasm of prostate: Secondary | ICD-10-CM | POA: Diagnosis present

## 2024-03-02 DIAGNOSIS — Z882 Allergy status to sulfonamides status: Secondary | ICD-10-CM

## 2024-03-02 DIAGNOSIS — E1142 Type 2 diabetes mellitus with diabetic polyneuropathy: Secondary | ICD-10-CM | POA: Diagnosis present

## 2024-03-02 DIAGNOSIS — R059 Cough, unspecified: Secondary | ICD-10-CM | POA: Diagnosis present

## 2024-03-02 DIAGNOSIS — D751 Secondary polycythemia: Secondary | ICD-10-CM | POA: Diagnosis present

## 2024-03-02 DIAGNOSIS — I2510 Atherosclerotic heart disease of native coronary artery without angina pectoris: Secondary | ICD-10-CM | POA: Diagnosis present

## 2024-03-02 DIAGNOSIS — E860 Dehydration: Secondary | ICD-10-CM | POA: Diagnosis present

## 2024-03-02 DIAGNOSIS — Z888 Allergy status to other drugs, medicaments and biological substances status: Secondary | ICD-10-CM

## 2024-03-02 DIAGNOSIS — Z811 Family history of alcohol abuse and dependence: Secondary | ICD-10-CM

## 2024-03-02 DIAGNOSIS — I214 Non-ST elevation (NSTEMI) myocardial infarction: Secondary | ICD-10-CM | POA: Diagnosis present

## 2024-03-02 DIAGNOSIS — I252 Old myocardial infarction: Secondary | ICD-10-CM

## 2024-03-02 DIAGNOSIS — I10 Essential (primary) hypertension: Secondary | ICD-10-CM | POA: Diagnosis present

## 2024-03-02 DIAGNOSIS — E88819 Insulin resistance, unspecified: Secondary | ICD-10-CM | POA: Diagnosis present

## 2024-03-02 DIAGNOSIS — Z79899 Other long term (current) drug therapy: Secondary | ICD-10-CM

## 2024-03-02 DIAGNOSIS — Z808 Family history of malignant neoplasm of other organs or systems: Secondary | ICD-10-CM

## 2024-03-02 DIAGNOSIS — Z885 Allergy status to narcotic agent status: Secondary | ICD-10-CM

## 2024-03-02 LAB — CBC WITH DIFFERENTIAL/PLATELET
Abs Immature Granulocytes: 0.03 K/uL (ref 0.00–0.07)
Abs Immature Granulocytes: 0.05 K/uL (ref 0.00–0.07)
Basophils Absolute: 0 K/uL (ref 0.0–0.1)
Basophils Absolute: 0 K/uL (ref 0.0–0.1)
Basophils Relative: 0 %
Basophils Relative: 0 %
Eosinophils Absolute: 0.1 K/uL (ref 0.0–0.5)
Eosinophils Absolute: 0.2 K/uL (ref 0.0–0.5)
Eosinophils Relative: 1 %
Eosinophils Relative: 2 %
HCT: 51.4 % (ref 39.0–52.0)
HCT: 51.7 % (ref 39.0–52.0)
Hemoglobin: 17.7 g/dL — ABNORMAL HIGH (ref 13.0–17.0)
Hemoglobin: 18 g/dL — ABNORMAL HIGH (ref 13.0–17.0)
Immature Granulocytes: 0 %
Immature Granulocytes: 1 %
Lymphocytes Relative: 22 %
Lymphocytes Relative: 23 %
Lymphs Abs: 1.9 K/uL (ref 0.7–4.0)
Lymphs Abs: 2.2 K/uL (ref 0.7–4.0)
MCH: 32.4 pg (ref 26.0–34.0)
MCH: 32.2 pg (ref 26.0–34.0)
MCHC: 34.4 g/dL (ref 30.0–36.0)
MCHC: 34.8 g/dL (ref 30.0–36.0)
MCV: 94 fL (ref 80.0–100.0)
MCV: 92.5 fL (ref 80.0–100.0)
Monocytes Absolute: 1 K/uL (ref 0.1–1.0)
Monocytes Absolute: 1.1 K/uL — ABNORMAL HIGH (ref 0.1–1.0)
Monocytes Relative: 11 %
Monocytes Relative: 12 %
Neutro Abs: 5.7 K/uL (ref 1.7–7.7)
Neutro Abs: 6.1 K/uL (ref 1.7–7.7)
Neutrophils Relative %: 66 %
Neutrophils Relative %: 62 %
Platelets: 199 K/uL (ref 150–400)
Platelets: 212 K/uL (ref 150–400)
RBC: 5.47 MIL/uL (ref 4.22–5.81)
RBC: 5.59 MIL/uL (ref 4.22–5.81)
RDW: 11.2 % — ABNORMAL LOW (ref 11.5–15.5)
RDW: 11.3 % — ABNORMAL LOW (ref 11.5–15.5)
WBC: 8.7 K/uL (ref 4.0–10.5)
WBC: 9.7 K/uL (ref 4.0–10.5)
nRBC: 0 % (ref 0.0–0.2)
nRBC: 0 % (ref 0.0–0.2)

## 2024-03-02 LAB — TROPONIN T, HIGH SENSITIVITY
Troponin T High Sensitivity: <15 ng/L (ref 0–19)
Troponin T High Sensitivity: <15 ng/L (ref 0–19)

## 2024-03-02 LAB — I-STAT VENOUS BLOOD GAS, ED
Acid-base deficit: 6 mmol/L — ABNORMAL HIGH (ref 0.0–2.0)
Bicarbonate: 17.7 mmol/L — ABNORMAL LOW (ref 20.0–28.0)
Calcium, Ion: 1.22 mmol/L (ref 1.15–1.40)
HCT: 53 % — ABNORMAL HIGH (ref 39.0–52.0)
Hemoglobin: 18 g/dL — ABNORMAL HIGH (ref 13.0–17.0)
O2 Saturation: 69 %
Potassium: 4.2 mmol/L (ref 3.5–5.1)
Sodium: 130 mmol/L — ABNORMAL LOW (ref 135–145)
TCO2: 19 mmol/L — ABNORMAL LOW (ref 22–32)
pCO2, Ven: 31.4 mmHg — ABNORMAL LOW (ref 44–60)
pH, Ven: 7.36 (ref 7.25–7.43)
pO2, Ven: 37 mmHg (ref 32–45)

## 2024-03-02 LAB — COMPREHENSIVE METABOLIC PANEL WITH GFR
ALT: 34 U/L (ref 0–44)
ALT: 34 U/L (ref 0–44)
AST: 23 U/L (ref 15–41)
AST: 24 U/L (ref 15–41)
Albumin: 4.6 g/dL (ref 3.5–5.0)
Albumin: 4.7 g/dL (ref 3.5–5.0)
Alkaline Phosphatase: 130 U/L — ABNORMAL HIGH (ref 38–126)
Alkaline Phosphatase: 131 U/L — ABNORMAL HIGH (ref 38–126)
Anion gap: 24 — ABNORMAL HIGH (ref 5–15)
Anion gap: 22 — ABNORMAL HIGH (ref 5–15)
BUN: 26 mg/dL — ABNORMAL HIGH (ref 8–23)
BUN: 27 mg/dL — ABNORMAL HIGH (ref 8–23)
CO2: 13 mmol/L — ABNORMAL LOW (ref 22–32)
CO2: 16 mmol/L — ABNORMAL LOW (ref 22–32)
Calcium: 9.6 mg/dL (ref 8.9–10.3)
Calcium: 9.6 mg/dL (ref 8.9–10.3)
Chloride: 95 mmol/L — ABNORMAL LOW (ref 98–111)
Chloride: 93 mmol/L — ABNORMAL LOW (ref 98–111)
Creatinine, Ser: 1.17 mg/dL (ref 0.61–1.24)
Creatinine, Ser: 1.13 mg/dL (ref 0.61–1.24)
GFR, Estimated: >60 mL/min
GFR, Estimated: >60 mL/min
Glucose, Bld: 298 mg/dL — ABNORMAL HIGH (ref 70–99)
Glucose, Bld: 275 mg/dL — ABNORMAL HIGH (ref 70–99)
Potassium: 4.7 mmol/L (ref 3.5–5.1)
Potassium: 4.2 mmol/L (ref 3.5–5.1)
Sodium: 132 mmol/L — ABNORMAL LOW (ref 135–145)
Sodium: 131 mmol/L — ABNORMAL LOW (ref 135–145)
Total Bilirubin: 0.6 mg/dL (ref 0.0–1.2)
Total Bilirubin: 0.6 mg/dL (ref 0.0–1.2)
Total Protein: 7.5 g/dL (ref 6.5–8.1)
Total Protein: 7.5 g/dL (ref 6.5–8.1)

## 2024-03-02 LAB — POCT URINALYSIS DIPSTICK
Glucose, UA: POSITIVE — AB
Ketones, UA: >=160 — AB
Leukocytes, UA: NEGATIVE
Nitrite, UA: NEGATIVE
Protein, UA: POSITIVE — AB
Spec Grav, UA: >=1.03 — AB
Urobilinogen, UA: 0.2 U/dL
pH, UA: 5.5

## 2024-03-02 LAB — URINALYSIS, ROUTINE W REFLEX MICROSCOPIC
Bilirubin Urine: NEGATIVE
Glucose, UA: >=500 mg/dL — AB
Ketones, ur: 80 mg/dL — AB
Leukocytes,Ua: NEGATIVE
Nitrite: NEGATIVE
Protein, ur: 100 mg/dL — AB
Specific Gravity, Urine: 1.038 — ABNORMAL HIGH (ref 1.005–1.030)
pH: 5 (ref 5.0–8.0)

## 2024-03-02 LAB — POC SOFIA 2 FLU + SARS ANTIGEN FIA
Influenza A, POC: NEGATIVE
Influenza B, POC: NEGATIVE
SARS Coronavirus 2 Ag: NEGATIVE

## 2024-03-02 LAB — CBG MONITORING, ED: Glucose-Capillary: 268 mg/dL — ABNORMAL HIGH (ref 70–99)

## 2024-03-02 LAB — GLUCOSE, CAPILLARY: Glucose-Capillary: 292 mg/dL — ABNORMAL HIGH (ref 70–99)

## 2024-03-02 LAB — OSMOLALITY: Osmolality: 303 mosm/kg — ABNORMAL HIGH (ref 275–295)

## 2024-03-02 LAB — LIPASE, BLOOD
Lipase: 29 U/L (ref 11–51)
Lipase: 54 U/L — ABNORMAL HIGH (ref 11–51)

## 2024-03-02 LAB — BETA-HYDROXYBUTYRIC ACID: Beta-Hydroxybutyric Acid: 6.76 mmol/L — ABNORMAL HIGH (ref 0.05–0.27)

## 2024-03-02 MED ORDER — SODIUM CHLORIDE 0.9 % IV BOLUS: 1000.0000 mL | Freq: Once | INTRAVENOUS | Status: DC

## 2024-03-02 MED ORDER — METOCLOPRAMIDE HCL 10 MG PO TABS: 20.0000 mg | ORAL_TABLET | Freq: Three times a day (TID) | ORAL | 1 refills | Status: AC

## 2024-03-02 NOTE — Patient Instructions (Signed)
 Take reglan  3 times daily as needed  Be sure to maintain hydration. Attempt 2L of fluid daily.  Start adding bland food to your diet slowly but as soon as possible.  Do not take metformin  as long as you are unable to tolerate food, but once you return to small meals, restart metformin . Hopefully this will happen within a day or two.  If you are unable to tolerate any liquid or food by mouth, call the clinic again and we may need to admit you to the hospital.  If your bloodwork today shows concerning signs, I am going to call and recommend you go to the ER.  Please return in one week for a check-up.

## 2024-03-02 NOTE — Progress Notes (Addendum)
 "  CC: Nausea, malaise, emesis  HPI:  Albert Brown is a 69 y.o. male with a PMH stated below who presents today for evaluation of nausea, abdominal pain, emesis, malaise. He reports he feels terrible. He was recently diagnosed with T2DM.  Please see problem based assessment and plan for additional details.  Past Medical History:  Diagnosis Date   ALLERGIC RHINITIS    Allergic sinusitis    Anxiety state, unspecified    CAD S/P percutaneous coronary angioplasty 08/08/2013   mRCA  Xience Alpine DES 2.75 mm x 33 mm (3.0 mm)   Cerumen impaction, right    DEPRESSION    Diarrhea    Erectile dysfunction    EXTERNAL HEMORRHOIDS WITHOUT MENTION COMP    GERD    Hiatal hernia    HIV DISEASE    HIV infection (HCC)    HYPOGONADISM    Internal hemorrhoids    Non-ST elevated myocardial infarction (non-STEMI) (HCC) 08/08/2013   SubAcute100% RCA -- PCI; Echo: EF 55-605, Normal LV Size & Function - ~mild HK of Inferior Myocardium with mildly reduced RV function.  Normal Valve function   Periodontal disease    PERIPHERAL NEUROPATHY    PHARYNGITIS, RECURRENT    Pterygium of left eye    Tubular adenoma of colon    Urinary frequency    Urticaria    VERTIGO     Review of Systems: ROS negative except for what is noted on the assessment and plan.  Vitals:   03/02/24 0946 03/02/24 0952  BP: (!) 139/94 (!) 133/96  Pulse: (!) 108 (!) 102  Temp: 97.8 F (36.6 C)   TempSrc: Oral   SpO2: 98%   Weight: 156 lb 6.4 oz (70.9 kg)   Height: 5' 8 (1.727 m)     Physical Exam: Constitutional: ill-appearing man in fetal position on exam table and eyes closed. He is able to sit upright and converse appropriately without distress. HENT: normocephalic atraumatic, mucous membranes moist Cardiovascular: tachycardic and regular rhythm, no m/r/g Pulmonary/Chest: normal work of breathing on room air, lungs clear to auscultation bilaterally Abdominal: soft, mild diffuse tenderness without mass or  guarding MSK: normal bulk and tone Neurological: alert & oriented x 3, no focal deficit Skin: warm and dry. Reduced skin turgor. Psych: normal mood and behavior  Assessment & Plan:   Patient seen with Dr. Lovie Assessment & Plan Diabetic ketoacidosis without coma associated with type 2 diabetes mellitus (HCC) Presents with 3 days of severe malaise with nausea and diffuse abdominal pain.  This is in the setting of 3 weeks of fatigue, polyuria and polydipsia, culminating in a diagnosis of diabetes with an A1c of 9.7 last week with office sugars 400.  At that point insulin  and aggressive therapy was discussed but opted to start with treatment of metformin  which was started on Saturday.  Sunday morning he developed profuse non-bloody emesis, which he describes as vomiting everything except my intestines.  There is no recurrent emesis since Sunday but he has been unable to eat since then, although is able to take small liquids by mouth.  He describes himself as completely miserable and feels that death would be a relief in comparison.  Symptoms could reflect a severe viral process but in the setting of new severe diabetes I have concern of DKA and/or gastroparesis.  Initial point of care testing in office revealed a blood sugar of 292, ketones in the urine, viral swab negative.  He tolerated oral rehydration in the office and  we discussed referral to the hospital for further workup and IV fluids versus tentative treatment at home pending his additional stat labs that are drawn and in process.  He would like to avoid the hospital right now so he can go home with reglan  but he knows to expect a call should his urgent labs reveal an issue.  Update: CMP result showing an elevated anion gap of 24 and bicarbonate of 13 consistent with an anion gap metabolic acidosis and with these settings suggest diabetic ketoacidosis.  I called the patient with this update and he was resting in bed and said that he  actually feels a little bit better having taken a dose of Reglan , however I advised that he present to the ER for further treatment.  He was in agreement and will go to the ER shortly.  Lonni Africa, D.O. [facility] Internal Medicine, PGY-2 Phone: [phone] Date 03/02/2024 Time 2:33 PM "

## 2024-03-02 NOTE — ED Provider Triage Note (Signed)
 Emergency Medicine Provider Triage Evaluation Note  Albert Brown , a 69 y.o. male  was evaluated in triage.  Pt complains of DKA.  Was recently diagnosed with diabetes last week and was placed on metformin  this weekend.  Notes he developed nausea and vomiting over the weekend and has not been able to eat in 2 days.  Is having some shortness of breath.  Saw PCP earlier today and told he was indicated and to come to ED.  Notes symptoms previously were increased thirst and urination.  History of HIV and prostate cancer.  Review of Systems  Positive: See above Negative: Headache, dizziness, chest pain, abdominal pain, diarrhea  Physical Exam  BP (!) 147/99 (BP Location: Right Arm)   Pulse (!) 115   Temp 98.2 F (36.8 C)   Resp 16   Ht 5' 8 (1.727 m)   Wt 70.8 kg   SpO2 97%   BMI 23.72 kg/m  Gen:   Awake, no distress   Resp:  Normal effort  MSK:   Moves extremities without difficulty  Other:    Medical Decision Making  Medically screening exam initiated at 7:38 PM.  Appropriate orders placed.  Albert Brown was informed that the remainder of the evaluation will be completed by another provider, this initial triage assessment does not replace that evaluation, and the importance of remaining in the ED until their evaluation is complete.     Albert Brown, [place] 03/02/24 1941

## 2024-03-02 NOTE — ED Triage Notes (Signed)
 Patient was sent from PCP office for DKA workup. He states he had labs done this morning and was called and told him to come to ED. He reports Increased urination, increased thirst. He has been having nausea and vomiting since Saturday night. He has not been able to keep any food down for the past couple days. He started metformin  on Saturday and was unaware he had diabetes until this past Saturday.

## 2024-03-02 NOTE — Progress Notes (Signed)
 Internal Medicine Clinic Attending  I was physically present during the key portions of the resident provided service and participated in the medical decision making of patient's management care. I reviewed pertinent patient test results.  The assessment, diagnosis, and plan were formulated together and I agree with the documentation in the resident's note.  Lovie Clarity, MD    Dr Harrie and I were worried about this sick patient. We have reviewed his stat labs, which confirm that he is in DKA Patient will go to the Emergency Room now, and should be admitted to the IMTS service for evaluation and treatment of DKA. Prior to discharge, consider starting once daily insulin  - he did not tolerate metformin  and I don't think starting SGLT2i is safe

## 2024-03-02 NOTE — Hospital Course (Addendum)
#  Diabetic Ketoacidosis #Newly Diagnosed T2DM Patient presenting with severe malaise, diffuse abdominal pain, and a few weeks of polyuria, fatigue, increased thirst. Last A1c of 9.7% on 02/24/24. Labs at [facility] appointment earlier today showed blood sugar of 292, ketonuria, bicarb of 13 with elevated AG of 24. Beta hydroxybutyrate increased to 6.76 from outpatient labs. Started patient on insulin  EndoTool, while monitoring CBGs hourly and BMP, beta-hydroxybutyrate and K+ monitoring every 4 hours. There was concern for LADA given this patients appropriate BMI and late age onset of DM, so screened patient for auto-antibodies including anti-GAD, ZNT8, and IA-2 which have not resulted as of yet.  Patient was transitioned off of Endotool while still receiving D5 LR and continuing CBG monitoring.  Patient received 20 units subcu long-acting insulin  with 5 units for mealtime coverage, and CBGs and BMPs continued to improve.  Anion gap improved to 7 by day of discharge, and diabetes counselor educated patient on insulin  use and diabetes.  #CAD s/p DES mRCA #NSTEMI History of CAD s/p DES to [facility] on 08/08/2013. Endorsed abdominal pain but no active chest pain. EKG was without concerning abnormalities, troponin negative. Placed on telemetry for concerns for electrolyte shifts with insulin  treatment for DKA and continued home ASA.  #HIV Follows with Dr. Fleeta Rothman, last appt October 2025. Currently taking Odefsey . Last viral load undetectable with CD4 of 647. No acute concerns for infectious process, resumed home Odefsey .  #Hypertension Initial BP in the ED of 140-150s/90-100s. Recently stopped taking Lisinopril  20 mg daily due to concerns of potassium depletion. Held home antihypertensive and monitored pressures.  #Polycythemia Likely in the setting of dehydration from excessive GI loss d/t DKA, CBC improved greatly with correction of his DKA.

## 2024-03-03 ENCOUNTER — Encounter (HOSPITAL_COMMUNITY): Payer: Self-pay

## 2024-03-03 ENCOUNTER — Telehealth (HOSPITAL_COMMUNITY): Payer: Self-pay

## 2024-03-03 ENCOUNTER — Other Ambulatory Visit (HOSPITAL_COMMUNITY): Payer: Self-pay

## 2024-03-03 DIAGNOSIS — C61 Malignant neoplasm of prostate: Secondary | ICD-10-CM | POA: Diagnosis present

## 2024-03-03 DIAGNOSIS — Z79899 Other long term (current) drug therapy: Secondary | ICD-10-CM | POA: Diagnosis not present

## 2024-03-03 DIAGNOSIS — B20 Human immunodeficiency virus [HIV] disease: Secondary | ICD-10-CM

## 2024-03-03 DIAGNOSIS — Z811 Family history of alcohol abuse and dependence: Secondary | ICD-10-CM | POA: Diagnosis not present

## 2024-03-03 DIAGNOSIS — Z955 Presence of coronary angioplasty implant and graft: Secondary | ICD-10-CM | POA: Diagnosis not present

## 2024-03-03 DIAGNOSIS — E785 Hyperlipidemia, unspecified: Secondary | ICD-10-CM

## 2024-03-03 DIAGNOSIS — I252 Old myocardial infarction: Secondary | ICD-10-CM | POA: Diagnosis not present

## 2024-03-03 DIAGNOSIS — Z87891 Personal history of nicotine dependence: Secondary | ICD-10-CM | POA: Diagnosis not present

## 2024-03-03 DIAGNOSIS — E1142 Type 2 diabetes mellitus with diabetic polyneuropathy: Secondary | ICD-10-CM | POA: Diagnosis present

## 2024-03-03 DIAGNOSIS — I214 Non-ST elevation (NSTEMI) myocardial infarction: Secondary | ICD-10-CM | POA: Diagnosis not present

## 2024-03-03 DIAGNOSIS — F32A Depression, unspecified: Secondary | ICD-10-CM

## 2024-03-03 DIAGNOSIS — E860 Dehydration: Secondary | ICD-10-CM | POA: Diagnosis present

## 2024-03-03 DIAGNOSIS — E871 Hypo-osmolality and hyponatremia: Secondary | ICD-10-CM

## 2024-03-03 DIAGNOSIS — Z8042 Family history of malignant neoplasm of prostate: Secondary | ICD-10-CM | POA: Diagnosis not present

## 2024-03-03 DIAGNOSIS — D751 Secondary polycythemia: Secondary | ICD-10-CM | POA: Diagnosis present

## 2024-03-03 DIAGNOSIS — Z7982 Long term (current) use of aspirin: Secondary | ICD-10-CM | POA: Diagnosis not present

## 2024-03-03 DIAGNOSIS — Z7984 Long term (current) use of oral hypoglycemic drugs: Secondary | ICD-10-CM | POA: Diagnosis not present

## 2024-03-03 DIAGNOSIS — J309 Allergic rhinitis, unspecified: Secondary | ICD-10-CM

## 2024-03-03 DIAGNOSIS — E861 Hypovolemia: Secondary | ICD-10-CM | POA: Diagnosis present

## 2024-03-03 DIAGNOSIS — I2510 Atherosclerotic heart disease of native coronary artery without angina pectoris: Secondary | ICD-10-CM

## 2024-03-03 DIAGNOSIS — Z833 Family history of diabetes mellitus: Secondary | ICD-10-CM | POA: Diagnosis not present

## 2024-03-03 DIAGNOSIS — E1110 Type 2 diabetes mellitus with ketoacidosis without coma: Secondary | ICD-10-CM | POA: Diagnosis present

## 2024-03-03 DIAGNOSIS — I10 Essential (primary) hypertension: Secondary | ICD-10-CM

## 2024-03-03 DIAGNOSIS — Z808 Family history of malignant neoplasm of other organs or systems: Secondary | ICD-10-CM | POA: Diagnosis not present

## 2024-03-03 DIAGNOSIS — E88819 Insulin resistance, unspecified: Secondary | ICD-10-CM | POA: Diagnosis present

## 2024-03-03 DIAGNOSIS — N400 Enlarged prostate without lower urinary tract symptoms: Secondary | ICD-10-CM | POA: Diagnosis present

## 2024-03-03 DIAGNOSIS — Z794 Long term (current) use of insulin: Secondary | ICD-10-CM

## 2024-03-03 DIAGNOSIS — F1210 Cannabis abuse, uncomplicated: Secondary | ICD-10-CM | POA: Diagnosis present

## 2024-03-03 LAB — CBG MONITORING, ED
Glucose-Capillary: 270 mg/dL — ABNORMAL HIGH (ref 70–99)
Glucose-Capillary: 213 mg/dL — ABNORMAL HIGH (ref 70–99)
Glucose-Capillary: 241 mg/dL — ABNORMAL HIGH (ref 70–99)
Glucose-Capillary: 204 mg/dL — ABNORMAL HIGH (ref 70–99)
Glucose-Capillary: 217 mg/dL — ABNORMAL HIGH (ref 70–99)
Glucose-Capillary: 159 mg/dL — ABNORMAL HIGH (ref 70–99)
Glucose-Capillary: 148 mg/dL — ABNORMAL HIGH (ref 70–99)
Glucose-Capillary: 141 mg/dL — ABNORMAL HIGH (ref 70–99)
Glucose-Capillary: 124 mg/dL — ABNORMAL HIGH (ref 70–99)
Glucose-Capillary: 285 mg/dL — ABNORMAL HIGH (ref 70–99)
Glucose-Capillary: 318 mg/dL — ABNORMAL HIGH (ref 70–99)
Glucose-Capillary: 211 mg/dL — ABNORMAL HIGH (ref 70–99)

## 2024-03-03 LAB — TSH: TSH: 2.54 u[IU]/mL (ref 0.350–4.500)

## 2024-03-03 LAB — BASIC METABOLIC PANEL WITH GFR
Anion gap: 20 — ABNORMAL HIGH (ref 5–15)
Anion gap: 16 — ABNORMAL HIGH (ref 5–15)
Anion gap: 9 (ref 5–15)
Anion gap: 12 (ref 5–15)
BUN: 28 mg/dL — ABNORMAL HIGH (ref 8–23)
BUN: 24 mg/dL — ABNORMAL HIGH (ref 8–23)
BUN: 21 mg/dL (ref 8–23)
BUN: 19 mg/dL (ref 8–23)
CO2: 17 mmol/L — ABNORMAL LOW (ref 22–32)
CO2: 17 mmol/L — ABNORMAL LOW (ref 22–32)
CO2: 23 mmol/L (ref 22–32)
CO2: 22 mmol/L (ref 22–32)
Calcium: 9.6 mg/dL (ref 8.9–10.3)
Calcium: 8.9 mg/dL (ref 8.9–10.3)
Calcium: 8.9 mg/dL (ref 8.9–10.3)
Calcium: 9.3 mg/dL (ref 8.9–10.3)
Chloride: 93 mmol/L — ABNORMAL LOW (ref 98–111)
Chloride: 99 mmol/L (ref 98–111)
Chloride: 101 mmol/L (ref 98–111)
Chloride: 100 mmol/L (ref 98–111)
Creatinine, Ser: 1.18 mg/dL (ref 0.61–1.24)
Creatinine, Ser: 0.97 mg/dL (ref 0.61–1.24)
Creatinine, Ser: 0.94 mg/dL (ref 0.61–1.24)
Creatinine, Ser: 0.97 mg/dL (ref 0.61–1.24)
GFR, Estimated: >60 mL/min
GFR, Estimated: >60 mL/min
GFR, Estimated: >60 mL/min
GFR, Estimated: >60 mL/min
Glucose, Bld: 256 mg/dL — ABNORMAL HIGH (ref 70–99)
Glucose, Bld: 181 mg/dL — ABNORMAL HIGH (ref 70–99)
Glucose, Bld: 119 mg/dL — ABNORMAL HIGH (ref 70–99)
Glucose, Bld: 396 mg/dL — ABNORMAL HIGH (ref 70–99)
Potassium: 5.1 mmol/L (ref 3.5–5.1)
Potassium: 4.1 mmol/L (ref 3.5–5.1)
Potassium: 3.6 mmol/L (ref 3.5–5.1)
Potassium: 3.8 mmol/L (ref 3.5–5.1)
Sodium: 130 mmol/L — ABNORMAL LOW (ref 135–145)
Sodium: 132 mmol/L — ABNORMAL LOW (ref 135–145)
Sodium: 133 mmol/L — ABNORMAL LOW (ref 135–145)
Sodium: 134 mmol/L — ABNORMAL LOW (ref 135–145)

## 2024-03-03 LAB — BETA-HYDROXYBUTYRIC ACID
Beta-Hydroxybutyric Acid: 5.76 mmol/L — ABNORMAL HIGH (ref 0.05–0.27)
Beta-Hydroxybutyric Acid: 3.43 mmol/L — ABNORMAL HIGH (ref 0.05–0.27)
Beta-Hydroxybutyric Acid: 0.74 mmol/L — ABNORMAL HIGH (ref 0.05–0.27)
Beta-Hydroxybutyric Acid: 1.01 mmol/L — ABNORMAL HIGH (ref 0.05–0.27)

## 2024-03-03 MED ORDER — INSULIN GLARGINE-YFGN 100 UNIT/ML ~~LOC~~ SOLN: 20.0000 [IU] | SUBCUTANEOUS | Status: DC

## 2024-03-03 MED ORDER — INSULIN ASPART 100 UNIT/ML IJ SOLN
5.0000 [IU] | Freq: Three times a day (TID) | INTRAMUSCULAR | Status: DC
  Administered 2024-03-04: 5 [IU] via SUBCUTANEOUS
  Filled 2024-03-03: qty 5

## 2024-03-03 MED ORDER — TAMSULOSIN HCL 0.4 MG PO CAPS
0.4000 mg | ORAL_CAPSULE | Freq: Every day | ORAL | Status: DC
  Administered 2024-03-03 – 2024-03-04 (×2): 0.4 mg via ORAL
  Filled 2024-03-03 (×2): qty 1

## 2024-03-03 MED ORDER — METOCLOPRAMIDE HCL 5 MG/ML IJ SOLN
10.0000 mg | Freq: Once | INTRAMUSCULAR | Status: DC
  Filled 2024-03-03: qty 2

## 2024-03-03 MED ORDER — INSULIN ASPART 100 UNIT/ML IJ SOLN
0.0000 [IU] | Freq: Three times a day (TID) | INTRAMUSCULAR | Status: DC
  Administered 2024-03-03: 8 [IU] via SUBCUTANEOUS
  Filled 2024-03-03: qty 8

## 2024-03-03 MED ORDER — INSULIN GLARGINE-YFGN 100 UNIT/ML ~~LOC~~ SOLN
10.0000 [IU] | Freq: Once | SUBCUTANEOUS | Status: AC
  Administered 2024-03-03: 10 [IU] via SUBCUTANEOUS
  Filled 2024-03-03: qty 0.1

## 2024-03-03 MED ORDER — EMTRICITAB-RILPIVIR-TENOFOV AF 200-25-25 MG PO TABS
1.0000 | ORAL_TABLET | Freq: Every day | ORAL | Status: DC
  Administered 2024-03-03: 1 via ORAL
  Filled 2024-03-03 (×4): qty 1

## 2024-03-03 MED ORDER — LACTATED RINGERS IV BOLUS
20.0000 mL/kg | Freq: Once | INTRAVENOUS | Status: AC
  Administered 2024-03-03: 1416 mL via INTRAVENOUS

## 2024-03-03 MED ORDER — DEXTROSE 50 % IV SOLN: 0.0000 mL | INTRAVENOUS | Status: DC | PRN

## 2024-03-03 MED ORDER — FLUTICASONE PROPIONATE 50 MCG/ACT NA SUSP
1.0000 | Freq: Every day | NASAL | Status: DC
  Filled 2024-03-03: qty 16

## 2024-03-03 MED ORDER — DEXTROSE IN LACTATED RINGERS 5 % IV SOLN: INTRAVENOUS | Status: DC

## 2024-03-03 MED ORDER — POTASSIUM CHLORIDE 10 MEQ/100ML IV SOLN
10.0000 meq | INTRAVENOUS | Status: AC
  Administered 2024-03-03 (×2): 10 meq via INTRAVENOUS
  Filled 2024-03-03: qty 100

## 2024-03-03 MED ORDER — BUPROPION HCL ER (XL) 150 MG PO TB24
150.0000 mg | ORAL_TABLET | Freq: Every morning | ORAL | Status: DC
  Administered 2024-03-03 – 2024-03-04 (×2): 150 mg via ORAL
  Filled 2024-03-03 (×2): qty 1

## 2024-03-03 MED ORDER — ASPIRIN 81 MG PO TBEC
81.0000 mg | DELAYED_RELEASE_TABLET | Freq: Every day | ORAL | Status: DC
  Administered 2024-03-03 – 2024-03-04 (×2): 81 mg via ORAL
  Filled 2024-03-03 (×2): qty 1

## 2024-03-03 MED ORDER — LACTATED RINGERS IV SOLN: INTRAVENOUS | Status: DC

## 2024-03-03 MED ORDER — INSULIN ASPART 100 UNIT/ML IJ SOLN: 5.0000 [IU] | Freq: Three times a day (TID) | INTRAMUSCULAR | Status: DC

## 2024-03-03 MED ORDER — INSULIN ASPART 100 UNIT/ML IJ SOLN
0.0000 [IU] | Freq: Every day | INTRAMUSCULAR | Status: DC
  Administered 2024-03-03: 2 [IU] via SUBCUTANEOUS
  Filled 2024-03-03: qty 2

## 2024-03-03 MED ORDER — INSULIN GLARGINE-YFGN 100 UNIT/ML ~~LOC~~ SOLN
5.0000 [IU] | Freq: Once | SUBCUTANEOUS | Status: AC
  Administered 2024-03-03: 5 [IU] via SUBCUTANEOUS
  Filled 2024-03-03: qty 0.05

## 2024-03-03 MED ORDER — INSULIN REGULAR(HUMAN) IN NACL 100-0.9 UT/100ML-% IV SOLN
INTRAVENOUS | Status: DC
  Administered 2024-03-03: 2.4 [IU]/h via INTRAVENOUS
  Administered 2024-03-03: 6 [IU]/h via INTRAVENOUS
  Administered 2024-03-03: 5.5 [IU]/h via INTRAVENOUS
  Filled 2024-03-03: qty 100

## 2024-03-03 MED ORDER — INSULIN GLARGINE-YFGN 100 UNIT/ML ~~LOC~~ SOLN
20.0000 [IU] | SUBCUTANEOUS | Status: DC
  Administered 2024-03-03 – 2024-03-04 (×2): 20 [IU] via SUBCUTANEOUS
  Filled 2024-03-03 (×2): qty 0.2

## 2024-03-03 MED ORDER — INSULIN GLARGINE-YFGN 100 UNIT/ML ~~LOC~~ SOLN
15.0000 [IU] | SUBCUTANEOUS | Status: DC
  Filled 2024-03-03: qty 0.15

## 2024-03-03 MED ORDER — METOCLOPRAMIDE HCL 10 MG PO TABS
20.0000 mg | ORAL_TABLET | Freq: Three times a day (TID) | ORAL | Status: DC
  Administered 2024-03-03 (×3): 20 mg via ORAL
  Filled 2024-03-03 (×4): qty 2

## 2024-03-03 MED ORDER — ENOXAPARIN SODIUM 40 MG/0.4ML IJ SOSY
40.0000 mg | PREFILLED_SYRINGE | INTRAMUSCULAR | Status: DC
  Administered 2024-03-03 – 2024-03-04 (×2): 40 mg via SUBCUTANEOUS
  Filled 2024-03-03 (×2): qty 0.4

## 2024-03-03 NOTE — ED Provider Notes (Signed)
 "  Emergency Department Provider Note   I have reviewed the triage vital signs and the nursing notes.   HISTORY  Chief Complaint Hyperglycemia   HPI Albert Brown is a 69 y.o. male with past history reviewed below including diabetes diagnosis last week started on metformin  presents to the emergency department with vomiting and labs from clinic today suggesting DKA.  Patient states that prior to his diagnosis he was having significant polydipsia and polyuria along with severe fatigue.  He was diagnosed with diabetes and started his metformin  but shortly afterwards began developing significant vomiting.  No shortness of breath or fevers.  No abdominal pain or chest discomfort.  He went back to the clinic today with difficulty keeping anything down and had lab work that came back later in the afternoon suggesting DKA.  He was called and advised to present to the emergency department.  He continues to have significant nausea but denies any pain currently.   Past Medical History:  Diagnosis Date   ALLERGIC RHINITIS    Allergic sinusitis    Anxiety state, unspecified    CAD S/P percutaneous coronary angioplasty 08/08/2013   mRCA  Xience Alpine DES 2.75 mm x 33 mm (3.0 mm)   Cerumen impaction, right    DEPRESSION    Diarrhea    Erectile dysfunction    EXTERNAL HEMORRHOIDS WITHOUT MENTION COMP    GERD    Hiatal hernia    HIV DISEASE    HIV infection (HCC)    HYPOGONADISM    Internal hemorrhoids    Non-ST elevated myocardial infarction (non-STEMI) (HCC) 08/08/2013   SubAcute100% RCA -- PCI; Echo: EF 55-605, Normal LV Size & Function - ~mild HK of Inferior Myocardium with mildly reduced RV function.  Normal Valve function   Periodontal disease    PERIPHERAL NEUROPATHY    PHARYNGITIS, RECURRENT    Pterygium of left eye    Tubular adenoma of colon    Urinary frequency    Urticaria    VERTIGO     Review of Systems  Constitutional: No fever/chills. Positive fatigue.   Cardiovascular: Denies chest pain. Respiratory: Denies shortness of breath. Gastrointestinal: No abdominal pain. Positive nausea and  vomiting.  No diarrhea.  No constipation. Genitourinary: Positive polyuria.  Musculoskeletal: Negative for back pain. Skin: Negative for rash. Neurological: Negative for headaches.  ____________________________________________   PHYSICAL EXAM:  VITAL SIGNS: ED Triage Vitals  Encounter Vitals Group     BP 03/02/24 1552 (!) 155/102     Pulse Rate 03/02/24 1552 100     Resp 03/02/24 1552 18     Temp 03/02/24 1552 97.8 F (36.6 C)     Temp Source 03/02/24 2147 Oral     SpO2 03/02/24 1552 95 %     Weight 03/02/24 1930 156 lb (70.8 kg)     Height 03/02/24 1930 5' 8 (1.727 m)   Constitutional: Alert and oriented. Well appearing and in no acute distress. Eyes: Conjunctivae are normal.  Head: Atraumatic. Nose: No congestion/rhinnorhea. Mouth/Throat: Mucous membranes are moist. Neck: No stridor.   Cardiovascular: Tachycardia. Good peripheral circulation. Grossly normal heart sounds.   Respiratory: Normal respiratory effort.  No retractions. Lungs CTAB. Gastrointestinal: Soft and nontender. No distention.  Musculoskeletal: No lower extremity tenderness nor edema. No gross deformities of extremities. Neurologic:  Normal speech and language. No gross focal neurologic deficits are appreciated.  Skin:  Skin is warm, dry and intact. No rash noted.  ____________________________________________   LABS (all labs ordered are  listed, but only abnormal results are displayed)  Labs Reviewed  COMPREHENSIVE METABOLIC PANEL WITH GFR - Abnormal; Notable for the following components:      Result Value   Sodium 131 (*)    Chloride 93 (*)    CO2 16 (*)    Glucose, Bld 275 (*)    BUN 27 (*)    Alkaline Phosphatase 131 (*)    Anion gap 22 (*)    All other components within normal limits  CBC WITH DIFFERENTIAL/PLATELET - Abnormal; Notable for the following  components:   Hemoglobin 18.0 (*)    RDW 11.3 (*)    Monocytes Absolute 1.1 (*)    All other components within normal limits  LIPASE, BLOOD - Abnormal; Notable for the following components:   Lipase 54 (*)    All other components within normal limits  URINALYSIS, ROUTINE W REFLEX MICROSCOPIC - Abnormal; Notable for the following components:   Specific Gravity, Urine 1.038 (*)    Glucose, UA >=500 (*)    Hgb urine dipstick SMALL (*)    Ketones, ur 80 (*)    Protein, ur 100 (*)    Bacteria, UA RARE (*)    All other components within normal limits  CBG MONITORING, ED - Abnormal; Notable for the following components:   Glucose-Capillary 268 (*)    All other components within normal limits  I-STAT VENOUS BLOOD GAS, ED - Abnormal; Notable for the following components:   pCO2, Ven 31.4 (*)    Bicarbonate 17.7 (*)    TCO2 19 (*)    Acid-base deficit 6.0 (*)    Sodium 130 (*)    HCT 53.0 (*)    Hemoglobin 18.0 (*)    All other components within normal limits  BASIC METABOLIC PANEL WITH GFR  BASIC METABOLIC PANEL WITH GFR  BASIC METABOLIC PANEL WITH GFR  BASIC METABOLIC PANEL WITH GFR  BASIC METABOLIC PANEL WITH GFR  BETA-HYDROXYBUTYRIC ACID  BETA-HYDROXYBUTYRIC ACID  BETA-HYDROXYBUTYRIC ACID  BETA-HYDROXYBUTYRIC ACID  BETA-HYDROXYBUTYRIC ACID  CBG MONITORING, ED  TROPONIN T, HIGH SENSITIVITY  TROPONIN T, HIGH SENSITIVITY   ____________________________________________  EKG   EKG Interpretation Date/Time:  Wednesday March 03 2024 02:29:51 EST Ventricular Rate:  81 PR Interval:  106 QRS Duration:  82 QT Interval:  352 QTC Calculation: 408 R Axis:   35  Text Interpretation: Sinus rhythm with short PR Nonspecific T wave abnormality Abnormal ECG When compared with ECG of 09-Feb-2024 15:12, PREVIOUS ECG IS PRESENT Similar to December 2025 tracing Confirmed by Darra Chew [phone]) on 03/03/2024 2:33:20 AM         ____________________________________________  RADIOLOGY  DG Chest 1 View Result Date: 03/02/2024 EXAM: 1 VIEW(S) XRAY OF THE CHEST 03/02/2024 08:07:00 PM COMPARISON: 02/13/2022. CLINICAL HISTORY: SOB (shortness of breath). FINDINGS: LUNGS AND PLEURA: No focal pulmonary opacity. No pleural effusion. No pneumothorax. HEART AND MEDIASTINUM: No acute abnormality of the cardiac and mediastinal silhouettes. BONES AND SOFT TISSUES: No acute osseous abnormality. IMPRESSION: 1. No active cardiopulmonary disease. Electronically signed by: Franky Crease MD 03/02/2024 08:15 PM EST RP Workstation: [id number]    ____________________________________________   PROCEDURES  Procedure(s) performed:   Procedures  CRITICAL CARE Performed by: Chew KANDICE Darra Total critical care time: 35 minutes Critical care time was exclusive of separately billable procedures and treating other patients. Critical care was necessary to treat or prevent imminent or life-threatening deterioration. Critical care was time spent personally by me on the following activities: development of treatment plan with patient and/or  surrogate as well as nursing, discussions with consultants, evaluation of patient's response to treatment, examination of patient, obtaining history from patient or surrogate, ordering and performing treatments and interventions, ordering and review of laboratory studies, ordering and review of radiographic studies, pulse oximetry and re-evaluation of patient's condition.  Fonda Law, MD Emergency Medicine  ____________________________________________   INITIAL IMPRESSION / ASSESSMENT AND PLAN / ED COURSE  Pertinent labs & imaging results that were available during my care of the patient were reviewed by me and considered in my medical decision making (see chart for details).   This patient is Presenting for Evaluation of vomiting and hyperglycemia, which does require a range of treatment options, and is  a complaint that involves a high risk of morbidity and mortality.  The Differential Diagnoses include DKA, symptomatic hyperglycemia, HHS, etc.  Critical Interventions-    Medications  sodium chloride  0.9 % bolus 1,000 mL (has no administration in time range)  metoCLOPramide  (REGLAN ) injection 10 mg (has no administration in time range)  lactated ringers  bolus 1,416 mL (has no administration in time range)  insulin  regular, human (MYXREDLIN ) 100 units/ 100 mL infusion (has no administration in time range)  lactated ringers  infusion (has no administration in time range)  dextrose  5 % in lactated ringers  infusion (has no administration in time range)  dextrose  50 % solution 0-50 mL (has no administration in time range)  potassium chloride  10 mEq in 100 mL IVPB (has no administration in time range)    Reassessment after intervention: nausea improved.   I decided to review pertinent External Data, and in summary IM Teaching Service clinic note reviewed.   Clinical Laboratory Tests Ordered, included CBC without cytosis or anemia.  Ketones in significant glucose on UA.  CBG 275 on his CMP with normal LFTs.  CO2 of 16 with gap of 22 consistent with DKA. VBG shows pH of 7.3.   Radiologic Tests Ordered, included CXR. I independently interpreted the images and agree with radiology interpretation.   Cardiac Monitor Tracing which shows NSR.    Social Determinants of Health Risk patient is not an active smoker.   Consult complete with IM Teaching Service. Plan for admit.   Medical Decision Making: Summary:  Patient presents emergency department with worsening hyperglycemia now with vomiting.  He has gap acidosis but pH is only mildly decreased.  Plan to start IV fluids along with IV insulin  and admit.   Patient's presentation is most consistent with acute presentation with potential threat to life or bodily function.   Disposition:  admit  ____________________________________________  FINAL CLINICAL IMPRESSION(S) / ED DIAGNOSES  Final diagnoses:  Diabetic ketoacidosis without coma associated with type 2 diabetes mellitus (HCC)    Note:  This document was prepared using Dragon voice recognition software and may include unintentional dictation errors.  Fonda Law, MD, [facility] Emergency Medicine    Freman Lapage, Fonda MATSU, MD 03/03/24 [phone]  "

## 2024-03-03 NOTE — Telephone Encounter (Signed)
 Pharmacy Patient Advocate Encounter  Insurance verification completed.    The patient is insured through [employer]. Patient has Medicare and is not eligible for a copay card, but may be able to apply for patient assistance or Medicare RX Payment Plan (Patient Must reach out to their plan, if eligible for payment plan), if available.    Ran test claim for Lantus  100unit Pen and the current 30 day co-pay is $22.73.  Ran test claim for Tresiba 100unit Pen and the current 30 day co-pay is $35.  This test claim was processed through [employer]- copay amounts may vary at other pharmacies due to [employer], or as the patient moves through the different stages of their insurance plan.

## 2024-03-03 NOTE — H&P (Signed)
 " Date: 03/03/2024               Patient Name:  Albert Brown MRN: [id number]  DOB: 02/11/1956 Age / Sex: 69 y.o., male   PCP: Renne Homans, MD         Medical Service: Internal Medicine Teaching Service         Attending Physician: Dr. Jone Dauphin      First Contact: Dr. Alfornia Light, DO    Second Contact: Dr. Toma Edwards, DO         Pager Information: First Contact Pager: [phone]   Second Contact Pager: [phone]   SUBJECTIVE   Chief Complaint: Fatigue, N/V, abnormal labs  History of Present Illness  Albert Brown is a 69 y.o. male with PMHx of newly diagnosed diabetes mellitus, CAD s/p PCI, NSTEMI, HIV on ART, Prostate CA, HTN, and HLD who was sent from [facility] appointment by Dr. Harrie due to concern for DKA. The patient has been having 3 weeks of increased urination, thirst, and general fatigue that worsened over the past 3 days and started experiencing severe fatigue with diffuse abdominal pain. Of note, the patient was recently diagnosed with diabetes mellitus with A1c on 9.7% on 02/24/24. The patient states that he was unaware of his diagnosis until he was notified to start taking Metformin  3 days ago. He notes that 3 days ago he went out for dinner with his family and ate a salad. That night he had trouble sleeping, waking up at around 3 AM with nausea/vomiting. He was unable to keep his Metformin  down the next morning and threw it up. He did note that he had waves of nausea before starting Metformin  so is unsure if the medication was related to his symptoms. Thinks it is possible he had some food poisoning. He denied diarrhea after initiating Metformin . Recently took it one day prior to arrival without issues. Associated symptoms include poor oral intake, difficulty sleeping. Also had productive cough but tested negative for Flu/Covid/RSV on 03/03/23 at PCP appt. The patient denies chest pain, SOB, hemoptysis, diarrhea, numbness/tingling, or HA. He also denies any recent sick  contacts.  The patient notes a personal history of thyroid disease when he was a teenager. He states he had hyperthyroidism and was given medication which was discontinued after a few years without any other problems. One of his brothers also has history of prostate cancer and diabetes but he is unsure the type or if he takes insulin . Reports that his great Aunt also had diabetes.   ED Course: Labs: Bicarb 16, AG 22, Glucose 275, K 4.2, Na 131; Lipase 54, Hgb 18; UA showing glucosuria, ketonuria, proteinuria, rare bacteria; I-STAT VBG showing pH 7.36 with pCO2 of 31.4, Bicarb 17.7 Imaging: CXR negative. Consults: None Management: NS 1L bolus, IV Reglan  10 mg, IV Insulin  via EndoTool  Meds  Albuterol  inhaler - patient not taking Aspirin  81 mg daily - patient only takes 1-2x/week because he forgets Bupropion  150 mg daily - taking Zyrtec  10 mg daily Coenzyme Q 10 Odefsey  200-25-25 mg daily - no missed doses Flonase  nasal spray Lisinopril  20 mg daily - recently stopped taking at prior PCP appt Metformin  500 mg daily Reglan  20 mg 3 times daily with meals - just started this Multivitamin Tamsulosin  0.4 mg daily - taking every other day  Allergies  Allergies as of 03/02/2024 - Review Complete 03/02/2024  Allergen Reaction Noted   Brilinta  [ticagrelor ] Shortness Of Breath 12/03/2013   Codeine Anaphylaxis  Pepcid [famotidine] Other (See Comments) 05/22/2022   Prilosec [omeprazole ] Other (See Comments) 05/22/2022   Bactrim [sulfamethoxazole-trimethoprim]  04/28/2013   Ciprofloxacin     Dapsone     Pravastatin  Other (See Comments) 01/17/2015   Sulfamethoxazole-trimethoprim  04/05/2010    Past Medical History Type 2 Diabetes (recently diagnosed) Allergic rhinitis CAD s/p DES to mRCA Depression Prostate cancer Erectile dysfunction HIV Hiatal hernia Hyperlipidemia NSTEMI Peripheral Neuropathy  Past Surgical History Past Surgical History:  Procedure Laterality Date   LEFT  HEART CATHETERIZATION WITH CORONARY ANGIOGRAM N/A 08/08/2013   Procedure: LEFT HEART CATHETERIZATION WITH CORONARY ANGIOGRAM;  Surgeon: Alm LELON Clay, MD;  Location: [facility] CATH LAB;  Service: Cardiovascular;  Laterality: N/A;   PERCUTANEOUS CORONARY STENT INTERVENTION (PCI-S)  08/08/2013   mRCA 100% --> Xience Alpine DES 2.75 mm x 33 mm --> 3.0 mm     Social History  Living Situation: Lives in a house alone in [place] Occupation: Production Designer, 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 [place]  Support: Family lives in town Level of Function: Independent, able to complete all ADLs and IADLs PCP: Renne Homans, MD  Substances: -Tobacco: former smoker from junior high until about 15 years ago; would smoke a few cigarettes up to multiple packs/day -Alcohol: social drinker about 1-2 drinks per week -Recreational Drug: smokes marijuana daily  Family History  Family History  Problem Relation Age of Onset   Cancer Mother        brain tumor   Prostate cancer Father    Osteosarcoma Father    Alcohol abuse Father    Prostate cancer Brother    Diabetes Brother    Colon cancer Neg Hx    Esophageal cancer Neg Hx    Liver cancer Neg Hx    Rectal cancer Neg Hx    Stomach cancer Neg Hx      Review of Systems  A complete ROS was negative except as per HPI.   OBJECTIVE:   Physical Exam: Blood pressure (!) 163/93, pulse 83, temperature 98 F (36.7 C), temperature source Oral, resp. rate 18, height 5' 8 (1.727 m), weight 70.8 kg, SpO2 100%.  Constitutional: Well-appearing male in no acute distress, lying in hospital bed HENT: normocephalic atraumatic, dry MM without overt oral lesions Eyes: conjunctiva non-erythematous, PERRL, no scleral icterus Cardiovascular: slightly tachcyardic with regular rhythm, no m/r/g Pulmonary/Chest: normal work of breathing on room air, lungs clear to auscultation bilaterally Abdominal: soft, non-tender, non-distended, bowel sounds normal Neurological: alert & oriented x3, moving  all extremities equally Skin: warm and dry, decreased skin turgor Extremities: no edema or cyanosis; peripheral pulses intact Psych: normal mood and affect, thought content normal  Labs: CBC    Component Value Date/Time   WBC 9.7 03/02/2024 1931   RBC 5.59 03/02/2024 1931   HGB 18.0 (H) 03/02/2024 1947   HGB 15.6 06/11/2021 1424   HCT 53.0 (H) 03/02/2024 1947   HCT 47.9 06/11/2021 1424   PLT 212 03/02/2024 1931   PLT 168 06/11/2021 1424   MCV 92.5 03/02/2024 1931   MCV 96 06/11/2021 1424   MCH 32.2 03/02/2024 1931   MCHC 34.8 03/02/2024 1931   RDW 11.3 (L) 03/02/2024 1931   RDW 12.7 06/11/2021 1424   LYMPHSABS 2.2 03/02/2024 1931   LYMPHSABS 1.3 06/11/2021 1424   MONOABS 1.1 (H) 03/02/2024 1931   EOSABS 0.2 03/02/2024 1931   EOSABS 0.1 06/11/2021 1424   BASOSABS 0.0 03/02/2024 1931   BASOSABS 0.0 06/11/2021 1424     CMP  Component Value Date/Time   NA 130 (L) 03/03/2024 0301   NA 136 02/24/2024 1644   K 5.1 03/03/2024 0301   CL 93 (L) 03/03/2024 0301   CO2 17 (L) 03/03/2024 0301   GLUCOSE 256 (H) 03/03/2024 0301   BUN 28 (H) 03/03/2024 0301   BUN 16 02/24/2024 1644   CREATININE 1.18 03/03/2024 0301   CREATININE 1.06 11/19/2023 1416   CALCIUM  9.6 03/03/2024 0301   PROT 7.5 03/02/2024 1931   PROT 7.3 04/04/2021 1517   ALBUMIN 4.7 03/02/2024 1931   ALBUMIN 4.9 (H) 04/04/2021 1517   AST 24 03/02/2024 1931   ALT 34 03/02/2024 1931   ALKPHOS 131 (H) 03/02/2024 1931   BILITOT 0.6 03/02/2024 1931   BILITOT 0.4 04/04/2021 1517   GFRNONAA >60 03/03/2024 0301   GFRNONAA 61 07/31/2020 0244   GFRAA 71 07/31/2020 0244    Imaging: DG Chest 1 View Result Date: 03/02/2024 EXAM: 1 VIEW(S) XRAY OF THE CHEST 03/02/2024 08:07:00 PM COMPARISON: 02/13/2022. CLINICAL HISTORY: SOB (shortness of breath). FINDINGS: LUNGS AND PLEURA: No focal pulmonary opacity. No pleural effusion. No pneumothorax. HEART AND MEDIASTINUM: No acute abnormality of the cardiac and mediastinal  silhouettes. BONES AND SOFT TISSUES: No acute osseous abnormality. IMPRESSION: 1. No active cardiopulmonary disease. Electronically signed by: Franky Crease MD 03/02/2024 08:15 PM EST RP Workstation: [id number]    EKG: personally reviewed my interpretation is NSR with TWI in lead III, V1; artifact present - consistent w/ EKG obtained in Dec 2025.  ASSESSMENT & PLAN:   Assessment & Plan by Problem: Principal Problem:   DKA (diabetic ketoacidosis) (HCC)   Albert Brown is a male living with a history of newly diagnosed T2DM, CAD s/p PCI, NSTEMI, HIV, Prostate CA, HTN, and HLD  who presented with fatigue and nausea/vomiting and was admitted for diabetic ketoacidosis on hospital day 0.  #Diabetic Ketoacidosis #Newly Diagnosed Diabetes Mellitus Patient with new diagnosis of diabetes mellitus at age 14 now presenting with severe fatigue and diffuse abdominal pain. Recently started metformin  3 days ago but experienced GI upset (N/V). Unclear if related to metformin  itself. Last A1c of 9.7% on 02/24/24. Labs at [facility] appointment earlier today showed blood sugar of 292, ketonuria, bicarb of 13 with elevated AG of 24. Beta hydroxybutyrate elevated at 6.76 from outpatient labs. Outpatient notes reporting new diagnosis of type 2 diabetes mellitus, however older age with normal BMI raises suspicion for LADA or secondary cause. Recent CT A/P imaging from November 2025 without abnormality other than known prostate findings, though. The patient does have HIV and is on anti-retrovirals which have been shown to contribute to insulin  resistance. Regardless, will screen for LADA with auto-antibodies including anti-GAD, ZNT8, and IA-2. If he does have LADA, may want to avoid sulfonylureas. Unclear inciting event of DKA, though new diagnosis of diabetes with inadequate anti-hyperglycemic regimen may be contributing. Will continue IV insulin  via EndoTool started by EDP and also administer 10 units of long-acting insulin  to  expedite transition to SQ insulin . Will likely need insulin  therapy upon discharge. Given active insulin  infusion will closely monitor K and replete as needed. - IV Insulin  via EndoTool - Semglee  15 units - LR infusion, D5LR if CBG < 250 - BMP and ?-hydroxybutyrate q4h - GAD, ZNT8, IA-2 antibodies pending - TSH pending (reported hx hyperthyroidism in childhood; TSH nl 2 years ago but undetectable 4 years ago) - Diabetes educator consulted, appreciate recs - Continue home PO Reglan  20 mg tid - NPO, sips with meds - Continuous  cardiac monitoring  #CAD s/p DES mRCA #NSTEMI Follows with Dr. Jeffrie with [facility]. History of CAD s/p DES to mRCA on 08/08/2013. Endorses abdominal pain but no active chest pain. EKG without concerning ST/T wave abnormalities. Troponin negative x2. Low concern for cardiac process at this time. Will place on telemetry d/t concerns for electrolyte shifts with insulin  treatment for DKA. Continuing home ASA which patient reports poor adherence to. LDL in September 2025 of 80 with goal < 55 given history of CAD and new diabetes mellitus. Patient has not been taking Crestor  due to concerns for fatigue. Should be on high-intensity statin therapy given cardiac history and will need outpatient follow up for this. - Continue home ASA 81 mg daily - Continuous cardiac monitoring  #Hyponatremia Na of 130 but increases to 132 when corrected for hyperglycemia. Mild hyponatremia likely in the setting of hypovolemia. Will closely monitor while patient receives fluids. - IVF as above - Trend BMPs  #Polycythemia Hgb of 18. Volume down on exam. Likely relative polycythemia in the setting of dehydration from excessive GI loss and poor oral intake d/t DKA. Suspect that this will improve with fluids and treatment for DKA. Will continue to monitor. - IVF as above - Trend CBCs   #HIV Follows with Dr. Fleeta Rothman, last appt October 2025. Currently taking Odefsey . Last viral load undetectable  with CD4 of 647. Remains afebrile, no leukocytosis. Does have productive cough but CXR negative for acute cardiopulmonary process. No concerns for infectious process at this time. Will continue home HIV medication. - Continue home Odefsey   #Hypertension Initial BP in the ED of 140-150s/90-100s. Lisinopril  20 mg daily was recently stopped due to concerns for potassium. Previously he had reported good adherence to this medication. Will continue monitoring BP but no urgent indication for antihypertensive therapy at this time.  #Prostate Cancer Follows with Dr. Shane though stating he may want a second opinion. Had CT A/P and NM bone scan in November 2025 which showed prostatomegaly without other evidence. Was taking Flomax  every other day. Plans for TURP in the future but patient would like to explore radiation therapy options. No acute concerns, will continue home meds. - Continue home Flomax  0.4 mg daily   Chronic Stable Medical Conditions  #Depression - Continue home Wellbutrin  XL 150 mg daily #HLD - No acute concerns, will need outpatient f/u for lipid medication as not taking Crestor  #Allergic rhinitis - Continue home Flonase     Best practice: Diet: NPO VTE: Enoxaparin  IVF: LR or D5LR with EndoTool parameters,125cc/hr Code: Full  Disposition planning: Prior to Admission Living Arrangement: Home Anticipated Discharge Location: Home  Dispo: Admit patient to Observation with expected length of stay less than 2 midnights.  Signed: Janavia Rottman, MD [facility]  PGY-1 03/03/2024, 4:05 AM  Please contact IM Residency On-Call Pager at: [phone] or [phone].  "

## 2024-03-03 NOTE — Inpatient Diabetes Management (Signed)
 Inpatient Diabetes Program Recommendations  AACE/ADA: New Consensus Statement on Inpatient Glycemic Control (2015)  Target Ranges:  Prepandial:   less than 140 mg/dL      Peak postprandial:   less than 180 mg/dL (1-2 hours)      Critically ill patients:  140 - 180 mg/dL   Lab Results  Component Value Date   GLUCAP 124 (H) 03/03/2024   HGBA1C 9.7 (H) 02/24/2024    Review of Glycemic Control  Diabetes history: New Diagnosis Diabetes Type 2 Outpatient Diabetes medications: Metformin  500 mg Daily started outpatient this past Saturday by pt Current orders for Inpatient glycemic control:  Semglee  20 units Q24 hours Novolog  0-15 units tid + hs  Lantus  copay is $22.73, [place] copay is $35  Glucose meter kit  Insulin  pen needles  Spoke with patient about new diabetes diagnosis.  Discussed A1C results (9.7%) and explained what an A1C is. Discussed basic pathophysiology of DM Type 2, basic home care, importance of checking CBGs and maintaining good CBG control to prevent long-term and short-term complications. Reviewed glucose and A1C goals. Reviewed signs and symptoms of hyperglycemia and hypoglycemia along with treatment for both. Discussed impact of nutrition, exercise, stress, sickness, and medications on diabetes control. Informed patient that he will be prescribed insulin  at time of discharge. Discussed insulin  in detail (how to take it, when to take it). Asked patient to check his glucose 4 times per day (before meals and at bedtime) and to keep a log book of glucose readings and insulin  taken. Explained how the doctor he follows up with can use the log book to continue to make insulin  adjustments if needed.   Educated patient on insulin  pen use at home. Reviewed contents of insulin  flexpen starter kit. Reviewed all steps of insulin  pen including attachment of needle, 2-unit air shot, dialing up dose, giving injection, removing needle, disposal of sharps, storage of unused insulin , disposal  of insulin  etc. Patient able to provide successful return demonstration. Also reviewed troubleshooting with insulin  pen. MD to give patient Rxs for insulin  pens and insulin  pen needles.   Patient verbalized understanding of information discussed and he states that he has no further questions at this time related to diabetes. RNs to provide ongoing basic DM education at bedside with this patient and engage patient to actively check blood glucose and administer insulin  injections.   Thanks, Clotilda Bull RN, MSN, BC-ADM Inpatient Diabetes Coordinator Team Pager [phone] ([id number])

## 2024-03-03 NOTE — Progress Notes (Signed)
 "  HD#0 SUBJECTIVE:  Patient Summary: Albert Brown is a 69 y.o. with a pertinent PMH of  T2DM, CAD s/p PCI, NSTEMI, HIV, Prostate CA, HTN, and HLD, who presented with fatigue, n/v and admitted for DKA.   Overnight Events: placed on EndoTool, received 15 mg basal total, LR now on D5LR   Interim History: Patient was assessed this morning in the ED hallway stretcher. Patient is sleeping comfortable, with no complaints other than the light shining above his head is bothering him and he still feels fatigued. Patient denies n/v, chest pain, SOB or abdominal pain. No other concerns at this time, eager to get home to his cats.   OBJECTIVE:  Vital Signs: Vitals:   03/02/24 2147 03/03/24 0227 03/03/24 0602 03/03/24 0653  BP: (!) 163/95 (!) 163/93 (!) 147/84   Pulse: 94 83 74   Resp: 19 18 (!) 23   Temp: 98.7 F (37.1 C) 98 F (36.7 C)  98 F (36.7 C)  TempSrc: Oral Oral  Oral  SpO2: 99% 100% 100%   Weight:      Height:       Supplemental O2: Room Air SpO2: 100 %  Filed Weights   03/02/24 1930  Weight: 70.8 kg     Intake/Output Summary (Last 24 hours) at 03/03/2024 9176 Last data filed at 03/03/2024 9344 Gross per 24 hour  Intake 425.87 ml  Output --  Net 425.87 ml   Net IO Since Admission: 425.87 mL [03/03/24 0823]  Physical Exam: Physical Exam Constitutional:      General: He is not in acute distress.    Appearance: He is not ill-appearing, toxic-appearing or diaphoretic.  Cardiovascular:     Rate and Rhythm: Normal rate and regular rhythm.  Pulmonary:     Effort: Pulmonary effort is normal.     Breath sounds: Normal breath sounds.  Abdominal:     General: There is no distension.     Palpations: Abdomen is soft.     Tenderness: There is no abdominal tenderness.  Musculoskeletal:     Right lower leg: No edema.     Left lower leg: No edema.  Skin:    General: Skin is dry.  Neurological:     General: No focal deficit present.     Mental Status: He is alert.     Patient Lines/Drains/Airways Status     Active Line/Drains/Airways     Name Placement date Placement time Site Days   Peripheral IV 03/03/24 20 G Left Antecubital 03/03/24  0301  Antecubital  less than 1   Peripheral IV 03/03/24 22 G Anterior;Right Forearm 03/03/24  0405  Forearm  less than 1           Pertinent labs and imaging:  ?-hydroxybutyrate 3.43 K+ 4.1 AG 16    Latest Ref Rng & Units 03/02/2024    7:47 PM 03/02/2024    7:31 PM 03/02/2024   10:46 AM  CBC  WBC 4.0 - 10.5 K/uL  9.7  8.7   Hemoglobin 13.0 - 17.0 g/dL 81.9  81.9  82.2   Hematocrit 39.0 - 52.0 % 53.0  51.7  51.4   Platelets 150 - 400 K/uL  212  199        Latest Ref Rng & Units 03/03/2024    6:45 AM 03/03/2024    3:01 AM 03/02/2024    7:47 PM  CMP  Glucose 70 - 99 mg/dL 818  743    BUN 8 - 23 mg/dL 24  28    Creatinine 0.61 - 1.24 mg/dL 9.02  8.81    Sodium 864 - 145 mmol/L 132  130  130   Potassium 3.5 - 5.1 mmol/L 4.1  5.1  4.2   Chloride 98 - 111 mmol/L 99  93    CO2 22 - 32 mmol/L 17  17    Calcium  8.9 - 10.3 mg/dL 8.9  9.6      DG Chest 1 View Result Date: 03/02/2024 EXAM: 1 VIEW(S) XRAY OF THE CHEST 03/02/2024 08:07:00 PM COMPARISON: 02/13/2022. CLINICAL HISTORY: SOB (shortness of breath). FINDINGS: LUNGS AND PLEURA: No focal pulmonary opacity. No pleural effusion. No pneumothorax. HEART AND MEDIASTINUM: No acute abnormality of the cardiac and mediastinal silhouettes. BONES AND SOFT TISSUES: No acute osseous abnormality. IMPRESSION: 1. No active cardiopulmonary disease. Electronically signed by: Franky Crease MD 03/02/2024 08:15 PM EST RP Workstation: [id number]    ASSESSMENT/PLAN:  Assessment: Principal Problem:   DKA (diabetic ketoacidosis) (HCC) Active Problems:   Human immunodeficiency virus (HIV) disease (HCC)   NSTEMI (non-ST elevated myocardial infarction) (HCC)   CAD S/P percutaneous coronary angioplasty - Xience outline DES to mid RCA (2.75 mm x 33 mm --> 3.0 mm)   Essential  hypertension  Albert Brown is a male living with a history of newly diagnosed T2DM, CAD s/p PCI, NSTEMI, HIV, Prostate CA, HTN, and HLD  who presented with fatigue and nausea/vomiting and was admitted for diabetic ketoacidosis on hospital day 0.  Plan: #Diabetic Ketoacidosis #Newly Diagnosed Diabetes Mellitus New dx of DMII, however older age with normal BMI raises suspicion for LADA or secondary cause. The patient does have HIV and is on anti-retrovirals which have been shown to contribute to insulin  resistance. Regardless, will screen for LADA with auto-antibodies including anti-GAD, ZNT8, and IA-2. If he does have LADA, may want to avoid sulfonylureas. Unclear inciting event of DKA, though new diagnosis of diabetes with inadequate anti-hyperglycemic regimen may be contributing. Will continue IV insulin  via EndoTool started by EDP and monitor BMPs, plan to transition patient from endotool to subcutaneous insulin  upon resolution of DKA. Will need insulin  therapy upon discharge. Given active insulin  infusion will closely monitor K and replete (if <3) as needed. - IV Insulin  via EndoTool - D5LR if CBG < 250 - BMP and ?-hydroxybutyrate q4h - GAD, ZNT8, IA-2 antibodies pending - TSH WNL - Diabetes educator consulted, appreciate recs - Continue home PO Reglan  20 mg tid - NPO, sips with meds - Continuous cardiac monitoring as able   #CAD s/p DES mRCA #NSTEMI Follows with Dr. Jeffrie with [facility]. History of CAD s/p DES to mRCA on 08/08/2013. Endorses abdominal pain but no active chest pain. EKG without concerning ST/T wave abnormalities. Troponin negative x2. Low concern for cardiac process at this time. Will place on telemetry d/t concerns for electrolyte shifts with insulin  treatment for DKA. Continuing home ASA which patient reports poor adherence to. LDL in September 2025 of 80 with goal < 55 given history of CAD and new diabetes mellitus. Patient has not been taking Crestor  due to concerns for  fatigue. Should be on high-intensity statin therapy given cardiac history and will need outpatient follow up for this. - Continue home ASA 81 mg daily - Continuous cardiac monitoring as able   #Hyponatremia Na of 130 but increases to 132 when corrected for hyperglycemia. Mild hyponatremia likely in the setting of hypovolemia. Will closely monitor while patient receives fluids, recheck today at 133. - IVF as above - Trend BMPs   #  Polycythemia Hgb of 18. Volume depleated on exam. Likely relative polycythemia in the setting of dehydration from excessive GI loss and poor oral intake d/t DKA. Suspect that this will improve with fluids and treatment for DKA. Will continue to monitor. - IVF as above - Trend CBCs    #HIV Follows with Dr. Fleeta Rothman, last appt October 2025. Currently taking Odefsey . Last viral load undetectable with CD4 of 647. Remains afebrile, no leukocytosis. Does have productive cough but CXR negative for acute cardiopulmonary process. No concerns for infectious process at this time. Will continue home HIV medication. - Continue home Odefsey    #Hypertension Initial BP in the ED of 140-150s/90-100s. Lisinopril  20 mg daily was recently stopped due to concerns for potassium. Previously he had reported good adherence to this medication. Will continue monitoring BP but no urgent indication for antihypertensive therapy at this time, Bps today 140s-150s/80s.   #Prostate Cancer Follows with Dr. Shane though stating he may want a second opinion. Had CT A/P and NM bone scan in November 2025 which showed prostatomegaly without other evidence. Was taking Flomax  every other day. Plans for TURP in the future but patient would like to explore radiation therapy options. No acute concerns, will continue home meds. - Continue home Flomax  0.4 mg daily   Chronic Stable Medical Conditions   #Depression - Continue home Wellbutrin  XL 150 mg daily #HLD - No acute concerns, will need outpatient f/u  for lipid medication as not taking Crestor  #Allergic rhinitis - Continue home Flonase   Best Practice: Diet: NPO IVF: Fluids: D5-LR, endotool parameters 125cc/hr VTE: enoxaparin  (LOVENOX ) injection 40 mg Start: 03/03/24 1000 Code: Full  Disposition planning: Therapy Recs: None, DME: none Family Contact: brother Sherwood, to be notified. DISPO: Anticipated discharge tomorrow to Home pending medication management.  Signature: Alfornia Light, DO [facility]  8:23 AM, 03/03/2024  On Call pager [phone]  "

## 2024-03-03 NOTE — Discharge Instructions (Addendum)
 Thank you for allowing us  to be part of your care. You were hospitalized for DKA. We treated you with IFV, insulin , and electrolyte repleation.  See the changes in your medications and management of your chronic conditions below:  *For your DMII -We have STARTED you on these following medications:  -long acting insulin , short acting with meals & correctional coverage  FOLLOW UP APPOINTMENTS: We arranged for you to follow up with your family doctor at:  -Please see Dr. Libby Blanch, Albert Brown on 03/11/24 @ 2:15p -Please see [name] Prost on 03/11/24 @ 1:15p  Please call your PCP or our clinic if you have any questions or concerns, we may be able to help and keep you from a long and expensive emergency room wait. Our clinic and after hours phone number is [phone]. The best time to call is Monday through Friday 9 am to 4 pm but there is always someone available 24/7 if you have an emergency. If you need medication refills please notify your pharmacy one week in advance and they will send us  a request.   We are glad you are feeling better,  Albert Light, Albert Brown Internal Medicine Inpatient Teaching Service at [facility]

## 2024-03-03 NOTE — ED Notes (Signed)
 CCMD called.

## 2024-03-03 NOTE — Progress Notes (Signed)
" ° °  Brief Progress Note   _____________________________________________________________________________________________________________  Patient Name: Albert Brown Patient DOB: 1955/11/06 Date: @TODAY @      Data: 69 yo male currently awaiting admission to a Telemetry bed at [facility].    Action: Reached out to Dr. Heddy to inform the need for a new admission order be placed for Telemetry instead of a change in level of care so it appears correctly for patient placement.    Response:  Dr. Heddy will update patients admission orders to reflect Telemetry status.  _____________________________________________________________________________________________________________  [facility] RN Expeditor [place] Thielman S [place] Nick Please contact us  directly via secure chat (search for [facility]) or by calling us  at [phone] [facility]).  "

## 2024-03-04 ENCOUNTER — Other Ambulatory Visit (HOSPITAL_COMMUNITY): Payer: Self-pay

## 2024-03-04 ENCOUNTER — Ambulatory Visit: Payer: Self-pay | Admitting: Student

## 2024-03-04 LAB — CBG MONITORING, ED
Glucose-Capillary: 169 mg/dL — ABNORMAL HIGH (ref 70–99)
Glucose-Capillary: 283 mg/dL — ABNORMAL HIGH (ref 70–99)
Glucose-Capillary: 236 mg/dL — ABNORMAL HIGH (ref 70–99)

## 2024-03-04 LAB — CBC
HCT: 44.2 % (ref 39.0–52.0)
Hemoglobin: 15.5 g/dL (ref 13.0–17.0)
MCH: 32.2 pg (ref 26.0–34.0)
MCHC: 35.1 g/dL (ref 30.0–36.0)
MCV: 91.7 fL (ref 80.0–100.0)
Platelets: 147 K/uL — ABNORMAL LOW (ref 150–400)
RBC: 4.82 MIL/uL (ref 4.22–5.81)
RDW: 11.1 % — ABNORMAL LOW (ref 11.5–15.5)
WBC: 6.8 K/uL (ref 4.0–10.5)
nRBC: 0 % (ref 0.0–0.2)

## 2024-03-04 LAB — BASIC METABOLIC PANEL WITH GFR
Anion gap: 7 (ref 5–15)
BUN: 22 mg/dL (ref 8–23)
CO2: 27 mmol/L (ref 22–32)
Calcium: 8.9 mg/dL (ref 8.9–10.3)
Chloride: 104 mmol/L (ref 98–111)
Creatinine, Ser: 1 mg/dL (ref 0.61–1.24)
GFR, Estimated: >60 mL/min
Glucose, Bld: 125 mg/dL — ABNORMAL HIGH (ref 70–99)
Potassium: 3.7 mmol/L (ref 3.5–5.1)
Sodium: 137 mmol/L (ref 135–145)

## 2024-03-04 MED ORDER — INSULIN PEN NEEDLE 32G X 4 MM MISC
1.0000 | 0 refills | Status: AC
  Filled 2024-03-04: qty 100, 25d supply, fill #0

## 2024-03-04 MED ORDER — BLOOD GLUCOSE TEST VI STRP
1.0000 | ORAL_STRIP | 0 refills | Status: DC
  Filled 2024-03-04: qty 100, 25d supply, fill #0

## 2024-03-04 MED ORDER — INSULIN ASPART 100 UNIT/ML FLEXPEN
0.0000 [IU] | PEN_INJECTOR | Freq: Three times a day (TID) | SUBCUTANEOUS | 0 refills | Status: AC
  Filled 2024-03-04: qty 15, 83d supply, fill #0

## 2024-03-04 MED ORDER — ACCU-CHEK SOFTCLIX LANCETS MISC
1.0000 | 0 refills | Status: DC
  Filled 2024-03-04: qty 100, 25d supply, fill #0

## 2024-03-04 MED ORDER — BLOOD GLUCOSE MONITOR SYSTEM W/DEVICE KIT
1.0000 | PACK | 0 refills | Status: AC
  Filled 2024-03-04: qty 1, 30d supply, fill #0

## 2024-03-04 MED ORDER — INSULIN GLARGINE 100 UNIT/ML SOLOSTAR PEN
20.0000 [IU] | PEN_INJECTOR | Freq: Every day | SUBCUTANEOUS | 0 refills | Status: DC
  Filled 2024-03-04: qty 15, 75d supply, fill #0

## 2024-03-04 MED ORDER — FLUTICASONE PROPIONATE 50 MCG/ACT NA SUSP
1.0000 | Freq: Every day | NASAL | 0 refills | Status: AC
  Filled 2024-03-04: qty 16, 60d supply, fill #0

## 2024-03-04 MED ORDER — LANCET DEVICE MISC
1.0000 | 0 refills | Status: AC
  Filled 2024-03-04: qty 1, fill #0

## 2024-03-04 NOTE — ED Notes (Signed)
 Transition pharmacy called about patients medications. Per pharmacy they are behind but they will bring the medications as soon as they can.

## 2024-03-04 NOTE — Progress Notes (Signed)
" ° °  Brief Progress Note   _____________________________________________________________________________________________________________  Patient Name: Albert Brown Patient DOB: December 05, 1955 Date: @TODAY @      Data: 69 yo male currently awaiting admission to a Progressive bed at [facility].    Action: Reached out to Dr. Idelle regarding the potential downgrade of the patient to a med-surg bed.    Response:  Per Dr. Idelle pt will be discharged today.  _____________________________________________________________________________________________________________  [facility] RN Expeditor Laniesha Das S Lillien Petronio Please contact us  directly via secure chat (search for [facility]) or by calling us  at [phone] [facility]).  "

## 2024-03-04 NOTE — ED Notes (Addendum)
 Pt had questions about when to use his short acting insulin  and long acting insulin . Pt states he only eats 2 times a day. Pt asking if he still needs to take his insulin  3 times a day. Diabetic nurse paged. Spoke with the Diabetic nurse on speaker phone while in the patients room. Recommendations per diabetic nurse to only use the short-acting insulin  at meal times. If the patient eats only twice a day then only take the short-acting twice a day. Pt had concerns about if he should still take his long-acting insulin  even if he doesn't eat dinner. Per the diabetic nurse patient should still take his long-acting insulin  before bedtime and check his blood sugar in the morning after waking up. Instructions for patient to notify his PCP if his morning blood sugar is less than 70. Pt verbalized understanding and has no more questions for this nurse or the diabetic nurse. Patient given instructions to ask PCP in his appointment next week about taking insulin  when he is sick. Pt verbalized understanding.

## 2024-03-04 NOTE — Discharge Summary (Addendum)
 "  Name: Albert Brown MRN: [id number] DOB: 03/27/1955 69 y.o. PCP: Renne Homans, MD  Date of Admission: 03/02/2024  7:06 PM Date of Discharge: 03/04/24 Attending Physician: Dr. Jone Dauphin  Discharge Diagnosis: 1. Principal Problem:   DKA (diabetic ketoacidosis) (HCC) Active Problems:   Human immunodeficiency virus (HIV) disease (HCC)   NSTEMI (non-ST elevated myocardial infarction) (HCC)   CAD S/P percutaneous coronary angioplasty - Xience outline DES to mid RCA (2.75 mm x 33 mm --> 3.0 mm)   Essential hypertension  Discharge Medications: Allergies as of 03/04/2024       Reactions   Brilinta  [ticagrelor ] Shortness Of Breath   BREATHING ISSUES   Codeine Anaphylaxis   Pepcid [famotidine] Other (See Comments)   He is on Odefsey    Prilosec [omeprazole ] Other (See Comments)   He is on Odefsey    Bactrim [sulfamethoxazole-trimethoprim]    Pt stated he died when taking this   Ciprofloxacin    REACTION: rash   Dapsone    REACTION: severe-anaphylaxis   Pravastatin  Other (See Comments)   Muscle cramps   Sulfamethoxazole-trimethoprim    Anaphylaxis        Medication List     PAUSE taking these medications    lisinopril  20 MG tablet Wait to take this until your doctor or other care provider tells you to start again. Commonly known as: ZESTRIL  TAKE 1 TABLET(20 MG) BY MOUTH DAILY       TAKE these medications    albuterol  108 (90 Base) MCG/ACT inhaler Commonly known as: VENTOLIN  HFA Inhale 2 puffs into the lungs every 6 (six) hours as needed for wheezing or shortness of breath.   aspirin  EC 81 MG tablet Take 1 tablet (81 mg total) by mouth daily.   Blood Glucose Monitoring Suppl Devi 1 each by Does not apply route as directed. Dispense based on patient and insurance preference. Use up to four times daily as directed. (FOR ICD-10 E10.9, E11.9).   BLOOD GLUCOSE TEST STRIPS Strp 1 each by Does not apply route as directed. Dispense based on patient and insurance  preference. Use up to four times daily as directed. (FOR ICD-10 E10.9, E11.9).   buPROPion  150 MG 24 hr tablet Commonly known as: Wellbutrin  XL Take 1 tablet (150 mg total) by mouth every morning.   fluticasone  50 MCG/ACT nasal spray Commonly known as: FLONASE  Place 1 spray into both nostrils daily.   insulin  aspart 100 UNIT/ML FlexPen Commonly known as: NOVOLOG  Inject 0-6 Units into the skin 3 (three) times daily with meals. Check Blood Glucose (BG) and inject per scale: BG <150= 0 unit; BG 150-200= 1 unit; BG 201-250= 2 unit; BG 251-300= 3 unit; BG 301-350= 4 unit; BG 351-400= 5 unit; BG >400= 6 unit and Call Primary Care.   insulin  glargine 100 UNIT/ML Solostar Pen Commonly known as: LANTUS  Inject 20 Units into the skin daily. May substitute as needed per insurance.   Lancet Device Misc 1 each by Does not apply route as directed. Dispense based on patient and insurance preference. Use up to four times daily as directed. (FOR ICD-10 E10.9, E11.9).   Lancets Misc 1 each by Does not apply route as directed. Dispense based on patient and insurance preference. Use up to four times daily as directed. (FOR ICD-10 E10.9, E11.9).   metFORMIN  500 MG tablet Commonly known as: GLUCOPHAGE  Take 1 tablet (500 mg total) by mouth daily with breakfast. Continue 1 tablet daily for at least 7 days. If tolerating, increase to twice a  day for 1 week. If tolerating increase 2 tablets in AM and 1 tablet in PM. If tolerating increase to 2 tablets twice a day.   metoCLOPramide  10 MG tablet Commonly known as: REGLAN  Take 2 tablets (20 mg total) by mouth 3 (three) times daily with meals.   MULTI VITAMIN MENS PO Take 1 tablet by mouth daily.   NON FORMULARY Has had aleve, alka seltzer   Odefsey  200-25-25 MG Tabs tablet Generic drug: emtricitabine -rilpivir-tenofovir  AF Take 1 tablet by mouth daily with breakfast.   Pen Needles 31G X 5 MM Misc 1 each by Does not apply route as directed. Dispense based  on patient and insurance preference. Use up to four times daily as directed. (FOR ICD-10 E10.9, E11.9).   tamsulosin  0.4 MG Caps capsule Commonly known as: Flomax  Take 1 capsule (0.4 mg total) by mouth daily. What changed: when to take this       Disposition and follow-up:   Albert Brown was discharged from [facility] in Good condition.  At the hospital follow up visit please address:  1.  Follow up with patient regarding treatment of his HLD, has been non-compliant with Crestor   2.  Labs / imaging needed at time of follow-up: n/a  3.  Pending labs/ test needing follow-up:  auto-antibodies for LADA; anti-GAD, ZNT8, and IA-2   Thank you for allowing us  to be part of your care. You were hospitalized for DKA. We treated you with IFV, insulin , and electrolyte repleation.  See the changes in your medications and management of your chronic conditions below:  *For your DMII -We have STARTED you on these following medications:  -long acting insulin , short acting with meals & correctional coverage  FOLLOW UP APPOINTMENTS: We arranged for you to follow up with your family doctor at:  -Please see Dr. Libby Blanch, DO on 03/11/24 @ 2:15p -Please see [name] Prost on 03/11/24 @ 1:15p  Please call your PCP or our clinic if you have any questions or concerns, we may be able to help and keep you from a long and expensive emergency room wait. Our clinic and after hours phone number is [phone]. The best time to call is Monday through Friday 9 am to 4 pm but there is always someone available 24/7 if you have an emergency. If you need medication refills please notify your pharmacy one week in advance and they will send us  a request.   We are glad you are feeling better,  Alfornia Light, DO Internal Medicine Inpatient Teaching Service at [facility]   Follow-up Appointments:  Follow-up Information     Blanch Libby, DO. Go on 03/11/2024.   Specialty: Internal Medicine Why: @  2:15p Contact information: [address], [address] [place] [address] [phone]         [name], [name] HERO, RD. Go on 03/11/2024.   Specialty: Dietician Why: @ 1:15p Contact information: [address] [place] [address] [phone]                Hospital Course by problem list: Albert Brown is a 69 y.o. person living with a history of newly diagnosed T2DM, CAD s/p PCI, NSTEMI, HIV, Prostate CA, HTN, and HLD who presented with fatigue and nausea/vomiting and was admitted for diabetic ketoacidosis, now being discharged on hospital day 1 with the following pertinent hospital course:  #Diabetic Ketoacidosis #Newly Diagnosed T2DM Patient presenting with severe malaise, diffuse abdominal pain, and a few weeks of polyuria, fatigue,  increased thirst. Last A1c of 9.7% on 02/24/24. Labs at [facility] appointment earlier today showed blood sugar of 292, ketonuria, bicarb of 13 with elevated AG of 24. Beta hydroxybutyrate increased to 6.76 from outpatient labs. Started patient on insulin  EndoTool, while monitoring CBGs hourly and BMP, beta-hydroxybutyrate and K+ monitoring every 4 hours. There was concern for LADA given this patients appropriate BMI and late age onset of DM, so screened patient for auto-antibodies including anti-GAD, ZNT8, and IA-2 which have not resulted as of yet.  Patient was transitioned off of Endotool while still receiving D5 LR and continuing CBG monitoring.  Patient received 20 units subcu long-acting insulin  with 5 units for mealtime coverage, and CBGs and BMPs continued to improve.  Anion gap improved to 7 by day of discharge, and diabetes counselor educated patient on insulin  use and diabetes.  #CAD s/p DES mRCA #NSTEMI History of CAD s/p DES to [facility] on 08/08/2013. Endorsed abdominal pain but no active chest pain. EKG was without concerning abnormalities, troponin negative. Placed on telemetry for concerns for electrolyte shifts with insulin  treatment  for DKA and continued home ASA.  #HIV Follows with Dr. Fleeta Rothman, last appt October 2025. Currently taking Odefsey . Last viral load undetectable with CD4 of 647. No acute concerns for infectious process, resumed home Odefsey .  #Hypertension Initial BP in the ED of 140-150s/90-100s. Recently stopped taking Lisinopril  20 mg daily due to concerns of potassium depletion. Held home antihypertensive and monitored pressures.  #Polycythemia Likely in the setting of dehydration from excessive GI loss d/t DKA, CBC improved greatly with correction of his DKA.   Stable chronic medical conditions: Depression HLD  Subjective Patient is reporting much improved lethargy and nausea since yesterday.  Discussed with patient improvement in labs since his admission and readiness to discharge home.  Patient is excited to be reunited with his cats.  Patient was able to discuss with me the education that he was provided yesterday from the diabetes coordinator, and able to teach back insulin  use.  Patient has follow-ups with PCP and registered dietitian scheduled, no other concerns at this time.   Discharge Exam:   BP (!) 142/82   Pulse 81   Temp 98.2 F (36.8 C) (Oral)   Resp 12   Ht 5' 8 (1.727 m)   Wt 70.8 kg   SpO2 95%   BMI 23.72 kg/m  Discharge exam:  Constitutional:      General: He is not in acute distress.    Appearance: He is not ill-appearing, toxic-appearing or diaphoretic.  Cardiovascular:     Rate and Rhythm: Normal rate and regular rhythm.  Pulmonary:     Effort: Pulmonary effort is normal.     Breath sounds: Normal breath sounds.  Abdominal:     General: There is no distension.     Palpations: Abdomen is soft.     Tenderness: There is no abdominal tenderness.  Musculoskeletal:     Right lower leg: No edema.     Left lower leg: No edema.  Skin:    General: Skin is dry.  Neurological:     General: No focal deficit present.     Mental Status: He is alert, orientedx3  Pertinent  Labs, Studies, and Procedures:     Latest Ref Rng & Units 03/04/2024    4:11 AM 03/02/2024    7:47 PM 03/02/2024    7:31 PM  CBC  WBC 4.0 - 10.5 K/uL 6.8   9.7   Hemoglobin 13.0 - 17.0 g/dL  15.5  18.0  18.0   Hematocrit 39.0 - 52.0 % 44.2  53.0  51.7   Platelets 150 - 400 K/uL 147   212        Latest Ref Rng & Units 03/04/2024    4:11 AM 03/03/2024    3:16 PM 03/03/2024   11:30 AM  CMP  Glucose 70 - 99 mg/dL 874  603  880   BUN 8 - 23 mg/dL 22  19  21    Creatinine 0.61 - 1.24 mg/dL 8.99  9.02  9.05   Sodium 135 - 145 mmol/L 137  134  133   Potassium 3.5 - 5.1 mmol/L 3.7  3.8  3.6   Chloride 98 - 111 mmol/L 104  100  101   CO2 22 - 32 mmol/L 27  22  23    Calcium  8.9 - 10.3 mg/dL 8.9  9.3  8.9    DG Chest 1 View Result Date: 03/02/2024 EXAM: 1 VIEW(S) XRAY OF THE CHEST 03/02/2024 08:07:00 PM COMPARISON: 02/13/2022. CLINICAL HISTORY: SOB (shortness of breath). FINDINGS: LUNGS AND PLEURA: No focal pulmonary opacity. No pleural effusion. No pneumothorax. HEART AND MEDIASTINUM: No acute abnormality of the cardiac and mediastinal silhouettes. BONES AND SOFT TISSUES: No acute osseous abnormality. IMPRESSION: 1. No active cardiopulmonary disease. Electronically signed by: Franky Crease MD 03/02/2024 08:15 PM EST RP Workstation: [id number]   Signed: Idelle Nakai, DO 03/04/2024, 11:05 AM   "

## 2024-03-04 NOTE — ED Notes (Signed)
 Pt ambulated to restroom without asst.

## 2024-03-09 LAB — GLUTAMIC ACID DECARBOXYLASE AUTO ABS: Glutamic Acid Decarb Ab: 5539.7 U/mL — ABNORMAL HIGH (ref 0.0–5.0)

## 2024-03-11 ENCOUNTER — Encounter: Payer: Self-pay | Admitting: Student

## 2024-03-11 ENCOUNTER — Ambulatory Visit: Payer: Self-pay | Admitting: Student

## 2024-03-11 ENCOUNTER — Other Ambulatory Visit: Payer: Self-pay

## 2024-03-11 ENCOUNTER — Ambulatory Visit: Payer: Self-pay | Admitting: Dietician

## 2024-03-11 VITALS — BP 135/84 | HR 69 | Temp 98.0°F | Ht 68.0 in | Wt 163.4 lb

## 2024-03-11 DIAGNOSIS — E139 Other specified diabetes mellitus without complications: Secondary | ICD-10-CM

## 2024-03-11 DIAGNOSIS — E119 Type 2 diabetes mellitus without complications: Secondary | ICD-10-CM

## 2024-03-11 DIAGNOSIS — C61 Malignant neoplasm of prostate: Secondary | ICD-10-CM | POA: Diagnosis not present

## 2024-03-11 MED ORDER — ATORVASTATIN CALCIUM 10 MG PO TABS: 10.0000 mg | ORAL_TABLET | Freq: Every day | ORAL | 11 refills | Status: AC

## 2024-03-11 NOTE — Assessment & Plan Note (Signed)
 Patient has been followed for elevated PSA.  Had biopsies on 12/04/2023 that were consistent with prostatic adenocarcinoma.  Patient had 7/12 biopsies that were positive for prostatic adenocarcinoma.  1/12 was suspicious for carcinoma.  4/12 of the biopsies were negative.  Patient follows with urology.  They have recommended prostatectomy.  Patient is weighing his options at this time.  I encouraged him to reach out to his urologist.  Plan:  - Patient encouraged to follow-up with urology

## 2024-03-11 NOTE — Progress Notes (Signed)
 Diabetes Self-Management Education  Visit Type: First/Initial  Appt. Start Time: 345 Appt. End Time: 418  03/11/2024  [name], identified by name and date of birth, is a 69 y.o. male with a diagnosis of Diabetes: Type 2.   ASSESSMENT   He reports weight loss 30#~ 1.5 years. Brother with diabetes on CGM. They get together on weekends to eat and drink beer. He states 2 beers at most due to job.  Estimated body mass index is 24.84 kg/m as calculated from the following:   Height as of an earlier encounter on 03/11/24: 5' 8 (1.727 m).   Weight as of an earlier encounter on 03/11/24: 163 lb 6.4 oz (74.1 kg). Wt Readings from Last 10 Encounters:  03/11/24 163 lb 6.4 oz (74.1 kg)  03/02/24 156 lb (70.8 kg)  03/02/24 156 lb 6.4 oz (70.9 kg)  02/24/24 165 lb 6.4 oz (75 kg)  02/09/24 174 lb 14.4 oz (79.3 kg)  12/03/23 170 lb (77.1 kg)  10/08/23 170 lb 9.6 oz (77.4 kg)  08/25/23 174 lb (78.9 kg)  06/24/23 179 lb (81.2 kg)  10/15/22 184 lb (83.5 kg)   Lab Results  Component Value Date   HGBA1C 9.7 (H) 02/24/2024   HGBA1C 5.4 07/14/2019    Today reports blood sugars at home have been: 110/100/106/135/185/117/91/89/   Diabetes Self-Management Education - 03/11/24 1600       Visit Information   Visit Type First/Initial      Initial Visit   Diabetes Type Type 2    Date Diagnosed 02/2024    Are you currently following a meal plan? Yes    What type of meal plan do you follow? plate method with limited carbs    Are you taking your medications as prescribed? Yes      Health Coping   How would you rate your overall health? Good      Psychosocial Assessment   Patient Belief/Attitude about Diabetes Motivated to manage diabetes    What is the hardest part about your diabetes right now, causing you the most concern, or is the most worrisome to you about your diabetes?   --   defer to later visit   Self-care barriers None    Self-management support Friends;Family    Other  persons present --   dietetic intern   Patient Concerns Nutrition/Meal planning;Monitoring    Special Needs None    Preferred Learning Style No preference indicated    Learning Readiness Ready    How often do you need to have someone help you when you read instructions, pamphlets, or other written materials from your doctor or pharmacy? 2 - Rarely    What is the last grade level you completed in school? 12      Pre-Education Assessment   Patient understands the diabetes disease and treatment process. Comprehends key points    Patient understands incorporating nutritional management into lifestyle. Needs Instruction    Patient undertands incorporating physical activity into lifestyle. Comprehends key points    Patient understands using medications safely. Needs Review    Patient understands monitoring blood glucose, interpreting and using results Needs Review    Patient understands prevention, detection, and treatment of acute complications. Needs Review    Patient understands prevention, detection, and treatment of chronic complications. --   need to assess at follow up   Patient understands how to develop strategies to address psychosocial issues. --   need to assess at follow up   Patient understands how  to develop strategies to promote health/change behavior. --   need to assess at follow up     Complications   Last HgB A1C per patient/outside source 14 %    How often do you check your blood sugar? 3-4 times/day    Fasting Blood glucose range (mg/dL) 869-820;819-799    Postprandial Blood glucose range (mg/dL) --   unknown   Number of hypoglycemic episodes per month 0    Number of hyperglycemic episodes ( >200mg /dL): Rare    Can you tell when your blood sugar is high? --   need to assess at follow up   Have you had a dilated eye exam in the past 12 months? No    Have you had a dental exam in the past 12 months? --   need to assess at follow up   Are you checking your feet? No       Dietary Intake   Breakfast oatmeal, peaches in  uice, boiled egg,    Lunch pasta, sauce, steamed veggies, cottage cheese    Dinner chicken pasta dish, cup of milk    Beverage(s) likes sweet tea and has been drinkg it with sucralose in it      Activity / Exercise   Activity / Exercise Type ADL's;Light (walking / raking leaves)    How many days per week do you exercise? 5    How many minutes per day do you exercise? 60    Total minutes per week of exercise 300      Patient Education   Previous Diabetes Education No   in the hospital   Healthy Eating Food label reading, portion sizes and measuring food.;Role of diet in the treatment of diabetes and the relationship between the three main macronutrients and blood glucose level;Effects of alcohol on blood glucose and safety factors with consumption of alcohol.    Medications Reviewed patients medication for diabetes, action, purpose, timing of dose and side effects.   discussed rotating injection sites   Monitoring --   he wants to use CGM     Individualized Goals (developed by patient)   Nutrition Other (comment)   practice plate method of meal planning     Post-Education Assessment   Patient understands incorporating nutritional management into lifestyle. Comprehends key points    Patient understands using medications safely. Comphrehends key points      Outcomes   Expected Outcomes Demonstrated interest in learning. Expect positive outcomes    Future DMSE 2 wks    Program Status Not Completed          Individualized Plan for Diabetes Self-Management Training:   Learning Objective:  Patient will have a greater understanding of diabetes self-management. Patient education plan is to attend individual and/or group sessions per assessed needs and concerns.   Plan:   There are no Patient Instructions on file for this visit.  Expected Outcomes:  Demonstrated interest in learning. Expect positive outcomes  Education material  provided: Diabetes Resources  If problems or questions, patient to contact team via:  Phone and Email  Future DSME appointment: 2 wks [name] Hole, RD 03/11/2024 4:34 PM.

## 2024-03-11 NOTE — Assessment & Plan Note (Signed)
 Patient presents for hospital follow-up regarding diabetic ketoacidosis.  Patient was recently hospitalized for this requiring insulin  infusion.  Patient had A1c of 9.7 on 02/24/2024.  Antibodies were drawn to evaluate for latent autoimmune diabetes in adults.  Patient was found to have elevated glutamic acid decarboxylase autoantibodies.  IA 2 autoantibodies and ZNT 8 antibodies processing still.  Patient evaluated today.  He states he is doing well.  He has had no lows.  Over the past week his fasting blood sugars have been ranging from 90s-130s.  His mealtime sugars have been in the 130s.  He did have some elevations into the 190s, but has not required much mealtime insulin .  Discussed this with him today.  He states he is doing well and has no concerns.  Given elevated antibodies, will refer patient to endocrinology.  Will also have [name] evaluate patient.  Plan: - As patient not using much of mealtime insulin , can stop this - Continue checking sugars regularly - Continue Lantus  20 dose daily - Follow-up with [name] - Urine ACR pending - Diabetic retinal exam pending - Patient referred to endocrinology

## 2024-03-11 NOTE — Patient Instructions (Signed)
 Follow up on 2-4 weeks [name] Hole, RD [phone]

## 2024-03-11 NOTE — Patient Instructions (Addendum)
 Albert Brown, Thank you for allowing me to take part in your care today.  Here are your instructions.  1.  Regarding your diabetes, continue taking your Lantus  20 units daily.  You can stop taking your mealtime insulin .  Continue checking sugars regularly.  I am checking your urine today, I will call you with results.  2.  Please follow-up with [name]. She will do your eye exam.  3.  I have restarted a statin for you.  If you develop any muscle cramps or weakness or fatigue, please call the clinic and we will have to stop this.  4.  Please come back in 3 months for diabetes follow-up, if you do establish with a endocrinologist, you do not have to come to this appointment.  5.  Please talk with your urologist about your prostate cancer options   PLEASE BRING YOUR MEDICATIONS TO EVERY APPOINTMENT  Thank you, Dr. Tobie  If you have any other questions please contact the internal medicine clinic at [phone] If it is after hours, please call the [facility] at [phone] and then ask the person who picks up for the resident on call.

## 2024-03-11 NOTE — Progress Notes (Signed)
 "  CC: Hospital follow-up  HPI:  Albert Brown is a 69 y.o. male with a past medical history of CAD, latent autoimmune diabetes, HIV, hyperlipidemia who presents for hospital follow-up regarding diabetic ketoacidosis episode.  Please see assessment and plan for full HPI.  Medications: Anxiety: Bupropion  150 mg daily CAD: Aspirin  81 mg daily Diabetes: Metformin  500 mg daily, Lantus  20 units daily HIV: Odefsey  1 tablet daily Allergic rhinitis: Flonase  1 spray daily BPH: Flomax  0.4 mg daily Hypertension, lisinopril  20 mg daily (on pause)  Past Medical History:  Diagnosis Date   ALLERGIC RHINITIS    Allergic sinusitis    Anxiety state, unspecified    CAD S/P percutaneous coronary angioplasty 08/08/2013   mRCA  Xience Alpine DES 2.75 mm x 33 mm (3.0 mm)   Cerumen impaction, right    DEPRESSION    Diarrhea    Erectile dysfunction    EXTERNAL HEMORRHOIDS WITHOUT MENTION COMP    GERD    Hiatal hernia    HIV DISEASE    HIV infection (HCC)    HYPOGONADISM    Internal hemorrhoids    Non-ST elevated myocardial infarction (non-STEMI) (HCC) 08/08/2013   SubAcute100% RCA -- PCI; Echo: EF 55-605, Normal LV Size & Function - ~mild HK of Inferior Myocardium with mildly reduced RV function.  Normal Valve function   Periodontal disease    PERIPHERAL NEUROPATHY    PHARYNGITIS, RECURRENT    Pterygium of left eye    Tubular adenoma of colon    Urinary frequency    Urticaria    VERTIGO     Current Medications[1]  Review of Systems:    Negative except for what is stated in HPI  Physical Exam:  Vitals:   03/11/24 1445  BP: 135/84  Pulse: 69  Temp: 98 F (36.7 C)  TempSrc: Oral  Weight: 163 lb 6.4 oz (74.1 kg)  Height: 5' 8 (1.727 m)    General: Patient is sitting comfortably in the room  Head: Normocephalic, atraumatic  Cardio: Regular rate and rhythm, no murmurs, rubs or gallop Pulmonary: Clear to ausculation bilaterally with no rales, rhonchi, and crackles     Assessment & Plan:   Assessment & Plan Latent autoimmune diabetes in adults (LADA), managed as type 1 [facility]) Patient presents for hospital follow-up regarding diabetic ketoacidosis.  Patient was recently hospitalized for this requiring insulin  infusion.  Patient had A1c of 9.7 on 02/24/2024.  Antibodies were drawn to evaluate for latent autoimmune diabetes in adults.  Patient was found to have elevated glutamic acid decarboxylase autoantibodies.  IA 2 autoantibodies and ZNT 8 antibodies processing still.  Patient evaluated today.  He states he is doing well.  He has had no lows.  Over the past week his fasting blood sugars have been ranging from 90s-130s.  His mealtime sugars have been in the 130s.  He did have some elevations into the 190s, but has not required much mealtime insulin .  Discussed this with him today.  He states he is doing well and has no concerns.  Given elevated antibodies, will refer patient to endocrinology.  Will also have [name] evaluate patient.  Plan: - As patient not using much of mealtime insulin , can stop this - Continue checking sugars regularly - Continue Lantus  20 dose daily - Follow-up with [name] - Urine ACR pending - Diabetic retinal exam pending - Patient referred to endocrinology Adenocarcinoma of prostate [facility]) Patient has been followed for elevated PSA.  Had biopsies on 12/04/2023 that were consistent with  prostatic adenocarcinoma.  Patient had 7/12 biopsies that were positive for prostatic adenocarcinoma.  1/12 was suspicious for carcinoma.  4/12 of the biopsies were negative.  Patient follows with urology.  They have recommended prostatectomy.  Patient is weighing his options at this time.  I encouraged him to reach out to his urologist.  Plan:  - Patient encouraged to follow-up with urology    Patient discussed with Dr. Shawn Libby Blanch, DO Internal Medicine Resident PGY-3     [1]  Current Outpatient Medications:    atorvastatin  (LIPITOR) 10 MG  tablet, Take 1 tablet (10 mg total) by mouth daily., Disp: 30 tablet, Rfl: 11   Accu-Chek Softclix Lancets lancets, Use up to 4 times a day as directed, Disp: 100 each, Rfl: 0   albuterol  (VENTOLIN  HFA) 108 (90 Base) MCG/ACT inhaler, Inhale 2 puffs into the lungs every 6 (six) hours as needed for wheezing or shortness of breath. (Patient not taking: Reported on 03/03/2024), Disp: 18 g, Rfl: 0   aspirin  EC 81 MG tablet, Take 1 tablet (81 mg total) by mouth daily., Disp: 90 tablet, Rfl: 3   Blood Glucose Monitoring Suppl (BLOOD GLUCOSE MONITOR SYSTEM) w/Device KIT, Use up to four times a day as directed., Disp: 1 kit, Rfl: 0   buPROPion  (WELLBUTRIN  XL) 150 MG 24 hr tablet, Take 1 tablet (150 mg total) by mouth every morning., Disp: 30 tablet, Rfl: 2   emtricitabine -rilpivir-tenofovir  AF (ODEFSEY ) 200-25-25 MG TABS tablet, Take 1 tablet by mouth daily with breakfast., Disp: 30 tablet, Rfl: 11   fluticasone  (FLONASE ) 50 MCG/ACT nasal spray, Place 1 spray into both nostrils daily., Disp: 16 g, Rfl: 0   Glucose Blood (BLOOD GLUCOSE TEST STRIPS) STRP, Test with 1 strip up to 4 times a day as directed, Disp: 100 each, Rfl: 0   insulin  aspart (NOVOLOG ) 100 UNIT/ML FlexPen, Inject 0-6 Units into the skin 3 (three) times daily with meals. Check Blood Glucose (BG) and inject per scale: BG <150= 0 unit; BG 150-200= 1 unit; BG 201-250= 2 unit; BG 251-300= 3 unit; BG 301-350= 4 unit; BG 351-400= 5 unit; BG >400= 6 unit and Call Primary Care., Disp: 15 mL, Rfl: 0   insulin  glargine (LANTUS ) 100 UNIT/ML Solostar Pen, Inject 20 Units into the skin daily., Disp: 15 mL, Rfl: 0   Insulin  Pen Needle 32G X 4 MM MISC, Use as directed up to 4 times a day., Disp: 100 each, Rfl: 0   Lancet Device MISC, 1 each by Does not apply route as directed. Dispense based on patient and insurance preference. Use up to four times daily as directed. (FOR ICD-10 E10.9, E11.9)., Disp: 1 each, Rfl: 0   [Paused] lisinopril  (ZESTRIL ) 20 MG tablet,  TAKE 1 TABLET(20 MG) BY MOUTH DAILY, Disp: 90 tablet, Rfl: 1   metFORMIN  (GLUCOPHAGE ) 500 MG tablet, Take 1 tablet (500 mg total) by mouth daily with breakfast. Continue 1 tablet daily for at least 7 days. If tolerating, increase to twice a day for 1 week. If tolerating increase 2 tablets in AM and 1 tablet in PM. If tolerating increase to 2 tablets twice a day., Disp: 90 tablet, Rfl: 3   metoCLOPramide  (REGLAN ) 10 MG tablet, Take 2 tablets (20 mg total) by mouth 3 (three) times daily with meals. (Patient not taking: Reported on 03/03/2024), Disp: 90 tablet, Rfl: 1   Multiple Vitamin (MULTI VITAMIN MENS PO), Take 1 tablet by mouth daily., Disp: , Rfl:    NON FORMULARY, Has had  aleve, alka seltzer, Disp: , Rfl:    tamsulosin  (FLOMAX ) 0.4 MG CAPS capsule, Take 1 capsule (0.4 mg total) by mouth daily. (Patient taking differently: Take 0.4 mg by mouth every other day.), Disp: 90 capsule, Rfl: 3  "

## 2024-03-12 ENCOUNTER — Ambulatory Visit: Payer: Self-pay | Admitting: Student

## 2024-03-12 LAB — IA-2 AUTOANTIBODIES: IA-2 Autoantibodies: 19 U/mL — ABNORMAL HIGH

## 2024-03-12 LAB — MICROALBUMIN / CREATININE URINE RATIO
Creatinine, Urine: 222.5 mg/dL
Microalb/Creat Ratio: 8 mg/g{creat} (ref 0–29)
Microalbumin, Urine: 16.9 ug/mL

## 2024-03-13 LAB — ZNT8 ANTIBODIES: ZNT8 Antibodies: 66 U/mL — ABNORMAL HIGH

## 2024-03-16 NOTE — Progress Notes (Signed)
 Internal Medicine Clinic Attending  Case discussed with the resident at the time of the visit.  We reviewed the resident's history and exam and pertinent patient test results.  I agree with the assessment, diagnosis, and plan of care documented in the resident's note.

## 2024-03-22 ENCOUNTER — Telehealth: Payer: Self-pay | Admitting: Pharmacist

## 2024-03-22 ENCOUNTER — Ambulatory Visit: Admitting: Pharmacist

## 2024-03-22 NOTE — Telephone Encounter (Signed)
 I spoke with patient. He didn't realize he had apt with me today. Says his MD recently put him on atorvastatin . He had fatigue with rosuvastatin . He is doing fine with atorvastatin  so far. He says that he was taken off of lisinopril  due to elevated K. He hasn't been checking BP at home but has machine. Recently dx with DM. Spent 3 days in hospital with DKA.  He will check BP at home and I will see him 3/12 in the office.

## 2024-03-30 ENCOUNTER — Telehealth: Payer: Self-pay | Admitting: *Deleted

## 2024-03-30 ENCOUNTER — Telehealth: Payer: Self-pay | Admitting: Student

## 2024-03-30 NOTE — Telephone Encounter (Signed)
 Please review message below.  Attempted to contact patient, but no answer.  Was able to leave detailed message to call the office back and asked to be transferred directly to one of the reps in house.    Copied from CRM [id number]. Topic: Appointments - Scheduling Inquiry for Clinic >> Mar 24, 2024  9:45 AM [name] wrote: Reason for CRM: [id number]: Mr. [name] is needing to schedule an appt with Ms. [name], he stated someone can either call him and leave a message on his vm or call him after 2:30 when he's off from work. Please call to advise >> Mar 30, 2024 11:28 AM Marda G wrote: Patient Albert Brown is calling back to request an appt with [name] Prost  Please advise

## 2024-03-30 NOTE — Telephone Encounter (Unsigned)
 Copied from CRM #[id number]. Topic: Clinical - Medication Refill >> Mar 30, 2024 11:17 AM Carrielelia G wrote: Medication:   MetFORMIN  (GLUCOPHAGE ) 500 MG tablet Glucose Blood (BLOOD GLUCOSE TEST STRIPS) STRP Accu-Chek Softclix Lancets lancets    * Patient needs clarification on how to take medication Metformin  directions ae not clear on the bottle and spoke with pharmacist and they were unclear also.   Has the patient contacted their pharmacy? Yes (Agent: If no, request that the patient contact the pharmacy for the refill. If patient does not wish to contact the pharmacy document the reason why and proceed with request.) (Agent: If yes, when and what did the pharmacy advise?)  This is the patient's preferred pharmacy:  [id number] [address], [place] - [address] AT [facility] [address] [place] [address] Phone: [phone] Fax: [phone]  Is this the correct pharmacy for this prescription? Yes If no, delete pharmacy and type the correct one.    Is the patient out of the medication? No but two pills left   Has the patient been seen for an appointment in the last year OR does the patient have an upcoming appointment? Yes  Can we respond through MyChart? No  Agent: Please be advised that Rx refills may take up to 3 business days. We ask that you follow-up with your pharmacy.

## 2024-03-31 ENCOUNTER — Other Ambulatory Visit: Payer: Self-pay | Admitting: Dietician

## 2024-03-31 ENCOUNTER — Ambulatory Visit: Payer: Self-pay | Admitting: Dietician

## 2024-03-31 ENCOUNTER — Other Ambulatory Visit: Payer: Self-pay | Admitting: Student

## 2024-03-31 VITALS — Wt 167.4 lb

## 2024-03-31 DIAGNOSIS — E139 Other specified diabetes mellitus without complications: Secondary | ICD-10-CM

## 2024-03-31 DIAGNOSIS — E119 Type 2 diabetes mellitus without complications: Secondary | ICD-10-CM

## 2024-03-31 MED ORDER — METFORMIN HCL 1000 MG PO TABS: 1000.0000 mg | ORAL_TABLET | Freq: Two times a day (BID) | ORAL | 3 refills | Status: AC

## 2024-03-31 MED ORDER — INSULIN GLARGINE 100 UNIT/ML SOLOSTAR PEN: 20.0000 [IU] | PEN_INJECTOR | Freq: Every day | SUBCUTANEOUS | 0 refills | Status: AC

## 2024-03-31 MED ORDER — DEXCOM G7 RECEIVER DEVI: 0 refills | Status: AC

## 2024-03-31 MED ORDER — ACCU-CHEK SOFTCLIX LANCETS MISC: 5 refills | Status: AC

## 2024-03-31 MED ORDER — DEXCOM G7 15 DAY SENSOR MISC: 3 refills | Status: AC

## 2024-03-31 MED ORDER — ACCU-CHEK GUIDE TEST VI STRP: ORAL_STRIP | 12 refills | Status: AC

## 2024-03-31 NOTE — Patient Instructions (Addendum)
 What we talked about:  LADA- Latent autoimmune diabetes in adults A1c- average blood sugar Or sugar coating on your red blood cells- M&Ms? Target blood sugars- 80-130 before meals After meals less than 180 mg/dL  Checking feet- daily Eyes-I'll ask for a referral and we can do the retinal camera as well  Food- how to fit in your favorite and control your blood sugars.  Will request Lantus  insulin , testing supplies and  Dexcom G7 15 day Continuous glucose monitoring (CGM) be sent to [facility]  Follow up in 1 month.  Albert Brown [phone]

## 2024-03-31 NOTE — Progress Notes (Signed)
 Medical Nutrition Therapy initial visit (Diabetes Management) Start time:230 PM  End time: 355 PM Total time:85 minutes Albert Brown is a 69 y.o. male who presents to clinic for diabetes management with  registered dietitian. Referred by Dr. Zheng for newly diagnosed diabetes. ASSESSMENT: History of Current Diagnoses/Problems:  Patient Active Problem List   Diagnosis Date Noted   DKA (diabetic ketoacidosis) (HCC) 03/03/2024   Latent autoimmune diabetes in adults (LADA), managed as type 1 (HCC) 02/28/2024   Synovial cyst of left popliteal space 10/08/2023   Cervical myofascial strain 08/26/2023   Adenocarcinoma of prostate (HCC) 06/25/2023   Right lower quadrant abdominal pain 04/04/2021   Difficulty urinating 03/30/2021   Low back pain 03/30/2021   Diverticulitis 09/07/2020   Fatigue 07/23/2019   Encounter for screening colonoscopy 08/24/2018   Chronic urticaria 08/11/2018   Dyspnea on exertion 08/01/2017   Coronary artery disease involving native coronary artery of native heart with angina pectoris 08/01/2017   Essential hypertension 09/27/2016   Hepatitis B immune 07/31/2016   Hyperlipidemia LDL goal <70 12/05/2013   Dyslipidemia 08/10/2013   NSTEMI (non-ST elevated myocardial infarction) (HCC) 08/08/2013    Class: Hospitalized for   CAD S/P percutaneous coronary angioplasty - Xience outline DES to mid RCA (2.75 mm x 33 mm --> 3.0 mm) 08/08/2013    Class: History of   Presence of drug coated stent in right coronary artery 08/08/2013   Erectile dysfunction 01/18/2013   Pterygium eye, left 08/03/2012   Periodontal disease 08/21/2010   Allergic sinusitis 08/21/2010   Hypotestosteronism 09/20/2009   PHARYNGITIS, RECURRENT 10/06/2008   External hemorrhoids 09/15/2008   Human immunodeficiency virus (HIV) disease (HCC) 08/04/2006   Anxiety and depression 08/04/2006   Hereditary and idiopathic peripheral neuropathy 08/04/2006   Allergic rhinitis 08/04/2006   GERD 08/04/2006    VERTIGO 08/04/2006   Note anibodies x3 are positive for autoimmune diabetes. His brother has diabetes as well Current Medications: Lantus  insulin  20 units daily, metformin  1000 mg twice daily Vitamins/Supplements: deferred Nutrition-related Lab Values:  Cholesterol, Total  Date Value Ref Range Status  04/01/2019 126 100 - 199 mg/dL Final   Cholesterol  Date Value Ref Range Status  11/19/2023 160 <200 mg/dL Final   HDL  Date Value Ref Range Status  11/19/2023 49 > OR = 40 mg/dL Final  97/95/7978 44 >60 mg/dL Final   Total CHOL/HDL Ratio  Date Value Ref Range Status  11/19/2023 3.3 <5.0 (calc) Final   VLDL  Date Value Ref Range Status  05/09/2016 29 <30 mg/dL Final   LDL Cholesterol (Calc)  Date Value Ref Range Status  11/19/2023 80 mg/dL (calc) Final    Comment:    Reference range: <100 . Desirable range <100 mg/dL for primary prevention;   <70 mg/dL for patients with CHD or diabetic patients  with > or = 2 CHD risk factors. SABRA LDL-C is now calculated using the Martin-Hopkins  calculation, which is a validated novel method providing  better accuracy than the Friedewald equation in the  estimation of LDL-C.  Gladis APPLETHWAITE et al. SANDREA. 7986;689(80): 2061-2068  (http://education.[facility].com/faq/FAQ164)    Hemoglobin A1C  Date Value Ref Range Status  07/14/2019 5.4 4.0 - 5.6 % Final   Hgb A1c MFr Bld  Date Value Ref Range Status  02/24/2024 9.7 (H) 4.8 - 5.6 % Final    Comment:             Prediabetes: 5.7 - 6.4          Diabetes: >  6.4          Glycemic control for adults with diabetes: <7.0    Not available fro review: vitamins D3 and B12  BP Readings from Last 3 Encounters:  03/11/24 135/84  03/04/24 (!) 156/114  03/02/24 (!) 133/96   BG monitoring:  3-4 times daily- is interested in Continuous glucose monitoring But his phone is not compatible with either brand.  High- 152 Low 64 no symptoms Average- unknown- he did not being his meter  DM  health maintenance:  Last dental exam: unknown  Last eye exam: request referral  Home foot exams: daily  Medication-related issues: would like metformin  in 1000 mg tablet/pill  Tobacco use: no Weight History & Anthropometrics: states he was overweight before diabetes, usual ~170# Wt Readings from Last 6 Encounters:  03/11/24 163 lb 6.4 oz (74.1 kg)  03/02/24 156 lb (70.8 kg)  03/02/24 156 lb 6.4 oz (70.9 kg)  02/24/24 165 lb 6.4 oz (75 kg)  02/09/24 174 lb 14.4 oz (79.3 kg)  12/03/23 170 lb (77.1 kg)   BMI Readings from Last 1 Encounters:  03/11/24 24.84 kg/m   Social: Housing: no problems Employment: working and about to retire Barriers: none noted- upcoming prostate cancer surgery Food Security: none noted  Shopping & meal prep:he does his own [employer] ([employer]) Health:appropriate Disordered Eating: none evident Sleep:need to assess Physical Activity:active at work Special diet/food allergies: limits sugar and carbs Dietary Patterns & Changes: buying sugar free foods, juices etc Intake - Typical Day Recall:   Breakfast: oatmeal, boiled egg, fruit  Lunch: pasta and sauce, vegetables  Dinner: chicken casserole and vegetables  Snacks: need to assess at follow up  Beverages/Fluid Intake: sugar free juice, water  Alcohol use: 8.0 standard drinks of alcohol/week   Nighttime eating? no  Additional details:  Areas for intervention: suggest label reading review and CGM Special Dietary Needs: Consistent Carbohydrate Diet NUTRITION DIAGNOSIS: Altered nutrition-related lab values related to endocrine dysfunction  as evidenced by elevated  A1C 9.7.  INTERVENTION: Completed nutrition assessment. Reviewed current diet/exercise. Provided  patient with handouts: how to dispose of sharps, ADA . Provided nutrition education on Diabetes  Topics.  Helped client create personalized goals.  Discussion Notes: weight is appropriate. patient is coping with diabetes well, tired of sticking his fingers.  Note follow up with [facility] in March/April and endocrine in June.   MONITORING/EVALUATION:  Patient Goals:  1. Diabetes monitoring:  a. A1C: Goal is <7.0. Patient is not currently at goal with last A1c of <7% b. BG: Goal is 80-130 (pre-prandial) or <180 (post-prandial). Patient states he is  currently at  goal with home BGs c. Weight: Goal is maintenance of current weight. Consider weight loss goal of 7% of  baseline weight when appropriate. d. CVD Risk: Goal is BP <130/80. Patient is not currently at goal with last BP of 135/84 .  2. Dietary changes:none today 1) Follow the plate method for balanced meals and snacks (  plate non-starchy  vegetables,  carbohydrate foods,  protein foods). 2) Consistent carbohydrate intake throughout the day. Aim to eat a small snack or meal  every 4-5 hours. 3. Fluid intake goal: drink eight 8oz glasses of water per day.   4. Exercise goal - At least 150 minutes/week or 30 minutes/day of aerobic exercise. Weight  training 2-3 times per week. Patient currently is meeting this exercise goal.   5. Additional Lifestyle goal: none  Follow up: 1 month  [name] Hole, CDCES 03/31/24

## 2024-03-31 NOTE — Telephone Encounter (Signed)
 Patient would like testing supplies, Continuous glucose monitoring supplies and lantus  refills sent to [facility] pharmacy. He also requested metformin  1000 mg tablets instead of 500 mg

## 2024-04-01 ENCOUNTER — Other Ambulatory Visit: Payer: Self-pay | Admitting: Student

## 2024-04-01 DIAGNOSIS — E139 Other specified diabetes mellitus without complications: Secondary | ICD-10-CM

## 2024-04-01 NOTE — Addendum Note (Signed)
 Addended by: FREDDI THEOPLIS BROCKS on: 04/01/2024 11:07 AM   Modules accepted: Orders

## 2024-04-29 ENCOUNTER — Ambulatory Visit: Payer: Self-pay | Admitting: Dietician

## 2024-05-06 ENCOUNTER — Ambulatory Visit: Admitting: Pharmacist

## 2024-07-29 ENCOUNTER — Ambulatory Visit: Admitting: Endocrinology

## 2024-09-29 ENCOUNTER — Other Ambulatory Visit

## 2024-10-13 ENCOUNTER — Encounter: Payer: Self-pay | Admitting: Infectious Disease

## 2024-10-13 ENCOUNTER — Ambulatory Visit: Payer: Self-pay
